# Patient Record
Sex: Female | Born: 1940 | Race: Black or African American | Hispanic: No | Smoking: Never smoker
Health system: Southern US, Community
[De-identification: ages and names within clinical notes are randomized; demographics above are authoritative.]

## PROBLEM LIST (undated history)

## (undated) DIAGNOSIS — K219 Gastro-esophageal reflux disease without esophagitis: Secondary | ICD-10-CM

## (undated) DIAGNOSIS — E785 Hyperlipidemia, unspecified: Secondary | ICD-10-CM

## (undated) DIAGNOSIS — I480 Paroxysmal atrial fibrillation: Secondary | ICD-10-CM

## (undated) DIAGNOSIS — M1990 Unspecified osteoarthritis, unspecified site: Secondary | ICD-10-CM

## (undated) DIAGNOSIS — G479 Sleep disorder, unspecified: Secondary | ICD-10-CM

## (undated) DIAGNOSIS — R569 Unspecified convulsions: Secondary | ICD-10-CM

## (undated) DIAGNOSIS — I10 Essential (primary) hypertension: Secondary | ICD-10-CM

## (undated) DIAGNOSIS — G709 Myoneural disorder, unspecified: Secondary | ICD-10-CM

## (undated) DIAGNOSIS — I639 Cerebral infarction, unspecified: Secondary | ICD-10-CM

## (undated) HISTORY — PX: LYMPH NODE BIOPSY: SHX201

## (undated) HISTORY — DX: Unspecified convulsions: R56.9

---

## 2012-11-22 DIAGNOSIS — K6389 Other specified diseases of intestine: Secondary | ICD-10-CM | POA: Diagnosis not present

## 2013-07-20 DIAGNOSIS — E119 Type 2 diabetes mellitus without complications: Secondary | ICD-10-CM | POA: Diagnosis not present

## 2013-07-20 DIAGNOSIS — E785 Hyperlipidemia, unspecified: Secondary | ICD-10-CM | POA: Diagnosis not present

## 2013-07-20 DIAGNOSIS — R5381 Other malaise: Secondary | ICD-10-CM | POA: Diagnosis not present

## 2013-07-20 DIAGNOSIS — E559 Vitamin D deficiency, unspecified: Secondary | ICD-10-CM | POA: Diagnosis not present

## 2013-07-20 DIAGNOSIS — Z23 Encounter for immunization: Secondary | ICD-10-CM | POA: Diagnosis not present

## 2013-07-20 DIAGNOSIS — R7402 Elevation of levels of lactic acid dehydrogenase (LDH): Secondary | ICD-10-CM | POA: Diagnosis not present

## 2013-07-20 DIAGNOSIS — M1990 Unspecified osteoarthritis, unspecified site: Secondary | ICD-10-CM | POA: Diagnosis not present

## 2013-07-20 DIAGNOSIS — E7800 Pure hypercholesterolemia, unspecified: Secondary | ICD-10-CM | POA: Diagnosis not present

## 2013-07-20 DIAGNOSIS — N390 Urinary tract infection, site not specified: Secondary | ICD-10-CM | POA: Diagnosis not present

## 2013-07-20 DIAGNOSIS — I10 Essential (primary) hypertension: Secondary | ICD-10-CM | POA: Diagnosis not present

## 2013-07-20 DIAGNOSIS — D649 Anemia, unspecified: Secondary | ICD-10-CM | POA: Diagnosis not present

## 2013-07-20 DIAGNOSIS — M25559 Pain in unspecified hip: Secondary | ICD-10-CM | POA: Diagnosis not present

## 2013-08-02 DIAGNOSIS — H6690 Otitis media, unspecified, unspecified ear: Secondary | ICD-10-CM | POA: Diagnosis not present

## 2013-08-02 DIAGNOSIS — R5381 Other malaise: Secondary | ICD-10-CM | POA: Diagnosis not present

## 2013-08-02 DIAGNOSIS — I10 Essential (primary) hypertension: Secondary | ICD-10-CM | POA: Diagnosis not present

## 2013-08-02 DIAGNOSIS — E782 Mixed hyperlipidemia: Secondary | ICD-10-CM | POA: Diagnosis not present

## 2013-08-02 DIAGNOSIS — H6120 Impacted cerumen, unspecified ear: Secondary | ICD-10-CM | POA: Diagnosis not present

## 2013-08-31 DIAGNOSIS — Z1231 Encounter for screening mammogram for malignant neoplasm of breast: Secondary | ICD-10-CM | POA: Diagnosis not present

## 2013-09-04 DIAGNOSIS — N6009 Solitary cyst of unspecified breast: Secondary | ICD-10-CM | POA: Diagnosis not present

## 2013-09-04 DIAGNOSIS — N63 Unspecified lump in unspecified breast: Secondary | ICD-10-CM | POA: Diagnosis not present

## 2014-03-11 DIAGNOSIS — Z13220 Encounter for screening for lipoid disorders: Secondary | ICD-10-CM | POA: Diagnosis not present

## 2014-03-11 DIAGNOSIS — M1610 Unilateral primary osteoarthritis, unspecified hip: Secondary | ICD-10-CM | POA: Diagnosis not present

## 2014-03-11 DIAGNOSIS — Z131 Encounter for screening for diabetes mellitus: Secondary | ICD-10-CM | POA: Diagnosis not present

## 2014-03-11 DIAGNOSIS — I10 Essential (primary) hypertension: Secondary | ICD-10-CM | POA: Diagnosis not present

## 2014-03-11 DIAGNOSIS — E785 Hyperlipidemia, unspecified: Secondary | ICD-10-CM | POA: Diagnosis not present

## 2014-06-10 DIAGNOSIS — M1610 Unilateral primary osteoarthritis, unspecified hip: Secondary | ICD-10-CM | POA: Diagnosis not present

## 2014-06-10 DIAGNOSIS — J3089 Other allergic rhinitis: Secondary | ICD-10-CM | POA: Diagnosis not present

## 2014-06-10 DIAGNOSIS — Z1239 Encounter for other screening for malignant neoplasm of breast: Secondary | ICD-10-CM | POA: Diagnosis not present

## 2014-06-10 DIAGNOSIS — I10 Essential (primary) hypertension: Secondary | ICD-10-CM | POA: Diagnosis not present

## 2014-06-17 ENCOUNTER — Other Ambulatory Visit: Payer: Self-pay

## 2014-06-17 DIAGNOSIS — Z1231 Encounter for screening mammogram for malignant neoplasm of breast: Secondary | ICD-10-CM

## 2014-09-06 DIAGNOSIS — Z23 Encounter for immunization: Secondary | ICD-10-CM | POA: Diagnosis not present

## 2014-10-08 ENCOUNTER — Ambulatory Visit
Admission: RE | Admit: 2014-10-08 | Discharge: 2014-10-08 | Disposition: A | Discharge status: Home / Self Care | Payer: Medicare Other | Source: Ambulatory Visit

## 2014-10-08 DIAGNOSIS — Z1231 Encounter for screening mammogram for malignant neoplasm of breast: Secondary | ICD-10-CM

## 2014-11-13 DIAGNOSIS — I10 Essential (primary) hypertension: Secondary | ICD-10-CM | POA: Diagnosis not present

## 2014-11-13 DIAGNOSIS — M1611 Unilateral primary osteoarthritis, right hip: Secondary | ICD-10-CM | POA: Diagnosis not present

## 2014-12-18 DIAGNOSIS — H8113 Benign paroxysmal vertigo, bilateral: Secondary | ICD-10-CM | POA: Diagnosis not present

## 2014-12-18 DIAGNOSIS — I10 Essential (primary) hypertension: Secondary | ICD-10-CM | POA: Diagnosis not present

## 2014-12-18 DIAGNOSIS — M1611 Unilateral primary osteoarthritis, right hip: Secondary | ICD-10-CM | POA: Diagnosis not present

## 2015-03-19 DIAGNOSIS — I10 Essential (primary) hypertension: Secondary | ICD-10-CM | POA: Diagnosis not present

## 2015-03-19 DIAGNOSIS — Z131 Encounter for screening for diabetes mellitus: Secondary | ICD-10-CM | POA: Diagnosis not present

## 2015-03-19 DIAGNOSIS — E784 Other hyperlipidemia: Secondary | ICD-10-CM | POA: Diagnosis not present

## 2015-03-19 DIAGNOSIS — J019 Acute sinusitis, unspecified: Secondary | ICD-10-CM | POA: Diagnosis not present

## 2015-03-19 DIAGNOSIS — M1611 Unilateral primary osteoarthritis, right hip: Secondary | ICD-10-CM | POA: Diagnosis not present

## 2015-03-31 DIAGNOSIS — M25511 Pain in right shoulder: Secondary | ICD-10-CM | POA: Diagnosis not present

## 2015-03-31 DIAGNOSIS — M25551 Pain in right hip: Secondary | ICD-10-CM | POA: Diagnosis not present

## 2015-03-31 DIAGNOSIS — M25561 Pain in right knee: Secondary | ICD-10-CM | POA: Diagnosis not present

## 2015-04-09 DIAGNOSIS — M25551 Pain in right hip: Secondary | ICD-10-CM | POA: Diagnosis not present

## 2015-07-10 DIAGNOSIS — M25511 Pain in right shoulder: Secondary | ICD-10-CM | POA: Diagnosis not present

## 2015-07-10 DIAGNOSIS — M25561 Pain in right knee: Secondary | ICD-10-CM | POA: Diagnosis not present

## 2015-07-10 DIAGNOSIS — M25551 Pain in right hip: Secondary | ICD-10-CM | POA: Diagnosis not present

## 2015-07-18 DIAGNOSIS — E784 Other hyperlipidemia: Secondary | ICD-10-CM | POA: Diagnosis not present

## 2015-07-18 DIAGNOSIS — I10 Essential (primary) hypertension: Secondary | ICD-10-CM | POA: Diagnosis not present

## 2015-07-18 DIAGNOSIS — M1611 Unilateral primary osteoarthritis, right hip: Secondary | ICD-10-CM | POA: Diagnosis not present

## 2015-08-05 NOTE — Progress Notes (Signed)
Requested release of orders in Epic surgery 08-22-15 pre op 08-14-15 Thanks

## 2015-08-12 NOTE — Patient Instructions (Addendum)
YOUR PROCEDURE IS SCHEDULED ON : 08/22/15  REPORT TO [facility] MAIN ENTRANCE FOLLOW SIGNS TO EAST ELEVATOR - GO TO 3rd FLOOR CHECK IN AT 3 EAST NURSES STATION (SHORT STAY) AT:  10:45 AM  CALL THIS NUMBER IF YOU HAVE PROBLEMS THE MORNING OF SURGERY [phone]  REMEMBER:ONLY 1 PER PERSON MAY GO TO SHORT STAY WITH YOU TO GET READY THE MORNING OF YOUR SURGERY  DO NOT EAT FOOD OR DRINK LIQUIDS AFTER MIDNIGHT  TAKE THESE MEDICINES THE MORNING OF SURGERY:CETRIZINE  YOU MAY NOT HAVE ANY METAL ON YOUR BODY INCLUDING HAIR PINS AND PIERCING'S. DO NOT WEAR JEWELRY, MAKEUP, LOTIONS, POWDERS OR PERFUMES. DO NOT WEAR NAIL POLISH. DO NOT SHAVE 48 HRS PRIOR TO SURGERY. MEN MAY SHAVE FACE AND NECK.  DO NOT BRING VALUABLES TO HOSPITAL. [facility] IS NOT RESPONSIBLE FOR VALUABLES.  CONTACTS, DENTURES OR PARTIALS MAY NOT BE WORN TO SURGERY. LEAVE SUITCASE IN CAR. CAN BE BROUGHT TO ROOM AFTER SURGERY.  PATIENTS DISCHARGED THE DAY OF SURGERY WILL NOT BE ALLOWED TO DRIVE HOME.  PLEASE READ OVER THE FOLLOWING INSTRUCTION SHEETS _________________________________________________________________________________                                          [place] - PREPARING FOR SURGERY  Before surgery, you can play an important role.  Because skin is not sterile, your skin needs to be as free of germs as possible.  You can reduce the number of germs on your skin by washing with CHG (chlorahexidine gluconate) soap before surgery.  CHG is an antiseptic cleaner which kills germs and bonds with the skin to continue killing germs even after washing. Please DO NOT use if you have an allergy to CHG or antibacterial soaps.  If your skin becomes reddened/irritated stop using the CHG and inform your nurse when you arrive at Short Stay. Do not shave (including legs and underarms) for at least 48 hours prior to the first CHG shower.  You may shave your face. Please follow these instructions  carefully:   1.  Shower with CHG Soap the night before surgery and the  morning of Surgery.   2.  If you choose to wash your hair, wash your hair first as usual with your  normal  Shampoo.   3.  After you shampoo, rinse your hair and body thoroughly to remove the  shampoo.                                         4.  Use CHG as you would any other liquid soap.  You can apply chg directly  to the skin and wash . Gently wash with scrungie or clean wascloth    5.  Apply the CHG Soap to your body ONLY FROM THE NECK DOWN.   Do not use on open                           Wound or open sores. Avoid contact with eyes, ears mouth and genitals (private parts).                        Genitals (private parts) with your normal soap.  6.  Wash thoroughly, paying special attention to the area where your surgery  will be performed.   7.  Thoroughly rinse your body with warm water from the neck down.   8.  DO NOT shower/wash with your normal soap after using and rinsing off  the CHG Soap .                9.  Pat yourself dry with a clean towel.             10.  Wear clean night clothes to bed after shower             11.  Place clean sheets on your bed the night of your first shower and do not  sleep with pets.  Day of Surgery : Do not apply any lotions/deodorants the morning of surgery.  Please wear clean clothes to the hospital/surgery center.  FAILURE TO FOLLOW THESE INSTRUCTIONS MAY RESULT IN THE CANCELLATION OF YOUR SURGERY    PATIENT SIGNATURE_________________________________  ______________________________________________________________________

## 2015-08-14 ENCOUNTER — Encounter (HOSPITAL_COMMUNITY)
Admission: RE | Admit: 2015-08-14 | Discharge: 2015-08-14 | Disposition: A | Discharge status: Home / Self Care | Payer: Medicare Other | Source: Ambulatory Visit | Attending: Orthopaedic Surgery | Admitting: Orthopaedic Surgery

## 2015-08-14 ENCOUNTER — Encounter (HOSPITAL_COMMUNITY): Payer: Self-pay

## 2015-08-14 DIAGNOSIS — Z01818 Encounter for other preprocedural examination: Secondary | ICD-10-CM | POA: Insufficient documentation

## 2015-08-14 DIAGNOSIS — M1611 Unilateral primary osteoarthritis, right hip: Secondary | ICD-10-CM | POA: Diagnosis not present

## 2015-08-14 HISTORY — DX: Gastro-esophageal reflux disease without esophagitis: K21.9

## 2015-08-14 HISTORY — DX: Sleep disorder, unspecified: G47.9

## 2015-08-14 HISTORY — DX: Hyperlipidemia, unspecified: E78.5

## 2015-08-14 HISTORY — DX: Essential (primary) hypertension: I10

## 2015-08-14 HISTORY — DX: Myoneural disorder, unspecified: G70.9

## 2015-08-14 LAB — BASIC METABOLIC PANEL
Anion gap: 7 (ref 5–15)
BUN: 26 mg/dL — AB (ref 6–20)
CO2: 28 mmol/L (ref 22–32)
CREATININE: 0.79 mg/dL (ref 0.44–1.00)
Calcium: 9.6 mg/dL (ref 8.9–10.3)
Chloride: 103 mmol/L (ref 101–111)
GFR calc Af Amer: >60 mL/min (ref >60)
GLUCOSE: 89 mg/dL (ref 65–99)
POTASSIUM: 3.9 mmol/L (ref 3.5–5.1)
SODIUM: 138 mmol/L (ref 135–145)

## 2015-08-14 LAB — CBC
HEMATOCRIT: 38.8 % (ref 36.0–46.0)
Hemoglobin: 12.2 g/dL (ref 12.0–15.0)
MCH: 27.7 pg (ref 26.0–34.0)
MCHC: 31.4 g/dL (ref 30.0–36.0)
MCV: 88.2 fL (ref 78.0–100.0)
PLATELETS: 205 10*3/uL (ref 150–400)
RBC: 4.4 MIL/uL (ref 3.87–5.11)
RDW: 13.7 % (ref 11.5–15.5)
WBC: 4.2 10*3/uL (ref 4.0–10.5)

## 2015-08-14 LAB — APTT: aPTT: 28 seconds (ref 24–37)

## 2015-08-14 LAB — PROTIME-INR
INR: 1.04 (ref 0.00–1.49)
Prothrombin Time: 13.8 seconds (ref 11.6–15.2)

## 2015-08-14 LAB — SURGICAL PCR SCREEN
MRSA, PCR: NEGATIVE
STAPHYLOCOCCUS AUREUS: NEGATIVE

## 2015-08-14 LAB — ABO/RH: ABO/RH(D): O POS

## 2015-08-22 ENCOUNTER — Encounter (HOSPITAL_COMMUNITY)
Admission: RE | Disposition: A | Discharge status: Skilled Nursing Facility | Payer: Self-pay | Source: Ambulatory Visit | Attending: Orthopaedic Surgery

## 2015-08-22 ENCOUNTER — Inpatient Hospital Stay (HOSPITAL_COMMUNITY): Payer: Medicare Other

## 2015-08-22 ENCOUNTER — Inpatient Hospital Stay (HOSPITAL_COMMUNITY): Payer: Medicare Other | Admitting: Certified Registered Nurse Anesthetist

## 2015-08-22 ENCOUNTER — Encounter (HOSPITAL_COMMUNITY): Payer: Self-pay | Admitting: *Deleted

## 2015-08-22 ENCOUNTER — Inpatient Hospital Stay (HOSPITAL_COMMUNITY)
Admission: RE | Admit: 2015-08-22 | Discharge: 2015-08-25 | DRG: 470 | Disposition: A | Discharge status: Skilled Nursing Facility | Payer: Medicare Other | Source: Ambulatory Visit | Attending: Orthopaedic Surgery | Admitting: Orthopaedic Surgery

## 2015-08-22 DIAGNOSIS — D62 Acute posthemorrhagic anemia: Secondary | ICD-10-CM | POA: Diagnosis not present

## 2015-08-22 DIAGNOSIS — I10 Essential (primary) hypertension: Secondary | ICD-10-CM | POA: Diagnosis present

## 2015-08-22 DIAGNOSIS — M1611 Unilateral primary osteoarthritis, right hip: Principal | ICD-10-CM

## 2015-08-22 DIAGNOSIS — Z96641 Presence of right artificial hip joint: Secondary | ICD-10-CM | POA: Diagnosis not present

## 2015-08-22 DIAGNOSIS — K219 Gastro-esophageal reflux disease without esophagitis: Secondary | ICD-10-CM | POA: Diagnosis present

## 2015-08-22 DIAGNOSIS — Z4789 Encounter for other orthopedic aftercare: Secondary | ICD-10-CM | POA: Diagnosis not present

## 2015-08-22 DIAGNOSIS — G709 Myoneural disorder, unspecified: Secondary | ICD-10-CM | POA: Diagnosis not present

## 2015-08-22 DIAGNOSIS — Z9889 Other specified postprocedural states: Secondary | ICD-10-CM | POA: Diagnosis not present

## 2015-08-22 DIAGNOSIS — E785 Hyperlipidemia, unspecified: Secondary | ICD-10-CM | POA: Diagnosis present

## 2015-08-22 DIAGNOSIS — R2681 Unsteadiness on feet: Secondary | ICD-10-CM | POA: Diagnosis not present

## 2015-08-22 DIAGNOSIS — E784 Other hyperlipidemia: Secondary | ICD-10-CM | POA: Diagnosis not present

## 2015-08-22 DIAGNOSIS — M6281 Muscle weakness (generalized): Secondary | ICD-10-CM | POA: Diagnosis not present

## 2015-08-22 DIAGNOSIS — Z01812 Encounter for preprocedural laboratory examination: Secondary | ICD-10-CM

## 2015-08-22 DIAGNOSIS — Z419 Encounter for procedure for purposes other than remedying health state, unspecified: Secondary | ICD-10-CM

## 2015-08-22 DIAGNOSIS — M25551 Pain in right hip: Secondary | ICD-10-CM | POA: Diagnosis not present

## 2015-08-22 DIAGNOSIS — M169 Osteoarthritis of hip, unspecified: Secondary | ICD-10-CM | POA: Diagnosis not present

## 2015-08-22 DIAGNOSIS — G4700 Insomnia, unspecified: Secondary | ICD-10-CM | POA: Diagnosis not present

## 2015-08-22 HISTORY — PX: TOTAL HIP ARTHROPLASTY: SHX124

## 2015-08-22 LAB — TYPE AND SCREEN
ABO/RH(D): O POS
ANTIBODY SCREEN: NEGATIVE

## 2015-08-22 SURGERY — ARTHROPLASTY, HIP, TOTAL, ANTERIOR APPROACH
Anesthesia: General | Site: Hip | Laterality: Right

## 2015-08-22 MED ORDER — LACTATED RINGERS IV SOLN
INTRAVENOUS | Status: DC
  Administered 2015-08-22: 1000 mL via INTRAVENOUS
  Administered 2015-08-22: 14:00:00 via INTRAVENOUS

## 2015-08-22 MED ORDER — MECLIZINE HCL 25 MG PO TABS
25.0000 mg | ORAL_TABLET | Freq: Three times a day (TID) | ORAL | Status: DC | PRN
  Filled 2015-08-22: qty 1

## 2015-08-22 MED ORDER — GLYCOPYRROLATE 0.2 MG/ML IJ SOLN
INTRAMUSCULAR | Status: DC | PRN
  Administered 2015-08-22: 0.4 mg via INTRAVENOUS

## 2015-08-22 MED ORDER — NEOSTIGMINE METHYLSULFATE 10 MG/10ML IV SOLN
INTRAVENOUS | Status: DC | PRN
  Administered 2015-08-22: 3 mg via INTRAVENOUS

## 2015-08-22 MED ORDER — LIDOCAINE HCL (CARDIAC) 20 MG/ML IV SOLN
INTRAVENOUS | Status: AC
  Filled 2015-08-22: qty 5

## 2015-08-22 MED ORDER — ZOLPIDEM TARTRATE 5 MG PO TABS: 5.0000 mg | ORAL_TABLET | Freq: Every evening | ORAL | Status: DC | PRN

## 2015-08-22 MED ORDER — METHOCARBAMOL 1000 MG/10ML IJ SOLN
500.0000 mg | Freq: Four times a day (QID) | INTRAVENOUS | Status: DC | PRN
  Administered 2015-08-22: 500 mg via INTRAVENOUS
  Filled 2015-08-22 (×2): qty 5

## 2015-08-22 MED ORDER — HYDROMORPHONE HCL 1 MG/ML IJ SOLN
0.2500 mg | INTRAMUSCULAR | Status: DC | PRN
  Administered 2015-08-22 (×4): 0.5 mg via INTRAVENOUS

## 2015-08-22 MED ORDER — METHOCARBAMOL 500 MG PO TABS
500.0000 mg | ORAL_TABLET | Freq: Four times a day (QID) | ORAL | Status: DC | PRN
  Administered 2015-08-24 – 2015-08-25 (×4): 500 mg via ORAL
  Filled 2015-08-22 (×5): qty 1

## 2015-08-22 MED ORDER — OXYCODONE HCL 5 MG PO TABS
5.0000 mg | ORAL_TABLET | ORAL | Status: DC | PRN
  Administered 2015-08-23 – 2015-08-25 (×7): 10 mg via ORAL
  Filled 2015-08-22 (×9): qty 2

## 2015-08-22 MED ORDER — DOCUSATE SODIUM 100 MG PO CAPS
100.0000 mg | ORAL_CAPSULE | Freq: Two times a day (BID) | ORAL | Status: DC
  Administered 2015-08-22 – 2015-08-25 (×6): 100 mg via ORAL

## 2015-08-22 MED ORDER — 0.9 % SODIUM CHLORIDE (POUR BTL) OPTIME
TOPICAL | Status: DC | PRN
  Administered 2015-08-22: 1000 mL

## 2015-08-22 MED ORDER — ALUM & MAG HYDROXIDE-SIMETH 200-200-20 MG/5ML PO SUSP
30.0000 mL | ORAL | Status: DC | PRN
  Administered 2015-08-24: 30 mL via ORAL
  Filled 2015-08-22: qty 30

## 2015-08-22 MED ORDER — ONDANSETRON HCL 4 MG/2ML IJ SOLN
4.0000 mg | Freq: Four times a day (QID) | INTRAMUSCULAR | Status: DC | PRN
  Administered 2015-08-25: 4 mg via INTRAVENOUS
  Filled 2015-08-22 (×2): qty 2

## 2015-08-22 MED ORDER — OXYCODONE HCL 5 MG PO TABS: 5.0000 mg | ORAL_TABLET | Freq: Once | ORAL | Status: DC | PRN

## 2015-08-22 MED ORDER — EPHEDRINE SULFATE 50 MG/ML IJ SOLN
INTRAMUSCULAR | Status: AC
  Filled 2015-08-22: qty 1

## 2015-08-22 MED ORDER — ROCURONIUM BROMIDE 100 MG/10ML IV SOLN
INTRAVENOUS | Status: AC
  Filled 2015-08-22: qty 1

## 2015-08-22 MED ORDER — DEXAMETHASONE SODIUM PHOSPHATE 10 MG/ML IJ SOLN
INTRAMUSCULAR | Status: DC | PRN
  Administered 2015-08-22: 10 mg via INTRAVENOUS

## 2015-08-22 MED ORDER — ONDANSETRON HCL 4 MG/2ML IJ SOLN
INTRAMUSCULAR | Status: AC
  Filled 2015-08-22: qty 2

## 2015-08-22 MED ORDER — LOSARTAN POTASSIUM-HCTZ 100-25 MG PO TABS: 1.0000 | ORAL_TABLET | Freq: Every morning | ORAL | Status: DC

## 2015-08-22 MED ORDER — ACETAMINOPHEN 325 MG PO TABS
650.0000 mg | ORAL_TABLET | Freq: Four times a day (QID) | ORAL | Status: DC | PRN
  Administered 2015-08-25 (×2): 650 mg via ORAL
  Filled 2015-08-22 (×2): qty 2

## 2015-08-22 MED ORDER — KETOROLAC TROMETHAMINE 15 MG/ML IJ SOLN
7.5000 mg | Freq: Four times a day (QID) | INTRAMUSCULAR | Status: AC
  Administered 2015-08-22 – 2015-08-23 (×4): 7.5 mg via INTRAVENOUS
  Filled 2015-08-22 (×4): qty 1

## 2015-08-22 MED ORDER — METOCLOPRAMIDE HCL 10 MG PO TABS: 5.0000 mg | ORAL_TABLET | Freq: Three times a day (TID) | ORAL | Status: DC | PRN

## 2015-08-22 MED ORDER — EPHEDRINE SULFATE 50 MG/ML IJ SOLN
INTRAMUSCULAR | Status: DC | PRN
  Administered 2015-08-22 (×2): 10 mg via INTRAVENOUS

## 2015-08-22 MED ORDER — DEXAMETHASONE SODIUM PHOSPHATE 10 MG/ML IJ SOLN
INTRAMUSCULAR | Status: AC
  Filled 2015-08-22: qty 1

## 2015-08-22 MED ORDER — NEOSTIGMINE METHYLSULFATE 10 MG/10ML IV SOLN
INTRAVENOUS | Status: AC
  Filled 2015-08-22: qty 1

## 2015-08-22 MED ORDER — TRANEXAMIC ACID 1000 MG/10ML IV SOLN
1000.0000 mg | INTRAVENOUS | Status: AC
  Administered 2015-08-22: 1000 mg via INTRAVENOUS
  Filled 2015-08-22: qty 10

## 2015-08-22 MED ORDER — HYDROMORPHONE HCL 1 MG/ML IJ SOLN
1.0000 mg | INTRAMUSCULAR | Status: DC | PRN
Start: 2015-08-22 — End: 2015-08-25
  Administered 2015-08-22 – 2015-08-23 (×3): 1 mg via INTRAVENOUS
  Filled 2015-08-22 (×3): qty 1

## 2015-08-22 MED ORDER — SODIUM CHLORIDE 0.9 % IV SOLN
INTRAVENOUS | Status: DC
  Administered 2015-08-22 – 2015-08-23 (×2): via INTRAVENOUS

## 2015-08-22 MED ORDER — ONDANSETRON HCL 4 MG PO TABS: 4.0000 mg | ORAL_TABLET | Freq: Four times a day (QID) | ORAL | Status: DC | PRN

## 2015-08-22 MED ORDER — VITAMIN E 180 MG (400 UNIT) PO CAPS
400.0000 [IU] | ORAL_CAPSULE | Freq: Every day | ORAL | Status: DC
  Administered 2015-08-22 – 2015-08-25 (×4): 400 [IU] via ORAL
  Filled 2015-08-22 (×4): qty 1

## 2015-08-22 MED ORDER — FENTANYL CITRATE (PF) 100 MCG/2ML IJ SOLN
INTRAMUSCULAR | Status: DC | PRN
Start: 2015-08-22 — End: 2015-08-22
  Administered 2015-08-22: 50 ug via INTRAVENOUS
  Administered 2015-08-22 (×2): 100 ug via INTRAVENOUS

## 2015-08-22 MED ORDER — SUCCINYLCHOLINE CHLORIDE 20 MG/ML IJ SOLN
INTRAMUSCULAR | Status: DC | PRN
  Administered 2015-08-22: 100 mg via INTRAVENOUS

## 2015-08-22 MED ORDER — CEFAZOLIN SODIUM-DEXTROSE 2-3 GM-% IV SOLR
INTRAVENOUS | Status: AC
  Filled 2015-08-22: qty 50

## 2015-08-22 MED ORDER — CEFAZOLIN SODIUM-DEXTROSE 2-3 GM-% IV SOLR
2.0000 g | INTRAVENOUS | Status: AC
  Administered 2015-08-22: 2 g via INTRAVENOUS

## 2015-08-22 MED ORDER — SODIUM CHLORIDE 0.9 % IR SOLN
Status: DC | PRN
  Administered 2015-08-22: 1000 mL

## 2015-08-22 MED ORDER — PROPOFOL 10 MG/ML IV BOLUS
INTRAVENOUS | Status: DC | PRN
  Administered 2015-08-22: 150 mg via INTRAVENOUS

## 2015-08-22 MED ORDER — ONDANSETRON HCL 4 MG/2ML IJ SOLN: 4.0000 mg | Freq: Once | INTRAMUSCULAR | Status: DC | PRN

## 2015-08-22 MED ORDER — DIPHENHYDRAMINE HCL 12.5 MG/5ML PO ELIX
12.5000 mg | ORAL_SOLUTION | ORAL | Status: DC | PRN
  Administered 2015-08-22 – 2015-08-25 (×2): 12.5 mg via ORAL
  Filled 2015-08-22 (×2): qty 5

## 2015-08-22 MED ORDER — FENTANYL CITRATE (PF) 250 MCG/5ML IJ SOLN
INTRAMUSCULAR | Status: AC
  Filled 2015-08-22: qty 25

## 2015-08-22 MED ORDER — HYDROMORPHONE HCL 1 MG/ML IJ SOLN
INTRAMUSCULAR | Status: DC | PRN
  Administered 2015-08-22 (×2): 1 mg via INTRAVENOUS

## 2015-08-22 MED ORDER — OXYCODONE HCL 5 MG/5ML PO SOLN
5.0000 mg | Freq: Once | ORAL | Status: DC | PRN
  Filled 2015-08-22: qty 5

## 2015-08-22 MED ORDER — SODIUM CHLORIDE 0.9 % IJ SOLN
INTRAMUSCULAR | Status: AC
  Filled 2015-08-22: qty 10

## 2015-08-22 MED ORDER — GLYCOPYRROLATE 0.2 MG/ML IJ SOLN
INTRAMUSCULAR | Status: AC
  Filled 2015-08-22: qty 2

## 2015-08-22 MED ORDER — CEFAZOLIN SODIUM 1-5 GM-% IV SOLN
1.0000 g | Freq: Four times a day (QID) | INTRAVENOUS | Status: AC
  Administered 2015-08-22 (×2): 1 g via INTRAVENOUS
  Filled 2015-08-22 (×2): qty 50

## 2015-08-22 MED ORDER — ACETAMINOPHEN 650 MG RE SUPP: 650.0000 mg | Freq: Four times a day (QID) | RECTAL | Status: DC | PRN

## 2015-08-22 MED ORDER — PROPOFOL 10 MG/ML IV BOLUS
INTRAVENOUS | Status: AC
  Filled 2015-08-22: qty 20

## 2015-08-22 MED ORDER — METOCLOPRAMIDE HCL 5 MG/ML IJ SOLN: 5.0000 mg | Freq: Three times a day (TID) | INTRAMUSCULAR | Status: DC | PRN

## 2015-08-22 MED ORDER — ONDANSETRON HCL 4 MG/2ML IJ SOLN
INTRAMUSCULAR | Status: DC | PRN
  Administered 2015-08-22: 4 mg via INTRAVENOUS

## 2015-08-22 MED ORDER — HYDROMORPHONE HCL 2 MG/ML IJ SOLN
INTRAMUSCULAR | Status: AC
  Filled 2015-08-22: qty 1

## 2015-08-22 MED ORDER — LOSARTAN POTASSIUM 50 MG PO TABS
100.0000 mg | ORAL_TABLET | Freq: Every day | ORAL | Status: DC
  Administered 2015-08-22 – 2015-08-25 (×4): 100 mg via ORAL
  Filled 2015-08-22 (×4): qty 2

## 2015-08-22 MED ORDER — MENTHOL 3 MG MT LOZG: 1.0000 | LOZENGE | OROMUCOSAL | Status: DC | PRN

## 2015-08-22 MED ORDER — LIDOCAINE HCL (CARDIAC) 20 MG/ML IV SOLN
INTRAVENOUS | Status: DC | PRN
  Administered 2015-08-22: 100 mg via INTRAVENOUS

## 2015-08-22 MED ORDER — HYDROMORPHONE HCL 1 MG/ML IJ SOLN
INTRAMUSCULAR | Status: AC
  Filled 2015-08-22: qty 1

## 2015-08-22 MED ORDER — POLYETHYLENE GLYCOL 3350 17 G PO PACK
17.0000 g | PACK | Freq: Every day | ORAL | Status: DC | PRN
  Administered 2015-08-24: 17 g via ORAL
  Filled 2015-08-22: qty 1

## 2015-08-22 MED ORDER — INFLUENZA VAC SPLIT QUAD 0.5 ML IM SUSY
0.5000 mL | PREFILLED_SYRINGE | INTRAMUSCULAR | Status: AC
  Administered 2015-08-23: 0.5 mL via INTRAMUSCULAR
  Filled 2015-08-22 (×2): qty 0.5

## 2015-08-22 MED ORDER — HYDROCHLOROTHIAZIDE 25 MG PO TABS
25.0000 mg | ORAL_TABLET | Freq: Every day | ORAL | Status: DC
  Administered 2015-08-22 – 2015-08-25 (×4): 25 mg via ORAL
  Filled 2015-08-22 (×4): qty 1

## 2015-08-22 MED ORDER — ROCURONIUM BROMIDE 100 MG/10ML IV SOLN
INTRAVENOUS | Status: DC | PRN
  Administered 2015-08-22: 30 mg via INTRAVENOUS

## 2015-08-22 MED ORDER — PHENOL 1.4 % MT LIQD
1.0000 | OROMUCOSAL | Status: DC | PRN
  Filled 2015-08-22: qty 177

## 2015-08-22 MED ORDER — VITAMIN D3 25 MCG (1000 UNIT) PO TABS
1000.0000 [IU] | ORAL_TABLET | Freq: Every day | ORAL | Status: DC
  Administered 2015-08-22 – 2015-08-25 (×4): 1000 [IU] via ORAL
  Filled 2015-08-22 (×4): qty 1

## 2015-08-22 MED ORDER — ASPIRIN EC 325 MG PO TBEC
325.0000 mg | DELAYED_RELEASE_TABLET | Freq: Two times a day (BID) | ORAL | Status: DC
  Administered 2015-08-22 – 2015-08-25 (×6): 325 mg via ORAL
  Filled 2015-08-22 (×9): qty 1

## 2015-08-22 MED ORDER — HYDROMORPHONE HCL 1 MG/ML IJ SOLN
INTRAMUSCULAR | Status: AC
  Administered 2015-08-23: 1 mg via INTRAVENOUS
  Filled 2015-08-22: qty 1

## 2015-08-22 SURGICAL SUPPLY — 41 items
BAG ZIPLOCK 12X15 (MISCELLANEOUS) IMPLANT
BENZOIN TINCTURE PRP APPL 2/3 (GAUZE/BANDAGES/DRESSINGS) IMPLANT
BLADE SAW SGTL 18X1.27X75 (BLADE) ×2 IMPLANT
BLADE SAW SGTL 18X1.27X75MM (BLADE) ×1
CAPT HIP TOTAL 2 ×3 IMPLANT
CELLS DAT CNTRL 66122 CELL SVR (MISCELLANEOUS) ×1 IMPLANT
CLOSURE WOUND 1/2 X4 (GAUZE/BANDAGES/DRESSINGS)
COVER PERINEAL POST (MISCELLANEOUS) ×3 IMPLANT
DRAPE C-ARM 42X120 X-RAY (DRAPES) ×3 IMPLANT
DRAPE STERI IOBAN 125X83 (DRAPES) ×3 IMPLANT
DRAPE U-SHAPE 47X51 STRL (DRAPES) ×9 IMPLANT
DRSG AQUACEL AG ADV 3.5X10 (GAUZE/BANDAGES/DRESSINGS) ×3 IMPLANT
DURAPREP 26ML APPLICATOR (WOUND CARE) ×3 IMPLANT
ELECT BLADE TIP CTD 4 INCH (ELECTRODE) ×3 IMPLANT
ELECT REM PT RETURN 9FT ADLT (ELECTROSURGICAL) ×3
ELECTRODE REM PT RTRN 9FT ADLT (ELECTROSURGICAL) ×1 IMPLANT
FACESHIELD WRAPAROUND (MASK) ×12 IMPLANT
GAUZE XEROFORM 1X8 LF (GAUZE/BANDAGES/DRESSINGS) ×3 IMPLANT
GLOVE BIO SURGEON STRL SZ7.5 (GLOVE) ×3 IMPLANT
GLOVE BIOGEL PI IND STRL 8 (GLOVE) ×2 IMPLANT
GLOVE BIOGEL PI INDICATOR 8 (GLOVE) ×4
GLOVE ECLIPSE 8.0 STRL XLNG CF (GLOVE) ×3 IMPLANT
GOWN STRL REUS W/TWL XL LVL3 (GOWN DISPOSABLE) ×6 IMPLANT
HANDPIECE INTERPULSE COAX TIP (DISPOSABLE) ×2
KIT BASIN OR (CUSTOM PROCEDURE TRAY) ×3 IMPLANT
PACK TOTAL JOINT (CUSTOM PROCEDURE TRAY) ×3 IMPLANT
PEN SKIN MARKING BROAD (MISCELLANEOUS) ×3 IMPLANT
RTRCTR WOUND ALEXIS 18CM MED (MISCELLANEOUS) ×3
SET HNDPC FAN SPRY TIP SCT (DISPOSABLE) ×1 IMPLANT
STAPLER VISISTAT 35W (STAPLE) ×3 IMPLANT
STRIP CLOSURE SKIN 1/2X4 (GAUZE/BANDAGES/DRESSINGS) IMPLANT
SUT ETHIBOND NAB CT1 #1 30IN (SUTURE) ×3 IMPLANT
SUT MNCRL AB 4-0 PS2 18 (SUTURE) IMPLANT
SUT VIC AB 0 CT1 36 (SUTURE) ×3 IMPLANT
SUT VIC AB 1 CT1 36 (SUTURE) ×3 IMPLANT
SUT VIC AB 2-0 CT1 27 (SUTURE) ×4
SUT VIC AB 2-0 CT1 TAPERPNT 27 (SUTURE) ×2 IMPLANT
TOWEL OR 17X26 10 PK STRL BLUE (TOWEL DISPOSABLE) ×3 IMPLANT
TOWEL OR NON WOVEN STRL DISP B (DISPOSABLE) ×3 IMPLANT
TRAY FOLEY W/METER SILVER 14FR (SET/KITS/TRAYS/PACK) ×3 IMPLANT
YANKAUER SUCT BULB TIP 10FT TU (MISCELLANEOUS) ×3 IMPLANT

## 2015-08-22 NOTE — Anesthesia Postprocedure Evaluation (Signed)
  Anesthesia Post-op Note  Patient: Diana Bush  Procedure(s) Performed: Procedure(s) (LRB): RIGHT TOTAL HIP ARTHROPLASTY ANTERIOR APPROACH (Right)  Patient Location: PACU  Anesthesia Type: General  Level of Consciousness: awake and alert   Airway and Oxygen Therapy: Patient Spontanous Breathing  Post-op Pain: mild  Post-op Assessment: Post-op Vital signs reviewed, Patient's Cardiovascular Status Stable, Respiratory Function Stable, Patent Airway and No signs of Nausea or vomiting  Last Vitals:  Filed Vitals:   08/22/15 1515  BP: 158/78  Pulse: 56  Temp: 36.4 C  Resp: 12    Post-op Vital Signs: stable   Complications: No apparent anesthesia complications

## 2015-08-22 NOTE — Transfer of Care (Signed)
Immediate Anesthesia Transfer of Care Note  Patient: Diana ColonelMary Dolbow  Procedure(s) Performed: Procedure(s): RIGHT TOTAL HIP ARTHROPLASTY ANTERIOR APPROACH (Right)  Patient Location: PACU  Anesthesia Type:General  Level of Consciousness:  sedated, patient cooperative and responds to stimulation  Airway & Oxygen Therapy:Patient Spontanous Breathing and Patient connected to face mask oxgen  Post-op Assessment:  Report given to PACU RN and Post -op Vital signs reviewed and stable  Post vital signs:  Reviewed and stable  Last Vitals:  Filed Vitals:   08/22/15 1416  BP:   Pulse:   Temp: 36.4 C  Resp:     Complications: No apparent anesthesia complications

## 2015-08-22 NOTE — Anesthesia Procedure Notes (Signed)
Procedure Name: Intubation Date/Time: 08/22/2015 12:45 PM Performed by: Orest DikesPETERS, Arin Vanosdol J Pre-anesthesia Checklist: Patient identified, Emergency Drugs available, Suction available and Patient being monitored Patient Re-evaluated:Patient Re-evaluated prior to inductionOxygen Delivery Method: Circle System Utilized Preoxygenation: Pre-oxygenation with 100% oxygen Intubation Type: IV induction Ventilation: Mask ventilation without difficulty Laryngoscope Size: Mac and 4 Grade View: Grade I Tube type: Oral Tube size: 7.5 mm Number of attempts: 1 Airway Equipment and Method: Stylet and Oral airway Placement Confirmation: ETT inserted through vocal cords under direct vision,  positive ETCO2 and breath sounds checked- equal and bilateral Secured at: 21 cm Tube secured with: Tape Dental Injury: Teeth and Oropharynx as per pre-operative assessment

## 2015-08-22 NOTE — H&P (Signed)
TOTAL HIP ADMISSION H&P  Patient is admitted for right total hip arthroplasty.  Subjective:  Chief Complaint: right hip pain  HPI: Diana Bush, 74 y.o. female, has a history of pain and functional disability in the right hip(s) due to arthritis and patient has failed non-surgical conservative treatments for greater than 12 weeks to include NSAID's and/or analgesics, corticosteriod injections, flexibility and strengthening excercises, use of assistive devices, weight reduction as appropriate and activity modification.  Onset of symptoms was gradual starting 3 years ago with gradually worsening course since that time.The patient noted no past surgery on the right hip(s).  Patient currently rates pain in the right hip at 10 out of 10 with activity. Patient has night pain, worsening of pain with activity and weight bearing, trendelenberg gait, pain that interfers with activities of daily living and pain with passive range of motion. Patient has evidence of subchondral cysts, subchondral sclerosis, periarticular osteophytes and joint space narrowing by imaging studies. This condition presents safety issues increasing the risk of falls.  There is no current active infection.  Patient Active Problem List   Diagnosis Date Noted  . Osteoarthritis of right hip 08/22/2015   Past Medical History  Diagnosis Date  . Hypertension   . Borderline hyperlipidemia   . Neuromuscular disorder (HCC)   . GERD (gastroesophageal reflux disease)   . Difficulty sleeping     DUE TO PAIN    Past Surgical History  Procedure Laterality Date  . Lymph node biopsy      BENIGN    No prescriptions prior to admission   No Known Allergies  Social History  Substance Use Topics  . Smoking status: Never Smoker   . Smokeless tobacco: Not on file  . Alcohol Use: No    No family history on file.   Review of Systems  Musculoskeletal: Positive for joint pain.  All other systems reviewed and are  negative.   Objective:  Physical Exam  Constitutional: She is oriented to person, place, and time. She appears well-developed and well-nourished.  HENT:  Head: Normocephalic and atraumatic.  Eyes: EOM are normal. Pupils are equal, round, and reactive to light.  Neck: Normal range of motion. Neck supple.  Cardiovascular: Normal rate and regular rhythm.   Respiratory: Effort normal and breath sounds normal.  GI: Soft. Bowel sounds are normal.  Musculoskeletal:       Right hip: She exhibits decreased range of motion, decreased strength, tenderness, bony tenderness and crepitus.  Neurological: She is alert and oriented to person, place, and time.  Skin: Skin is warm and dry.  Psychiatric: She has a normal mood and affect.    Vital signs in last 24 hours:    Labs:   There is no height or weight on file to calculate BMI.   Imaging Review Plain radiographs demonstrate severe degenerative joint disease of the right hip(s). The bone quality appears to be good for age and reported activity level.  Assessment/Plan:  End stage arthritis, right hip(s)  The patient history, physical examination, clinical judgement of the provider and imaging studies are consistent with end stage degenerative joint disease of the right hip(s) and total hip arthroplasty is deemed medically necessary. The treatment options including medical management, injection therapy, arthroscopy and arthroplasty were discussed at length. The risks and benefits of total hip arthroplasty were presented and reviewed. The risks due to aseptic loosening, infection, stiffness, dislocation/subluxation,  thromboembolic complications and other imponderables were discussed.  The patient acknowledged the explanation, agreed to proceed  with the plan and consent was signed. Patient is being admitted for inpatient treatment for surgery, pain control, PT, OT, prophylactic antibiotics, VTE prophylaxis, progressive ambulation and ADL's and  discharge planning.The patient is planning to be discharged home with home health services

## 2015-08-22 NOTE — Anesthesia Preprocedure Evaluation (Addendum)
Anesthesia Evaluation  Patient identified by MRN, date of birth, ID band Patient awake    Reviewed: Allergy & Precautions, H&P , NPO status , Patient's Chart, lab work & pertinent test results  History of Anesthesia Complications Negative for: history of anesthetic complications  Airway Mallampati: II  TM Distance: >3 FB Neck ROM: full    Dental no notable dental hx.    Pulmonary neg pulmonary ROS,    Pulmonary exam normal breath sounds clear to auscultation       Cardiovascular hypertension, Pt. on medications Normal cardiovascular exam Rhythm:regular Rate:Normal     Neuro/Psych  Neuromuscular disease    GI/Hepatic Neg liver ROS, GERD  ,  Endo/Other  negative endocrine ROS  Renal/GU negative Renal ROS     Musculoskeletal  (+) Arthritis ,   Abdominal (+) + obese,   Peds  Hematology negative hematology ROS (+)   Anesthesia Other Findings   Reproductive/Obstetrics negative OB ROS                            Anesthesia Physical Anesthesia Plan  ASA: II  Anesthesia Plan: General   Post-op Pain Management:    Induction: Intravenous  Airway Management Planned: LMA  Additional Equipment:   Intra-op Plan:   Post-operative Plan: Extubation in OR  Informed Consent: I have reviewed the patients History and Physical, chart, labs and discussed the procedure including the risks, benefits and alternatives for the proposed anesthesia with the patient or authorized representative who has indicated his/her understanding and acceptance.   Dental Advisory Given  Plan Discussed with: Anesthesiologist, CRNA and Surgeon  Anesthesia Plan Comments: (Patient declines spinal with no desire to hear risks or benefits of the various anesthesia techniques)       Anesthesia Quick Evaluation

## 2015-08-22 NOTE — Brief Op Note (Signed)
08/22/2015  1:53 PM  PATIENT:  Janann ColonelMary Haglund  74 y.o. female  PRE-OPERATIVE DIAGNOSIS:  Severe osteoarthritis right hip  POST-OPERATIVE DIAGNOSIS:  Severe osteoarthritis right hip  PROCEDURE:  Procedure(s): RIGHT TOTAL HIP ARTHROPLASTY ANTERIOR APPROACH (Right)  SURGEON:  Surgeon(s) and Role:    * Kathryne Hitchhristopher Y Maricela Schreur, MD - Primary  PHYSICIAN ASSISTANT: Rexene EdisonGil Clark, PA-C  ANESTHESIA:   general  EBL:  Total I/O In: 1000 [I.V.:1000] Out: 550 [Urine:250; Blood:300]  BLOOD ADMINISTERED:none  DRAINS: none   LOCAL MEDICATIONS USED:  NONE  SPECIMEN:  No Specimen  DISPOSITION OF SPECIMEN:  N/A  COUNTS:  YES  TOURNIQUET:  * No tourniquets in log *  DICTATION: .Other Dictation: Dictation Number [id number]  PLAN OF CARE: Admit to inpatient   PATIENT DISPOSITION:  PACU - hemodynamically stable.   Delay start of Pharmacological VTE agent (>24hrs) due to surgical blood loss or risk of bleeding: no

## 2015-08-22 NOTE — Progress Notes (Signed)
Utilization review completed.  

## 2015-08-23 DIAGNOSIS — M1611 Unilateral primary osteoarthritis, right hip: Secondary | ICD-10-CM | POA: Diagnosis not present

## 2015-08-23 LAB — CBC
HCT: 30.1 % — ABNORMAL LOW (ref 36.0–46.0)
HEMOGLOBIN: 9.5 g/dL — AB (ref 12.0–15.0)
MCH: 27.4 pg (ref 26.0–34.0)
MCHC: 31.6 g/dL (ref 30.0–36.0)
MCV: 86.7 fL (ref 78.0–100.0)
Platelets: 173 10*3/uL (ref 150–400)
RBC: 3.47 MIL/uL — AB (ref 3.87–5.11)
RDW: 13.7 % (ref 11.5–15.5)
WBC: 8.4 10*3/uL (ref 4.0–10.5)

## 2015-08-23 LAB — BASIC METABOLIC PANEL WITH GFR
Anion gap: 5 (ref 5–15)
BUN: 19 mg/dL (ref 6–20)
CO2: 28 mmol/L (ref 22–32)
Calcium: 8.8 mg/dL — ABNORMAL LOW (ref 8.9–10.3)
Chloride: 104 mmol/L (ref 101–111)
Creatinine, Ser: 0.79 mg/dL (ref 0.44–1.00)
GFR calc Af Amer: >60 mL/min
GFR calc non Af Amer: >60 mL/min
Glucose, Bld: 130 mg/dL — ABNORMAL HIGH (ref 65–99)
Potassium: 4.1 mmol/L (ref 3.5–5.1)
Sodium: 137 mmol/L (ref 135–145)

## 2015-08-23 NOTE — Progress Notes (Signed)
Occupational Therapy Evaluation Patient Details Name: Diana Bush MRN: [id number] DOB: 03-09-1941 Today's Date: 08/23/2015    History of Present Illness R THR    Clinical Impression   Patient presents to OT with decreased ADL independence and safety s/p R THR. 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 OT will follow.    Follow Up Recommendations  SNF;Supervision/Assistance - 24 hour ; Home Health OT   Equipment Recommendations  None recommended by OT    Recommendations for Other Services PT consult     Precautions / Restrictions Precautions Precautions: Fall Restrictions Weight Bearing Restrictions: No Other Position/Activity Restrictions: WBAT      Mobility Bed Mobility              Transfers                 Balance                                            ADL Overall ADL's : Needs assistance/impaired Eating/Feeding: Independent;Sitting                                     General ADL Comments: Patient up in recliner eating lunch. She vomited x 1 during evaluation. RN made aware. Patient asked OT to return in a few minutes to work on OOB activities.      Vision     Perception     Praxis      Pertinent Vitals/Pain Pain Assessment: 0-10 Pain Score: 5  Pain Location: R hip Pain Descriptors / Indicators: Aching;Sore Pain Intervention(s): Limited activity within patient's tolerance;Monitored during session     Hand Dominance Right   Extremity/Trunk Assessment Upper Extremity Assessment Upper Extremity Assessment: Overall WFL for tasks assessed         Communication Communication Communication: No difficulties   Cognition Arousal/Alertness: Awake/alert Behavior During Therapy: WFL for tasks assessed/performed Overall Cognitive Status: Within Functional Limits for tasks assessed                      General Comments       Exercises      Shoulder Instructions      Home Living Family/patient expects to be discharged to:: Private residence Living Arrangements: Spouse/significant other Available Help at Discharge: Family Type of Home: House Home Access: Stairs to enter Secretary/administrator of Steps: 5 Entrance Stairs-Rails: Right;Left Home Layout: One level     Bathroom Shower/Tub: Producer, television/film/video: Handicapped height Bathroom Accessibility: Yes How Accessible: Accessible via walker Home Equipment: Cane - single point;Toilet riser;Bedside commode;Shower seat - built in   Additional Comments: Pt is at home with spouse who is disabled and not able to offer physical assist.  Pt's dtr states, "mom actually is looking after him right now"      Prior Functioning/Environment Level of Independence: Independent             OT Diagnosis: Generalized weakness;Acute pain   OT Problem List: Decreased strength;Decreased range of motion;Decreased activity tolerance;Decreased knowledge of use of DME or AE;Pain   OT Treatment/Interventions: Self-care/ADL training;DME and/or AE instruction;Therapeutic activities;Patient/family education    OT Goals(Current goals can be found in the  care plan section) Acute Rehab OT Goals Patient Stated Goal: decrease pain OT Goal Formulation: With patient Time For Goal Achievement: 09/06/15 Potential to Achieve Goals: Good  OT Frequency: Min 2X/week   Barriers to D/C: Other (comment) (patient is primary caregiver for her husband who is disabled)          Co-evaluation              End of Session Nurse Communication: Other (comment) (pt vomited x 1)  Activity Tolerance: Patient tolerated treatment well Patient left: in chair;with call bell/phone within reach   Time: 1215-1230 OT Time Calculation (min): 15 min Charges:  OT General Charges $OT Visit: 1 Procedure OT Evaluation $Initial OT Evaluation Tier I: 1  Procedure G-Codes:    [name] A 08/23/2015, 1:55 PM

## 2015-08-23 NOTE — Evaluation (Signed)
Physical Therapy Evaluation Patient Details Name: Diana Bush MRN: [id number] DOB: 1941/02/21 Today's Date: 08/23/2015   History of Present Illness  R THR   Clinical Impression  Pt s/p R THR presents with decreased R LE strength/ROM and post op pain limiting functional mobility.  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   Follow Up Recommendations Home health PT;SNF    Equipment Recommendations  Rolling walker with 5" wheels    Recommendations for Other Services OT consult     Precautions / Restrictions Precautions Precautions: Fall Restrictions Weight Bearing Restrictions: No Other Position/Activity Restrictions: WBAT      Mobility  Bed Mobility Overal bed mobility: Needs Assistance Bed Mobility: Supine to Sit     Supine to sit: Min assist;Mod assist     General bed mobility comments: cues for sequence and use of L LE to self assist  Transfers Overall transfer level: Needs assistance Equipment used: Rolling walker (2 wheeled) Transfers: Sit to/from Stand Sit to Stand: Min assist         General transfer comment: cues for LE management and use of UEs to self assist  Ambulation/Gait Ambulation/Gait assistance: Min assist;Mod assist Ambulation Distance (Feet): 56 Feet Assistive device: Rolling walker (2 wheeled) Gait Pattern/deviations: Step-to pattern;Decreased step length - right;Decreased step length - left;Shuffle;Trunk flexed Gait velocity: decr Gait velocity interpretation: Below normal speed for age/gender General Gait Details: cues for posture, position from RW and initial sequence  Stairs            Wheelchair Mobility    Modified Rankin (Stroke Patients Only)       Balance                                             Pertinent Vitals/Pain Pain Assessment: 0-10 Pain Score: 4  Pain Location: R  hip Pain Descriptors / Indicators: Aching;Sore Pain Intervention(s): Limited activity within patient's tolerance;Monitored during session;Premedicated before session;Ice applied    Home Living Family/patient expects to be discharged to:: Private residence Living Arrangements: Spouse/significant other Available Help at Discharge: Family Type of Home: House Home Access: Stairs to enter Entrance Stairs-Rails: Doctor, general practiceight;Left Entrance Stairs-Number of Steps: 5 Home Layout: One level Home Equipment: Cane - single point Additional Comments: Pt is at home with spouse who is disabled and not able to offer physical assist.  Pt's dtr states, "mom actually is looking after him right now"    Prior Function Level of Independence: Independent               Hand Dominance        Extremity/Trunk Assessment   Upper Extremity Assessment: Overall WFL for tasks assessed           Lower Extremity Assessment: RLE deficits/detail RLE Deficits / Details: 2+/5 strength at hip with AAROM at hip to 80 flex and 15 abd    Cervical / Trunk Assessment: Normal  Communication   Communication: No difficulties  Cognition Arousal/Alertness: Awake/alert Behavior During Therapy: WFL for tasks assessed/performed Overall Cognitive Status: Within Functional Limits for tasks assessed                      General Comments      Exercises Total Joint Exercises Ankle Circles/Pumps:  AROM;Both;15 reps;Supine Quad Sets: AROM;Both;10 reps;Supine Heel Slides: AAROM;Right;15 reps;Supine Hip ABduction/ADduction: AAROM;Right;10 reps;Supine      Assessment/Plan    PT Assessment Patient needs continued PT services  PT Diagnosis Difficulty walking   PT Problem List Decreased strength;Decreased range of motion;Decreased activity tolerance;Decreased mobility;Decreased knowledge of use of DME;Pain;Obesity;Decreased knowledge of precautions  PT Treatment Interventions DME instruction;Gait training;Stair  training;Functional mobility training;Therapeutic activities;Therapeutic exercise;Patient/family education   PT Goals (Current goals can be found in the Care Plan section) Acute Rehab PT Goals Patient Stated Goal: Walk without pain PT Goal Formulation: With patient Time For Goal Achievement: 08/29/15 Potential to Achieve Goals: Good    Frequency 7X/week   Barriers to discharge Decreased caregiver support Pt is caregiver for disabled spouse - per dtr    Co-evaluation               End of Session Equipment Utilized During Treatment: Gait belt Activity Tolerance: Patient tolerated treatment well Patient left: in chair;with call bell/phone within reach Nurse Communication: Mobility status         Time: 1610-9604 PT Time Calculation (min) (ACUTE ONLY): 32 min   Charges:   PT Evaluation $Initial PT Evaluation Tier I: 1 Procedure PT Treatments $Therapeutic Exercise: 8-22 mins   PT G Codes:        Madhavi Hamblen 09/05/15, 12:39 PM

## 2015-08-23 NOTE — Care Management Note (Signed)
Case Management Note  Patient Details  Name: Diana Bush MRN: [id number] Date of Birth: August 09, 1941  Subjective/Objective:     Right total hip arthroplasty                Action/Plan: [facility] pt [facility] Pt states she has 3n1 at home. Requesting RW for home. Contacted [facility] for RW for home.   Expected Discharge Date:  08/23/2015               Expected Discharge Plan:  Home w Home Health Services  In-House Referral:     Discharge planning Services     Post Acute Care Choice:  Home Health Choice offered to:  Patient  DME Arranged:  Walker rolling DME Agency:  [facility]  [facility]  Status of Service:  Completed, signed off  Medicare Important Message Given:    Date Medicare IM Given:    Medicare IM give by:    Date Additional Medicare IM Given:    Additional Medicare Important Message give by:     If discussed at Long Length of Stay Meetings, dates discussed:    Additional Comments:  Elliot CousinShavis, Shaylen Nephew Ellen, RN 08/23/2015, 11:07 AM

## 2015-08-23 NOTE — Progress Notes (Signed)
Physical Therapy Treatment Patient Details Name: Diana Bush MRN: [id number] DOB: 1941-02-23 Today's Date: 08/23/2015    History of Present Illness R THR     PT Comments      Follow Up Recommendations  Home health PT;SNF     Equipment Recommendations  Rolling walker with 5" wheels    Recommendations for Other Services OT consult     Precautions / Restrictions Precautions Precautions: Fall Restrictions Weight Bearing Restrictions: No Other Position/Activity Restrictions: WBAT    Mobility  Bed Mobility Overal bed mobility: Needs Assistance Bed Mobility: Sit to Supine       Sit to supine: Min assist;Mod assist   General bed mobility comments: cues for sequence and use of L LE to self assist  Transfers Overall transfer level: Needs assistance Equipment used: Rolling walker (2 wheeled) Transfers: Sit to/from Stand Sit to Stand: Min guard         General transfer comment: cues for LE management and use of UEs to self assist  Ambulation/Gait Ambulation/Gait assistance: Min assist Ambulation Distance (Feet): 88 Feet Assistive device: Rolling walker (2 wheeled) Gait Pattern/deviations: Step-to pattern;Decreased step length - right;Decreased step length - left;Shuffle;Trunk flexed Gait velocity: decr   General Gait Details: cues for posture, position from RW and initial sequence   Stairs            Wheelchair Mobility    Modified Rankin (Stroke Patients Only)       Balance                                    Cognition Arousal/Alertness: Awake/alert Behavior During Therapy: WFL for tasks assessed/performed Overall Cognitive Status: Within Functional Limits for tasks assessed                      Exercises      General Comments        Pertinent Vitals/Pain Pain Assessment: 0-10 Pain Score: 6  Pain Location: R hip Pain Descriptors / Indicators: Aching;Sore Pain Intervention(s): Limited activity within patient's  tolerance;Monitored during session;Premedicated before session;Ice applied    Home Living Family/patient expects to be discharged to:: Private residence Living Arrangements: Spouse/significant other Available Help at Discharge: Family Type of Home: House Home Access: Stairs to enter Entrance Stairs-Rails: Right;Left Home Layout: One level Home Equipment: Cane - single point;Toilet riser;Bedside commode;Shower seat - built in Additional Comments: Pt is at home with spouse who is disabled and not able to offer physical assist.  Pt's dtr states, "mom actually is looking after him right now"    Prior Function Level of Independence: Independent          PT Goals (current goals can now be found in the care plan section) Acute Rehab PT Goals Patient Stated Goal: decrease pain PT Goal Formulation: With patient Time For Goal Achievement: 08/29/15 Potential to Achieve Goals: Good Progress towards PT goals: Progressing toward goals    Frequency  7X/week    PT Plan Current plan remains appropriate    Co-evaluation             End of Session Equipment Utilized During Treatment: Gait belt Activity Tolerance: Patient tolerated treatment well Patient left: in bed;with call bell/phone within reach     Time: 1340-1403 PT Time Calculation (min) (ACUTE ONLY): 23 min  Charges:  $Gait Training: 23-37 mins  G Codes:      Diana Bush 2015/09/12, 3:35 PM

## 2015-08-23 NOTE — Progress Notes (Signed)
Occupational Therapy Treatment Patient Details Name: Diana Bush MRN: [id number] DOB: Mar 22, 1941 Today's Date: 08/23/2015    History of present illness R THR    OT comments  Patient progressing towards OT goals; was able to work on performance of ADLs this session. Has increased pain and RN giving pain medications at end of session. OT will continue to follow.  Follow Up Recommendations  SNF;Supervision/Assistance - 24 hour ; Home Health OT   Equipment Recommendations  None recommended by OT    Recommendations for Other Services PT consult    Precautions / Restrictions Precautions Precautions: Fall Restrictions Weight Bearing Restrictions: No Other Position/Activity Restrictions: WBAT       Mobility Bed Mobility              Transfers Overall transfer level: Needs assistance Equipment used: Rolling walker (2 wheeled) Transfers: Sit to/from Stand Sit to Stand: Min guard             Balance                                   ADL Overall ADL's : Needs assistance/impaired Eating/Feeding: Independent;Sitting   Grooming: Wash/dry hands;Min guard;Standing   Upper Body Bathing: Minimal assitance;Sitting   Lower Body Bathing: Minimal assistance;Sit to/from stand   Upper Body Dressing : Minimal assistance;Sitting   Lower Body Dressing: Minimal assistance;Sit to/from stand   Toilet Transfer: Min guard;Ambulation;Regular Toilet;Grab bars;RW   Toileting- Architect and Hygiene: Min guard;Sit to/from stand       Functional mobility during ADLs: Min guard;Rolling walker General ADL Comments: Patient wanted to perform sponge bathing. Performed sponge bathing sit to stand from EOB with min A overall. Patient min A to don gown. Ambulated to and from bathroom with RW and min guard A. Min guard A with all aspects toileting as well as standing at sink to wash hands. Patient left up in recliner with nurse at bedside to give pain  medication.      Vision                     Perception     Praxis      Cognition   Behavior During Therapy: WFL for tasks assessed/performed Overall Cognitive Status: Within Functional Limits for tasks assessed                       Extremity/Trunk Assessment  Upper Extremity Assessment Upper Extremity Assessment: Overall WFL for tasks assessed   Lower Extremity Assessment Lower Extremity Assessment: Defer to PT evaluation RLE Deficits / Details: 2+/5 strength at hip with AAROM at hip to 80 flex and 15 abd   Cervical / Trunk Assessment Cervical / Trunk Assessment: Normal    Exercises    Shoulder Instructions       General Comments      Pertinent Vitals/ Pain       Pain Assessment: 0-10 Pain Score: 7  Pain Location: R hip Pain Descriptors / Indicators: Aching;Sore Pain Intervention(s): Limited activity within patient's tolerance;Monitored during session;Patient requesting pain meds-RN notified  Home Living Family/patient expects to be discharged to:: Private residence Living Arrangements: Spouse/significant other Available Help at Discharge: Family Type of Home: House Home Access: Stairs to enter Secretary/administrator of Steps: 5 Entrance Stairs-Rails: Right;Left Home Layout: One level     Bathroom Shower/Tub: Producer, television/film/video: Handicapped height Bathroom Accessibility: Yes How  Accessible: Accessible via walker Home Equipment: Cane - single point;Toilet riser;Bedside commode;Shower seat - built in   Additional Comments: Pt is at home with spouse who is disabled and not able to offer physical assist.  Pt's dtr states, "mom actually is looking after him right now"      Prior Functioning/Environment Level of Independence: Independent            Frequency Min 2X/week     Progress Toward Goals  OT Goals(current goals can now be found in the care plan section)  Progress towards OT goals: Progressing toward  goals  Acute Rehab OT Goals Patient Stated Goal: decrease pain OT Goal Formulation: With patient Time For Goal Achievement: 09/06/15 Potential to Achieve Goals: Good ADL Goals Pt Will Perform Lower Body Bathing: with modified independence;sit to/from stand Pt Will Perform Lower Body Dressing: with modified independence;sit to/from stand Pt Will Transfer to Toilet: with modified independence;ambulating;bedside commode Pt Will Perform Toileting - Clothing Manipulation and hygiene: with modified independence;sit to/from stand Pt Will Perform Tub/Shower Transfer: with modified independence;Shower transfer;rolling walker;shower seat  Plan Discharge plan remains appropriate    Co-evaluation                 End of Session Equipment Utilized During Treatment: Rolling walker   Activity Tolerance Patient tolerated treatment well   Patient Left in chair;with call bell/phone within reach;with nursing/sitter in room   Nurse Communication Patient requests pain meds        Time: 1248-1315 OT Time Calculation (min): 27 min  Charges: OT General Charges $OT Visit: 1 Procedure OT Treatments $Self Care/Home Management : 23-37 mins  Diana Bush A 08/23/2015, 2:01 PM

## 2015-08-23 NOTE — Progress Notes (Signed)
Subjective: 1 Day Post-Op Procedure(s) (LRB): RIGHT TOTAL HIP ARTHROPLASTY ANTERIOR APPROACH (Right) Patient reports pain as moderate.  Awake and alert.  Acute blood loss anemia from surgery, but asymptomatic thus far.  Objective: Vital signs in last 24 hours: Temp:  [97.5 F (36.4 C)-99.6 F (37.6 C)] 99.6 F (37.6 C) (10/15 0622) Pulse Rate:  [56-78] 69 (10/15 0622) Resp:  [8-18] 16 (10/15 0622) BP: (122-167)/(61-99) 122/61 mmHg (10/15 0622) SpO2:  [92 %-100 %] 99 % (10/15 0622) Weight:  [80.06 kg (176 lb 8 oz)] 80.06 kg (176 lb 8 oz) (10/14 1043)  Intake/Output from previous day: 10/14 0701 - 10/15 0700 In: 2580 [P.O.:420; I.V.:2000; IV Piggyback:160] Out: 1325 [Urine:1025; Blood:300] Intake/Output this shift: Total I/O In: 1213.8 [I.V.:1213.8] Out: 300 [Urine:300]   Recent Labs  08/23/15 0455  HGB 9.5*    Recent Labs  08/23/15 0455  WBC 8.4  RBC 3.47*  HCT 30.1*  PLT 173    Recent Labs  08/23/15 0455  NA 137  K 4.1  CL 104  CO2 28  BUN 19  CREATININE 0.79  GLUCOSE 130*  CALCIUM 8.8*   No results for input(s): LABPT, INR in the last 72 hours.  Sensation intact distally Intact pulses distally Dorsiflexion/Plantar flexion intact Incision: dressing C/D/I  Assessment/Plan: 1 Day Post-Op Procedure(s) (LRB): RIGHT TOTAL HIP ARTHROPLASTY ANTERIOR APPROACH (Right) Up with therapy  Diana Bush Y 08/23/2015, 7:34 AM

## 2015-08-23 NOTE — Op Note (Signed)
NAMEMarland Kitchen  Diana Bush, Diana Bush NO.:  [id number]  MEDICAL RECORD NO.:  [id number]  LOCATION:  1621                         FACILITY:  [facility]  PHYSICIAN:  Vanita Panda. 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  PROCEDURE:  Right total hip arthroplasty through direct anterior approach.  IMPLANTS:  DePuy Sector Gription acetabular component size 50, size 32+ 0 polyethylene liner, size 10 Corail femoral component with varus offset, size 36+ 1 ceramic hip ball.  SURGEON:  Vanita Panda. Magnus Ivan, M.D.  ASSISTANT:  Richardean Canal, PA-C.  ANESTHESIA:  General.  ANTIBIOTICS:  2 g of IV Ancef.  BLOOD LOSS:  300 mL.  COMPLICATIONS:  None.  INDICATIONS:  Diana Bush is a 74 year old female well known to me.  She has debilitating arthritis involving her right hip with x-ray showing complete loss of her right hip joint space, periarticular osteophytes, cystic changes and sclerotic changes.  Her pain is daily.  It is greatly and detrimentally effected her activities of daily living, mobility and her quality of life.  At this point, with failure of all forms of conservative treatment, she wished to proceed with a total hip arthroplasty through direct anterior approach.  She understands the risk of acute blood loss anemia, nerve and vessel injury, fracture, infection, dislocation, DVT.  She understands her goals are decreased pain, improved mobility, and overall improved quality of life.  PROCEDURE DESCRIPTION:  After informed consent was obtained, appropriate right hip was marked.  She was brought to the operating room and general anesthesia was obtained while she was on her stretcher.  A  Foley catheter was placed and then both feet had traction boots applied to all them.  Next, she was placed supine on the Hana fracture table with a perineal post in place and both legs in inline skeletal traction devices, but no traction applied.  Her right operative hip was then prepped and draped with DuraPrep and sterile drapes.  A time-out was called and she was identified as correct patient and correct right hip. We then made an incision inferior and posterior to the anterior superior iliac spine and carried this obliquely down the leg.  We dissected down the tensor fascia lata muscle and tensor fascia was then divided longitudinally so we could proceed with a direct anterior approach to the hip.  We identified and cauterized the lateral femoral circumflex vessels and then identified the hip capsule and placed Cobra retractors around the medial and lateral femoral neck and then opened up the hip capsule in L-type format finding a large joint effusion.  We placed our Cobra retractors within the hip capsule and then made our femoral neck cut with an oscillating saw proximal to the lesser trochanter and completed this with an osteotome.  We placed a corkscrew guide in the femoral head and removed the femoral head in it's entirety and found it to be devoid of cartilage.  We then cleaned  the acetabulum and remnants of the acetabular labrum and placed a bent Hohmann over the medial acetabular rim.  We then began reaming in 2-mm increments from size a 42 up to a size 50 with all reamers under direct visualization and last reamer under direct fluoroscopy, so I could obtain my depth of reaming, my inclination and anteversion.  Once we were done with that, we placed the real acetabular component from [employer]DePuy Sector Gription size 50.  We placed this under direct fluoroscopy again, so we could obtain our depth and inclination and anteversion.  We then placed the real 32+ 0 neutral polyethylene  liner.  Attention was then turned to the femur.  With the leg externally rotated to 100 degrees, extended and abducted, we were able to use a box cutting osteotome to enter the femoral canal and a rongeur to lateralize; however, we were able to place a Mueller retractor medially and a Hohmann retractor behind the greater trochanter.  We released the lateral joint capsule.  We then began broaching from a size 8 broach using the Corail broaching system up to a size 10.  With the size 10, we placed a varus offset femoral neck based on her anatomy and a 32+ 1 hip ball.  We brought the leg back over and up with traction and internal rotation, reducing the pelvis.  We were pleased with range of motion, leg length and offset.  We then dislocated the hip and removed the trial components.  We then placed the real DePuy Sector Gription acetabular component size 10 with varus offset and the real 32+ 1 ceramic hip ball.  We reduced this in the acetabulum.  We were pleased with stability.  We then copiously irrigated the soft tissues with normal saline solution using pulsatile lavage.  We then were able to close the joint capsule with interrupted #1 Ethibond suture followed by running #1 Vicryl in the tensor fascia, 0 Vicryl in the deep tissue, 2-0 Vicryl in the subcutaneous tissue, staples on the skin, Xeroform and well-padded sterile dressing.  She was then taken off the Hana table, awakened, extubated and taken to the recovery room in stable condition.  All final counts were correct.  There were no complications noted.  Of note, Richardean CanalGilbert Clark, PA-C assisted in entire case and his assistance was crucial for facilitating all aspects of this case and he was present the entire time.     Vanita Pandahristopher Y. Magnus IvanBlackman, M.D.     CYB/MEDQ  D:  08/22/2015  T:  08/23/2015  Job:  [id number]

## 2015-08-24 LAB — CBC
HCT: 29.7 % — ABNORMAL LOW (ref 36.0–46.0)
Hemoglobin: 9.5 g/dL — ABNORMAL LOW (ref 12.0–15.0)
MCH: 27.6 pg (ref 26.0–34.0)
MCHC: 32 g/dL (ref 30.0–36.0)
MCV: 86.3 fL (ref 78.0–100.0)
PLATELETS: 161 10*3/uL (ref 150–400)
RBC: 3.44 MIL/uL — ABNORMAL LOW (ref 3.87–5.11)
RDW: 13.9 % (ref 11.5–15.5)
WBC: 6.9 10*3/uL (ref 4.0–10.5)

## 2015-08-24 MED ORDER — LORATADINE 10 MG PO TABS
10.0000 mg | ORAL_TABLET | Freq: Every day | ORAL | Status: DC
  Administered 2015-08-24 – 2015-08-25 (×2): 10 mg via ORAL
  Filled 2015-08-24 (×2): qty 1

## 2015-08-24 MED ORDER — GUAIFENESIN 100 MG/5ML PO SOLN
30.0000 mL | ORAL | Status: DC | PRN
  Administered 2015-08-24: 600 mg via ORAL
  Filled 2015-08-24: qty 30

## 2015-08-24 NOTE — Progress Notes (Signed)
Physical Therapy Treatment Patient Details Name: Diana Bush MRN: [id number] DOB: Dec 24, 1940 Today's Date: 08/24/2015    History of Present Illness R THR     PT Comments    Pt progressing slowly with mobility and with questionable safety awareness.  Follow Up Recommendations        Equipment Recommendations       Recommendations for Other Services       Precautions / Restrictions Precautions Precautions: Fall Restrictions Weight Bearing Restrictions: No Other Position/Activity Restrictions: WBAT    Mobility  Bed Mobility Overal bed mobility: Needs Assistance Bed Mobility: Supine to Sit     Supine to sit: Min assist;Mod assist     General bed mobility comments: cues for sequence and use of L LE to self assist; physical assist to manage R LE and to bring trunk to upright  Transfers Overall transfer level: Needs assistance Equipment used: Rolling walker (2 wheeled) Transfers: Sit to/from Stand Sit to Stand: Min guard;From elevated surface         General transfer comment: cues for LE management and use of UEs to self assist  Ambulation/Gait Ambulation/Gait assistance: Min assist;Min guard Ambulation Distance (Feet): 88 Feet Assistive device: Rolling walker (2 wheeled) Gait Pattern/deviations: Step-to pattern;Step-through pattern;Decreased step length - right;Decreased step length - left;Shuffle;Trunk flexed Gait velocity: decr   General Gait Details: cues for posture, position from RW and initial sequence   Stairs            Wheelchair Mobility    Modified Rankin (Stroke Patients Only)       Balance                                    Cognition Arousal/Alertness: Awake/alert Behavior During Therapy: WFL for tasks assessed/performed Overall Cognitive Status: Within Functional Limits for tasks assessed                      Exercises Total Joint Exercises Ankle Circles/Pumps: AROM;Both;15 reps;Supine Quad Sets:  AROM;Both;10 reps;Supine Heel Slides: AAROM;Right;15 reps;Supine Hip ABduction/ADduction: AAROM;Right;10 reps;Supine    General Comments        Pertinent Vitals/Pain Pain Assessment: 0-10 Pain Score: 5  Pain Location: R hip Pain Descriptors / Indicators: Aching;Sore Pain Intervention(s): Limited activity within patient's tolerance;Monitored during session;Premedicated before session;Ice applied    Home Living                      Prior Function            PT Goals (current goals can now be found in the care plan section) Acute Rehab PT Goals Patient Stated Goal: decrease pain    Frequency       PT Plan      Co-evaluation             End of Session           Time: [id number] PT Time Calculation (min) (ACUTE ONLY): 31 min  Charges:                       G Codes:      Diana Bush 08/24/2015, 12:38 PM

## 2015-08-24 NOTE — Progress Notes (Signed)
Pt c/o chest congestion & cough, productive cough with moderate amt thick tenacious sputum, green-tinged yellow in color. Page placed to Dr Magnus IvanBlackman. Ysabelle Goodroe, Bed Bath [employer] Beyondaylor

## 2015-08-24 NOTE — Discharge Instructions (Signed)

## 2015-08-24 NOTE — Progress Notes (Signed)
Subjective: 2 Days Post-Op Procedure(s) (LRB): RIGHT TOTAL HIP ARTHROPLASTY ANTERIOR APPROACH (Right) Patient reports pain as moderate.  H/H stable.  Does not feel well and making slow progress with therapy.  Objective: Vital signs in last 24 hours: Temp:  [98.1 F (36.7 C)-100 F (37.8 C)] 99.1 F (37.3 C) (10/16 0626) Pulse Rate:  [68-95] 73 (10/16 0626) Resp:  [16-18] 16 (10/16 0626) BP: (138-179)/(62-99) 138/63 mmHg (10/16 0626) SpO2:  [98 %-99 %] 98 % (10/16 0626)  Intake/Output from previous day: 10/15 0701 - 10/16 0700 In: 1762.5 [P.O.:60; I.V.:1702.5] Out: 750 [Urine:750] Intake/Output this shift:     Recent Labs  08/23/15 0455 08/24/15 0612  HGB 9.5* 9.5*    Recent Labs  08/23/15 0455 08/24/15 0612  WBC 8.4 6.9  RBC 3.47* 3.44*  HCT 30.1* 29.7*  PLT 173 161    Recent Labs  08/23/15 0455  NA 137  K 4.1  CL 104  CO2 28  BUN 19  CREATININE 0.79  GLUCOSE 130*  CALCIUM 8.8*   No results for input(s): LABPT, INR in the last 72 hours.  Sensation intact distally Intact pulses distally Dorsiflexion/Plantar flexion intact Incision: scant drainage  Assessment/Plan: 2 Days Post-Op Procedure(s) (LRB): RIGHT TOTAL HIP ARTHROPLASTY ANTERIOR APPROACH (Right) Up with therapy Plan for discharge tomorrow - home vs SNF  Diana Bush Y 08/24/2015, 8:53 AM

## 2015-08-24 NOTE — Progress Notes (Signed)
Physical Therapy Treatment Patient Details Name: Diana Bush MRN: [id number] DOB: May 18, 1941 Today's Date: 08/24/2015    History of Present Illness R THR     PT Comments    Pt progressing steadily with mobility but continues to struggle with in/out bed and would benefit from follow up rehab at SNF level to maximize IND and safety prior to return home with disabled spouse and ltd assistance.  Follow Up Recommendations  SNF     Equipment Recommendations  Rolling walker with 5" wheels    Recommendations for Other Services OT consult     Precautions / Restrictions Precautions Precautions: Fall Restrictions Weight Bearing Restrictions: No Other Position/Activity Restrictions: WBAT    Mobility  Bed Mobility Overal bed mobility: Needs Assistance Bed Mobility: Supine to Sit;Sit to Supine     Supine to sit: Min assist;Mod assist Sit to supine: Min assist   General bed mobility comments: cues for sequence and use of L LE to self assist; physical assist to manage R LE and to bring trunk to upright  Transfers Overall transfer level: Needs assistance Equipment used: Rolling walker (2 wheeled) Transfers: Sit to/from Stand Sit to Stand: From elevated surface;Min assist         General transfer comment: cues for LE management and use of UEs to self assist; Physical assist to bring wt up and fwd and to balance on LEs initially  Ambulation/Gait Ambulation/Gait assistance: Min assist Ambulation Distance (Feet): 74 Feet (twice and 15' from bathroom) Assistive device: Rolling walker (2 wheeled) Gait Pattern/deviations: Step-to pattern;Step-through pattern;Decreased step length - right;Decreased step length - left;Shuffle;Trunk flexed Gait velocity: decr   General Gait Details: cues for posture, position from RW and initial sequence   Stairs            Wheelchair Mobility    Modified Rankin (Stroke Patients Only)       Balance                                     Cognition Arousal/Alertness: Awake/alert Behavior During Therapy: WFL for tasks assessed/performed Overall Cognitive Status: Within Functional Limits for tasks assessed                      Exercises      General Comments        Pertinent Vitals/Pain Pain Assessment: 0-10 Pain Score: 5  Pain Location: R hip Pain Descriptors / Indicators: Aching;Sore Pain Intervention(s): Limited activity within patient's tolerance;Monitored during session;Premedicated before session;Ice applied    Home Living                      Prior Function            PT Goals (current goals can now be found in the care plan section) Acute Rehab PT Goals Patient Stated Goal: decrease pain PT Goal Formulation: With patient Time For Goal Achievement: 08/29/15 Potential to Achieve Goals: Good Progress towards PT goals: Progressing toward goals    Frequency  7X/week    PT Plan Discharge plan needs to be updated    Co-evaluation             End of Session Equipment Utilized During Treatment: Gait belt Activity Tolerance: Patient tolerated treatment well Patient left: in bed;with call bell/phone within reach;with family/visitor present     Time: 1420-1450 PT Time Calculation (min) (ACUTE ONLY): 30 min  Charges:  $Gait Training: 23-37 mins                    G Codes:      Diana Bush 08/24/2015, 4:32 PM

## 2015-08-25 ENCOUNTER — Encounter (HOSPITAL_COMMUNITY): Payer: Self-pay | Admitting: Orthopaedic Surgery

## 2015-08-25 DIAGNOSIS — E785 Hyperlipidemia, unspecified: Secondary | ICD-10-CM | POA: Diagnosis not present

## 2015-08-25 DIAGNOSIS — M6281 Muscle weakness (generalized): Secondary | ICD-10-CM | POA: Diagnosis not present

## 2015-08-25 DIAGNOSIS — G709 Myoneural disorder, unspecified: Secondary | ICD-10-CM | POA: Diagnosis not present

## 2015-08-25 DIAGNOSIS — M25551 Pain in right hip: Secondary | ICD-10-CM | POA: Diagnosis not present

## 2015-08-25 DIAGNOSIS — Z471 Aftercare following joint replacement surgery: Secondary | ICD-10-CM | POA: Diagnosis not present

## 2015-08-25 DIAGNOSIS — G4700 Insomnia, unspecified: Secondary | ICD-10-CM | POA: Diagnosis not present

## 2015-08-25 DIAGNOSIS — R2681 Unsteadiness on feet: Secondary | ICD-10-CM | POA: Diagnosis not present

## 2015-08-25 DIAGNOSIS — M1611 Unilateral primary osteoarthritis, right hip: Secondary | ICD-10-CM | POA: Diagnosis not present

## 2015-08-25 DIAGNOSIS — K219 Gastro-esophageal reflux disease without esophagitis: Secondary | ICD-10-CM | POA: Diagnosis not present

## 2015-08-25 DIAGNOSIS — I10 Essential (primary) hypertension: Secondary | ICD-10-CM | POA: Diagnosis not present

## 2015-08-25 DIAGNOSIS — D649 Anemia, unspecified: Secondary | ICD-10-CM | POA: Diagnosis not present

## 2015-08-25 DIAGNOSIS — E784 Other hyperlipidemia: Secondary | ICD-10-CM | POA: Diagnosis not present

## 2015-08-25 DIAGNOSIS — Z4789 Encounter for other orthopedic aftercare: Secondary | ICD-10-CM | POA: Diagnosis not present

## 2015-08-25 DIAGNOSIS — M169 Osteoarthritis of hip, unspecified: Secondary | ICD-10-CM | POA: Diagnosis not present

## 2015-08-25 DIAGNOSIS — Z96641 Presence of right artificial hip joint: Secondary | ICD-10-CM | POA: Diagnosis not present

## 2015-08-25 MED ORDER — METHOCARBAMOL 500 MG PO TABS: 500.0000 mg | ORAL_TABLET | Freq: Four times a day (QID) | ORAL | Status: DC | PRN

## 2015-08-25 MED ORDER — OXYCODONE-ACETAMINOPHEN 5-325 MG PO TABS: 1.0000 | ORAL_TABLET | ORAL | Status: DC | PRN

## 2015-08-25 MED ORDER — ASPIRIN 325 MG PO TBEC: 325.0000 mg | DELAYED_RELEASE_TABLET | Freq: Two times a day (BID) | ORAL | Status: DC

## 2015-08-25 NOTE — Progress Notes (Signed)
[facility]  CSW facilitated pt discharge needs including contacting facility, faxing pt discharge information via [facility], discussing with pt at bedside, providing RN phone number to call report, and arranging ambulance transport for pt to [facility].   Pt coping appropriately with transition to SNF.   No further social work needs identified at this time.  CSW signing off.   Loletta SpecterSuzanna Erice Ahles, MSW, LCSW Clinical Social Work [phone]

## 2015-08-25 NOTE — Clinical Social Work Note (Signed)
Clinical Social Work Assessment  Patient Details  Name: Diana Bush MRN: [id number] Date of Birth: August 22, 1941  Date of referral:  08/25/15               Reason for consult:  Discharge Planning                Permission sought to share information with:  Family Supports Permission granted to share information::  Yes, Verbal Permission Granted  Name::     Radiah Lubinski  Agency::     Relationship::  daughter  Contact Information:  [phone]  Housing/Transportation Living arrangements for the past 2 months:  Single Family Home Source of Information:  Patient Patient Interpreter Needed:  None Criminal Activity/Legal Involvement Pertinent to Current Situation/Hospitalization:  No - Comment as needed Significant Relationships:  Adult Children, Spouse Lives with:  Spouse Do you feel safe going back to the place where you live?  No Need for family participation in patient care:  No (Coment)  Care giving concerns:  Pt admitted from home with spouse with hip fracture. PT recommending SNF.    Social Worker assessment / plan:  CSW received referral for new SNF.  CSW met with pt at bedside. CSW introduced self and explained role. Pt discussed that she lives at home with pt spouse. Pt aware of need for rehab at SNF. CSW discussed process of SNF search and pt agreeable to [facility] search.  CSW completed FL2 and initiated SNF search to [facility].   CSW followed up with pt at bedside to provide SNF bed offers. Pt chooses bed at [facility]. CSW contacted [facility] and confirmed bed availability today. [facility] request transport be arranged for 2:30 pm.   CSW to facilitate pt discharge needs this afternoon.   Employment status:  Retired Forensic scientist:  Medicare PT Recommendations:  [place] / Referral to community resources:  [place]  Patient/Family's Response to care:  Pt alert  and oriented x 4. Pt discussed that she is not very familiar with SNF facilities as pt just moved two years ago to [place] [facility] from [place]. Pt grateful to have something close to her home for her short term rehab needs.  Patient/Family's Understanding of and Emotional Response to Diagnosis, Current Treatment, and Prognosis:  Pt aware of discharge today and agreeable to [facility] for short term rehab.  Emotional Assessment Appearance:  Appears stated age Attitude/Demeanor/Rapport:  Other (pt appropriate) Affect (typically observed):  Accepting, Adaptable Orientation:  Oriented to Self, Oriented to Place, Oriented to  Time, Oriented to Situation Alcohol / Substance use:  Not Applicable Psych involvement (Current and /or in the community):  No (Comment)  Discharge Needs  Concerns to be addressed:  Discharge Planning Concerns Readmission within the last 30 days:  No Current discharge risk:  None Barriers to Discharge:  No Barriers Identified   [place], [place], LCSW 08/25/2015, 1:03 PM  [phone]

## 2015-08-25 NOTE — Care Management Important Message (Signed)
Important Message  Patient Details  Name: Diana Bush MRN: [id number] Date of Birth: 08-Sep-1941   Medicare Important Message Given:  Yes-second notification given    Haskell FlirtJamison, Debralee Braaksma 08/25/2015, 1:06 PMImportant Message  Patient Details  Name: Diana Bush MRN: [id number] Date of Birth: 08-Sep-1941   Medicare Important Message Given:  Yes-second notification given    Haskell FlirtJamison, Nikitha Mode 08/25/2015, 1:06 PM

## 2015-08-25 NOTE — Clinical Social Work Placement (Signed)
   CLINICAL SOCIAL WORK PLACEMENT  NOTE  Date:  08/25/2015  Patient Details  Name: Diana Bush MRN: [id number] Date of Birth: Mar 03, 1941  Clinical Social Work is seeking post-discharge placement for this patient at the Skilled  Nursing Facility level of care (*CSW will initial, date and re-position this form in  chart as items are completed):  Yes   Patient/family provided with [facility] Clinical Social Work Department's list of facilities offering this level of care within the geographic area requested by the patient (or if unable, by the patient's family).  Yes   Patient/family informed of their freedom to choose among providers that offer the needed level of care, that participate in Medicare, Medicaid or managed care program needed by the patient, have an available bed and are willing to accept the patient.  Yes   Patient/family informed of [name] ownership interest in [facility] and [facility], as well as of the fact that they are under no obligation to receive care at these facilities.  PASRR submitted to EDS on 08/25/15     PASRR number received on 08/25/15     Existing PASRR number confirmed on       FL2 transmitted to all facilities in geographic area requested by pt/family on 08/25/15     FL2 transmitted to all facilities within larger geographic area on       Patient informed that his/her managed care company has contracts with or will negotiate with certain facilities, including the following:        Yes   Patient/family informed of bed offers received.  [facility]     Physician recommends and patient chooses bed at      [facility]  Patient to be transferred to facility by ambulance Sharin Mons(PTAR)     Patient family notified on 08/25/15 of transfer.  Name of family member notified:  pt notified at bedside     PHYSICIAN Please sign FL2     Additional  Comment:    _______________________________________________ Orson EvaKIDD, Claris Pech A, LCSW 08/25/2015, 1:00 PM

## 2015-08-25 NOTE — Progress Notes (Signed)
No acute changes.  PT recommending SNF placement short-term.  Vitals stable.  Right hip dressing clean.  Can discharge to skilled nursing today.

## 2015-08-25 NOTE — Discharge Summary (Signed)
Patient ID: Diana Bush MRN: [id number] DOB/AGE: 1941-01-06 74 y.o.  Admit date: 08/22/2015 Discharge date: 08/25/2015  Admission Diagnoses:  Principal Problem:   Osteoarthritis of right hip Active Problems:   Status post total replacement of right hip   Discharge Diagnoses:  Same  Past Medical History  Diagnosis Date  . Hypertension   . Borderline hyperlipidemia   . Neuromuscular disorder (HCC)   . GERD (gastroesophageal reflux disease)   . Difficulty sleeping     DUE TO PAIN    Surgeries: Procedure(s): RIGHT TOTAL HIP ARTHROPLASTY ANTERIOR APPROACH on 08/22/2015   Consultants:    Discharged Condition: Improved  Hospital Course: Diana Bush is an 74 y.o. female who was admitted 08/22/2015 for operative treatment ofOsteoarthritis of right hip. Patient has severe unremitting pain that affects sleep, daily activities, and work/hobbies. After pre-op clearance the patient was taken to the operating room on 08/22/2015 and underwent  Procedure(s): RIGHT TOTAL HIP ARTHROPLASTY ANTERIOR APPROACH.    Patient was given perioperative antibiotics: Anti-infectives    Start     Dose/Rate Route Frequency Ordered Stop   08/22/15 1830  ceFAZolin (ANCEF) IVPB 1 g/50 mL premix     1 g 100 mL/hr over 30 Minutes Intravenous Every 6 hours 08/22/15 1522 08/23/15 0017   08/22/15 1012  ceFAZolin (ANCEF) IVPB 2 g/50 mL premix     2 g 100 mL/hr over 30 Minutes Intravenous On call to O.R. 08/22/15 1012 08/22/15 1250       Patient was given sequential compression devices, early ambulation, and chemoprophylaxis to prevent DVT.  Patient benefited maximally from hospital stay and there were no complications.    Recent vital signs: Patient Vitals for the past 24 hrs:  BP Temp Temp src Pulse Resp SpO2  08/25/15 0913 135/71 mmHg - - 62 16 98 %  08/25/15 0458 120/62 mmHg 99.6 F (37.6 C) Oral 73 16 96 %  08/24/15 2157 140/69 mmHg 99.7 F (37.6 C) Oral 74 15 96 %  08/24/15 1400 133/66 mmHg 98.9  F (37.2 C) Oral 69 16 98 %     Recent laboratory studies:  Recent Labs  08/23/15 0455 08/24/15 0612  WBC 8.4 6.9  HGB 9.5* 9.5*  HCT 30.1* 29.7*  PLT 173 161  NA 137  --   K 4.1  --   CL 104  --   CO2 28  --   BUN 19  --   CREATININE 0.79  --   GLUCOSE 130*  --   CALCIUM 8.8*  --      Discharge Medications:     Medication List    STOP taking these medications        meloxicam 15 MG tablet  Commonly known as:  MOBIC      TAKE these medications        aspirin 325 MG EC tablet  Take 1 tablet (325 mg total) by mouth 2 (two) times daily after a meal.     cetirizine 10 MG tablet  Commonly known as:  ZYRTEC  Take 10 mg by mouth daily.     cholecalciferol 1000 UNITS tablet  Commonly known as:  VITAMIN D  Take 1,000 Units by mouth daily.     losartan-hydrochlorothiazide 100-25 MG tablet  Commonly known as:  HYZAAR  Take 1 tablet by mouth every morning.     meclizine 25 MG tablet  Commonly known as:  ANTIVERT  Take 25 mg by mouth every 8 (eight) hours as needed for dizziness or  nausea.     methocarbamol 500 MG tablet  Commonly known as:  ROBAXIN  Take 1 tablet (500 mg total) by mouth every 6 (six) hours as needed for muscle spasms.     naproxen sodium 220 MG tablet  Commonly known as:  ANAPROX  Take 220 mg by mouth 2 (two) times daily as needed (for pain).     OMEGA 3 PO  Take 1 capsule by mouth daily.     oxyCODONE-acetaminophen 5-325 MG tablet  Commonly known as:  ROXICET  Take 1-2 tablets by mouth every 4 (four) hours as needed.     vitamin E 400 UNIT capsule  Take 400 Units by mouth daily.        Diagnostic Studies: Dg C-arm 1-60 Min-no Report  08/22/2015  CLINICAL DATA: surgery C-ARM 1-60 MINUTES Fluoroscopy was utilized by the requesting physician.  No radiographic interpretation.   Dg Hip Unilat With Pelvis 1v Right  08/22/2015  CLINICAL DATA:  Status post right total hip replacement. EXAM: DG HIP (WITH OR WITHOUT PELVIS) 1V RIGHT; DG  C-ARM 1-60 MIN-NO REPORT COMPARISON:  None. Fluoroscopy time:  24 seconds. FINDINGS: Two intraoperative fluoroscopic images of the right hip were obtained. The femoral and acetabular components appear to be well situated. No fracture or dislocation is noted. IMPRESSION: Status post right total hip replacement. Electronically Signed   By: Lupita RaiderJames  Green Jr, M.D.   On: 08/22/2015 14:13   Dg Hip Port Unilat With Pelvis 1v Right  08/22/2015  CLINICAL DATA:  Status post right total hip arthroplasty. EXAM: DG HIP (WITH OR WITHOUT PELVIS) 1V PORT RIGHT COMPARISON:  Same day. FINDINGS: The femoral and acetabular components appear to be well situated. Surgical staples and other expected postsurgical findings are noted around the right hip. Left hip appears normal. IMPRESSION: Status post right total hip arthroplasty. Electronically Signed   By: Lupita RaiderJames  Green Jr, M.D.   On: 08/22/2015 14:50    Disposition: to skilled nursing facility       Discharge Instructions    Discharge patient    Complete by:  As directed      Full weight bearing    Complete by:  As directed            Follow-up Information    Follow up with Kathryne HitchBLACKMAN,Lorelee Mclaurin Y, MD In 2 weeks.   Specialty:  Orthopedic Surgery   Contact information:   [address] [place] [address] [phone]        Signed: Kathryne HitchBLACKMAN,Erskine Steinfeldt Y 08/25/2015, 9:22 AM

## 2015-08-25 NOTE — Progress Notes (Signed)
Physical Therapy Treatment Patient Details Name: Janann ColonelMary Carchi MRN: [id number] DOB: April 26, 1941 Today's Date: 08/25/2015    History of Present Illness R THR     PT Comments    POD # 3 am session.  Assisted OOB to amb a greater distance in hallway then performed some THR TE's followed by ICE.  Pt will need to D/C to SNF for ST Rehab.   Follow Up Recommendations  SNF     Equipment Recommendations       Recommendations for Other Services       Precautions / Restrictions Precautions Precautions: Fall Restrictions Weight Bearing Restrictions: No Other Position/Activity Restrictions: WBAT    Mobility  Bed Mobility Overal bed mobility: Needs Assistance Bed Mobility: Supine to Sit     Supine to sit: Min guard;Min assist     General bed mobility comments: assist with R LE and increased time   Transfers Overall transfer level: Needs assistance Equipment used: Rolling walker (2 wheeled) Transfers: Sit to/from Stand Sit to Stand: From elevated surface;Min guard         General transfer comment: increased time and one VC on safety with turns  Ambulation/Gait Ambulation/Gait assistance: Min guard Ambulation Distance (Feet): 75 Feet Assistive device: Rolling walker (2 wheeled) Gait Pattern/deviations: Step-to pattern;Decreased stance time - right Gait velocity: decr   General Gait Details: one VC on safety with turns plus increased time   Stairs            Wheelchair Mobility    Modified Rankin (Stroke Patients Only)       Balance                                    Cognition Arousal/Alertness: Awake/alert Behavior During Therapy: WFL for tasks assessed/performed Overall Cognitive Status: Within Functional Limits for tasks assessed                      Exercises   Total Hip Replacement TE's 10 reps ankle pumps 10 reps knee presses 10 reps heel slides 10 reps SAQ's 10 reps ABD Followed by ICE     General Comments        Pertinent Vitals/Pain Pain Assessment: 0-10 Pain Score: 5  Pain Location: R hip Pain Descriptors / Indicators: Aching;Sore;Tightness Pain Intervention(s): Monitored during session;Premedicated before session;Repositioned;Ice applied    Home Living                      Prior Function            PT Goals (current goals can now be found in the care plan section) Progress towards PT goals: Progressing toward goals    Frequency  7X/week    PT Plan Discharge plan needs to be updated    Co-evaluation             End of Session Equipment Utilized During Treatment: Gait belt Activity Tolerance: Patient tolerated treatment well Patient left: in chair;with call bell/phone within reach;with family/visitor present     Time: 1200-1225 PT Time Calculation (min) (ACUTE ONLY): 25 min  Charges:  $Gait Training: 8-22 mins $Therapeutic Exercise: 8-22 mins                    G Codes:      Felecia ShellingLori Lanique Gonzalo  PTA [facility]  Acute  Rehab Pager      [phone]

## 2015-08-26 DIAGNOSIS — Z96641 Presence of right artificial hip joint: Secondary | ICD-10-CM | POA: Diagnosis not present

## 2015-08-26 DIAGNOSIS — I10 Essential (primary) hypertension: Secondary | ICD-10-CM | POA: Diagnosis not present

## 2015-08-26 DIAGNOSIS — K219 Gastro-esophageal reflux disease without esophagitis: Secondary | ICD-10-CM | POA: Diagnosis not present

## 2015-08-27 DIAGNOSIS — I10 Essential (primary) hypertension: Secondary | ICD-10-CM | POA: Diagnosis not present

## 2015-08-27 DIAGNOSIS — Z96641 Presence of right artificial hip joint: Secondary | ICD-10-CM | POA: Diagnosis not present

## 2015-08-27 DIAGNOSIS — E785 Hyperlipidemia, unspecified: Secondary | ICD-10-CM | POA: Diagnosis not present

## 2015-08-29 DIAGNOSIS — D649 Anemia, unspecified: Secondary | ICD-10-CM | POA: Diagnosis not present

## 2015-08-29 DIAGNOSIS — K219 Gastro-esophageal reflux disease without esophagitis: Secondary | ICD-10-CM | POA: Diagnosis not present

## 2015-08-29 DIAGNOSIS — M169 Osteoarthritis of hip, unspecified: Secondary | ICD-10-CM | POA: Diagnosis not present

## 2015-08-29 DIAGNOSIS — I10 Essential (primary) hypertension: Secondary | ICD-10-CM | POA: Diagnosis not present

## 2015-08-29 DIAGNOSIS — Z471 Aftercare following joint replacement surgery: Secondary | ICD-10-CM | POA: Diagnosis not present

## 2015-09-01 DIAGNOSIS — I10 Essential (primary) hypertension: Secondary | ICD-10-CM | POA: Diagnosis not present

## 2015-09-01 DIAGNOSIS — Z96641 Presence of right artificial hip joint: Secondary | ICD-10-CM | POA: Diagnosis not present

## 2015-09-01 DIAGNOSIS — K219 Gastro-esophageal reflux disease without esophagitis: Secondary | ICD-10-CM | POA: Diagnosis not present

## 2015-09-04 DIAGNOSIS — M1611 Unilateral primary osteoarthritis, right hip: Secondary | ICD-10-CM | POA: Diagnosis not present

## 2015-09-12 DIAGNOSIS — Z96641 Presence of right artificial hip joint: Secondary | ICD-10-CM | POA: Diagnosis not present

## 2015-09-12 DIAGNOSIS — K219 Gastro-esophageal reflux disease without esophagitis: Secondary | ICD-10-CM | POA: Diagnosis not present

## 2015-09-12 DIAGNOSIS — E785 Hyperlipidemia, unspecified: Secondary | ICD-10-CM | POA: Diagnosis not present

## 2015-09-12 DIAGNOSIS — I10 Essential (primary) hypertension: Secondary | ICD-10-CM | POA: Diagnosis not present

## 2015-09-22 DIAGNOSIS — Z96641 Presence of right artificial hip joint: Secondary | ICD-10-CM | POA: Diagnosis not present

## 2015-09-22 DIAGNOSIS — Z471 Aftercare following joint replacement surgery: Secondary | ICD-10-CM | POA: Diagnosis not present

## 2015-09-23 DIAGNOSIS — Z471 Aftercare following joint replacement surgery: Secondary | ICD-10-CM | POA: Diagnosis not present

## 2015-09-23 DIAGNOSIS — Z96641 Presence of right artificial hip joint: Secondary | ICD-10-CM | POA: Diagnosis not present

## 2015-09-25 DIAGNOSIS — Z96641 Presence of right artificial hip joint: Secondary | ICD-10-CM | POA: Diagnosis not present

## 2015-09-25 DIAGNOSIS — Z471 Aftercare following joint replacement surgery: Secondary | ICD-10-CM | POA: Diagnosis not present

## 2015-09-29 DIAGNOSIS — Z96641 Presence of right artificial hip joint: Secondary | ICD-10-CM | POA: Diagnosis not present

## 2015-09-29 DIAGNOSIS — Z471 Aftercare following joint replacement surgery: Secondary | ICD-10-CM | POA: Diagnosis not present

## 2015-10-01 DIAGNOSIS — Z471 Aftercare following joint replacement surgery: Secondary | ICD-10-CM | POA: Diagnosis not present

## 2015-10-01 DIAGNOSIS — Z96641 Presence of right artificial hip joint: Secondary | ICD-10-CM | POA: Diagnosis not present

## 2015-10-06 DIAGNOSIS — Z96641 Presence of right artificial hip joint: Secondary | ICD-10-CM | POA: Diagnosis not present

## 2015-10-06 DIAGNOSIS — Z471 Aftercare following joint replacement surgery: Secondary | ICD-10-CM | POA: Diagnosis not present

## 2015-10-09 DIAGNOSIS — Z96641 Presence of right artificial hip joint: Secondary | ICD-10-CM | POA: Diagnosis not present

## 2015-10-09 DIAGNOSIS — Z471 Aftercare following joint replacement surgery: Secondary | ICD-10-CM | POA: Diagnosis not present

## 2015-11-14 DIAGNOSIS — Z719 Counseling, unspecified: Secondary | ICD-10-CM | POA: Diagnosis not present

## 2015-11-14 DIAGNOSIS — I10 Essential (primary) hypertension: Secondary | ICD-10-CM | POA: Diagnosis not present

## 2015-11-14 DIAGNOSIS — E784 Other hyperlipidemia: Secondary | ICD-10-CM | POA: Diagnosis not present

## 2015-11-14 DIAGNOSIS — M1611 Unilateral primary osteoarthritis, right hip: Secondary | ICD-10-CM | POA: Diagnosis not present

## 2015-12-02 DIAGNOSIS — M1611 Unilateral primary osteoarthritis, right hip: Secondary | ICD-10-CM | POA: Diagnosis not present

## 2015-12-02 DIAGNOSIS — M25511 Pain in right shoulder: Secondary | ICD-10-CM | POA: Diagnosis not present

## 2016-01-22 ENCOUNTER — Other Ambulatory Visit: Payer: Self-pay

## 2016-01-22 DIAGNOSIS — Z1231 Encounter for screening mammogram for malignant neoplasm of breast: Secondary | ICD-10-CM

## 2016-01-28 ENCOUNTER — Ambulatory Visit
Admission: RE | Admit: 2016-01-28 | Discharge: 2016-01-28 | Disposition: A | Discharge status: Home / Self Care | Payer: Medicare Other | Source: Ambulatory Visit

## 2016-01-28 DIAGNOSIS — Z1231 Encounter for screening mammogram for malignant neoplasm of breast: Secondary | ICD-10-CM | POA: Diagnosis not present

## 2016-02-11 DIAGNOSIS — I10 Essential (primary) hypertension: Secondary | ICD-10-CM | POA: Diagnosis not present

## 2016-02-11 DIAGNOSIS — E784 Other hyperlipidemia: Secondary | ICD-10-CM | POA: Diagnosis not present

## 2016-02-11 DIAGNOSIS — M1611 Unilateral primary osteoarthritis, right hip: Secondary | ICD-10-CM | POA: Diagnosis not present

## 2016-02-11 DIAGNOSIS — Z131 Encounter for screening for diabetes mellitus: Secondary | ICD-10-CM | POA: Diagnosis not present

## 2016-06-26 ENCOUNTER — Encounter (INDEPENDENT_AMBULATORY_CARE_PROVIDER_SITE_OTHER): Payer: Self-pay

## 2016-07-09 DIAGNOSIS — I639 Cerebral infarction, unspecified: Secondary | ICD-10-CM

## 2016-07-09 HISTORY — DX: Cerebral infarction, unspecified: I63.9

## 2016-07-28 ENCOUNTER — Emergency Department (HOSPITAL_COMMUNITY): Payer: Medicare Other | Admitting: Anesthesiology

## 2016-07-28 ENCOUNTER — Encounter (HOSPITAL_COMMUNITY)
Admission: EM | Disposition: A | Discharge status: Skilled Nursing Facility | Payer: Self-pay | Source: Home / Self Care | Attending: Neurology

## 2016-07-28 ENCOUNTER — Inpatient Hospital Stay (HOSPITAL_COMMUNITY)
Admission: EM | Admit: 2016-07-28 | Discharge: 2016-08-04 | DRG: 023 | Disposition: A | Discharge status: Skilled Nursing Facility | Payer: Medicare Other | Attending: Neurology | Admitting: Neurology

## 2016-07-28 ENCOUNTER — Emergency Department (HOSPITAL_COMMUNITY): Payer: Medicare Other

## 2016-07-28 ENCOUNTER — Encounter (HOSPITAL_COMMUNITY): Payer: Self-pay | Admitting: Emergency Medicine

## 2016-07-28 DIAGNOSIS — R29721 NIHSS score 21: Secondary | ICD-10-CM | POA: Diagnosis present

## 2016-07-28 DIAGNOSIS — I69391 Dysphagia following cerebral infarction: Secondary | ICD-10-CM | POA: Diagnosis not present

## 2016-07-28 DIAGNOSIS — J9600 Acute respiratory failure, unspecified whether with hypoxia or hypercapnia: Secondary | ICD-10-CM | POA: Diagnosis not present

## 2016-07-28 DIAGNOSIS — R739 Hyperglycemia, unspecified: Secondary | ICD-10-CM | POA: Diagnosis present

## 2016-07-28 DIAGNOSIS — I160 Hypertensive urgency: Secondary | ICD-10-CM

## 2016-07-28 DIAGNOSIS — R471 Dysarthria and anarthria: Secondary | ICD-10-CM | POA: Diagnosis present

## 2016-07-28 DIAGNOSIS — F329 Major depressive disorder, single episode, unspecified: Secondary | ICD-10-CM | POA: Diagnosis not present

## 2016-07-28 DIAGNOSIS — R29810 Facial weakness: Secondary | ICD-10-CM | POA: Diagnosis present

## 2016-07-28 DIAGNOSIS — F4321 Adjustment disorder with depressed mood: Secondary | ICD-10-CM | POA: Diagnosis not present

## 2016-07-28 DIAGNOSIS — I69322 Dysarthria following cerebral infarction: Secondary | ICD-10-CM | POA: Diagnosis not present

## 2016-07-28 DIAGNOSIS — I161 Hypertensive emergency: Secondary | ICD-10-CM | POA: Diagnosis present

## 2016-07-28 DIAGNOSIS — I10 Essential (primary) hypertension: Secondary | ICD-10-CM | POA: Diagnosis not present

## 2016-07-28 DIAGNOSIS — R4182 Altered mental status, unspecified: Secondary | ICD-10-CM | POA: Diagnosis not present

## 2016-07-28 DIAGNOSIS — I480 Paroxysmal atrial fibrillation: Secondary | ICD-10-CM | POA: Diagnosis not present

## 2016-07-28 DIAGNOSIS — M1990 Unspecified osteoarthritis, unspecified site: Secondary | ICD-10-CM | POA: Diagnosis present

## 2016-07-28 DIAGNOSIS — R1310 Dysphagia, unspecified: Secondary | ICD-10-CM | POA: Diagnosis present

## 2016-07-28 DIAGNOSIS — I63413 Cerebral infarction due to embolism of bilateral middle cerebral arteries: Principal | ICD-10-CM | POA: Diagnosis present

## 2016-07-28 DIAGNOSIS — I663 Occlusion and stenosis of cerebellar arteries: Secondary | ICD-10-CM | POA: Diagnosis not present

## 2016-07-28 DIAGNOSIS — I63411 Cerebral infarction due to embolism of right middle cerebral artery: Secondary | ICD-10-CM | POA: Diagnosis not present

## 2016-07-28 DIAGNOSIS — I69320 Aphasia following cerebral infarction: Secondary | ICD-10-CM | POA: Diagnosis not present

## 2016-07-28 DIAGNOSIS — E876 Hypokalemia: Secondary | ICD-10-CM | POA: Diagnosis present

## 2016-07-28 DIAGNOSIS — E785 Hyperlipidemia, unspecified: Secondary | ICD-10-CM | POA: Diagnosis present

## 2016-07-28 DIAGNOSIS — I6789 Other cerebrovascular disease: Secondary | ICD-10-CM | POA: Diagnosis not present

## 2016-07-28 DIAGNOSIS — M6281 Muscle weakness (generalized): Secondary | ICD-10-CM | POA: Diagnosis not present

## 2016-07-28 DIAGNOSIS — R4701 Aphasia: Secondary | ICD-10-CM | POA: Diagnosis present

## 2016-07-28 DIAGNOSIS — I69354 Hemiplegia and hemiparesis following cerebral infarction affecting left non-dominant side: Secondary | ICD-10-CM | POA: Diagnosis not present

## 2016-07-28 DIAGNOSIS — I63312 Cerebral infarction due to thrombosis of left middle cerebral artery: Secondary | ICD-10-CM | POA: Diagnosis not present

## 2016-07-28 DIAGNOSIS — H53462 Homonymous bilateral field defects, left side: Secondary | ICD-10-CM | POA: Diagnosis present

## 2016-07-28 DIAGNOSIS — I4891 Unspecified atrial fibrillation: Secondary | ICD-10-CM | POA: Diagnosis present

## 2016-07-28 DIAGNOSIS — G8194 Hemiplegia, unspecified affecting left nondominant side: Secondary | ICD-10-CM | POA: Diagnosis not present

## 2016-07-28 DIAGNOSIS — D62 Acute posthemorrhagic anemia: Secondary | ICD-10-CM | POA: Diagnosis not present

## 2016-07-28 DIAGNOSIS — I6601 Occlusion and stenosis of right middle cerebral artery: Secondary | ICD-10-CM | POA: Diagnosis not present

## 2016-07-28 DIAGNOSIS — Q251 Coarctation of aorta: Secondary | ICD-10-CM | POA: Diagnosis not present

## 2016-07-28 DIAGNOSIS — I639 Cerebral infarction, unspecified: Secondary | ICD-10-CM

## 2016-07-28 DIAGNOSIS — R1312 Dysphagia, oropharyngeal phase: Secondary | ICD-10-CM | POA: Diagnosis not present

## 2016-07-28 DIAGNOSIS — I63311 Cerebral infarction due to thrombosis of right middle cerebral artery: Secondary | ICD-10-CM | POA: Diagnosis present

## 2016-07-28 DIAGNOSIS — I4892 Unspecified atrial flutter: Secondary | ICD-10-CM | POA: Diagnosis not present

## 2016-07-28 DIAGNOSIS — F32A Depression, unspecified: Secondary | ICD-10-CM | POA: Diagnosis present

## 2016-07-28 DIAGNOSIS — R262 Difficulty in walking, not elsewhere classified: Secondary | ICD-10-CM

## 2016-07-28 HISTORY — DX: Paroxysmal atrial fibrillation: I48.0

## 2016-07-28 HISTORY — DX: Unspecified osteoarthritis, unspecified site: M19.90

## 2016-07-28 HISTORY — PX: IR GENERIC HISTORICAL: IMG1180011

## 2016-07-28 HISTORY — PX: RADIOLOGY WITH ANESTHESIA: SHX6223

## 2016-07-28 LAB — DIFFERENTIAL
BASOS ABS: 0 10*3/uL (ref 0.0–0.1)
BASOS PCT: 0 %
EOS ABS: 0.2 10*3/uL (ref 0.0–0.7)
Eosinophils Relative: 4 %
Lymphocytes Relative: 42 %
Lymphs Abs: 2.5 10*3/uL (ref 0.7–4.0)
Monocytes Absolute: 0.5 10*3/uL (ref 0.1–1.0)
Monocytes Relative: 8 %
NEUTROS PCT: 46 %
Neutro Abs: 2.7 10*3/uL (ref 1.7–7.7)

## 2016-07-28 LAB — COMPREHENSIVE METABOLIC PANEL
ALBUMIN: 3.8 g/dL (ref 3.5–5.0)
ALT: 42 U/L (ref 14–54)
ANION GAP: 6 (ref 5–15)
AST: 25 U/L (ref 15–41)
Alkaline Phosphatase: 68 U/L (ref 38–126)
BUN: 21 mg/dL — AB (ref 6–20)
CHLORIDE: 106 mmol/L (ref 101–111)
CO2: 26 mmol/L (ref 22–32)
Calcium: 9.2 mg/dL (ref 8.9–10.3)
Creatinine, Ser: 0.94 mg/dL (ref 0.44–1.00)
GFR calc Af Amer: >60 mL/min (ref >60)
GFR calc non Af Amer: 58 mL/min — ABNORMAL LOW (ref >60)
GLUCOSE: 103 mg/dL — AB (ref 65–99)
POTASSIUM: 3.7 mmol/L (ref 3.5–5.1)
SODIUM: 138 mmol/L (ref 135–145)
Total Bilirubin: 0.7 mg/dL (ref 0.3–1.2)
Total Protein: 7 g/dL (ref 6.5–8.1)

## 2016-07-28 LAB — RAPID URINE DRUG SCREEN, HOSP PERFORMED
Amphetamines: NOT DETECTED
BARBITURATES: NOT DETECTED
BENZODIAZEPINES: NOT DETECTED
COCAINE: NOT DETECTED
OPIATES: NOT DETECTED
Tetrahydrocannabinol: NOT DETECTED

## 2016-07-28 LAB — ETHANOL

## 2016-07-28 LAB — I-STAT CHEM 8, ED
BUN: 26 mg/dL — ABNORMAL HIGH (ref 6–20)
CREATININE: 1 mg/dL (ref 0.44–1.00)
Calcium, Ion: 0.97 mmol/L — ABNORMAL LOW (ref 1.15–1.40)
Chloride: 106 mmol/L (ref 101–111)
Glucose, Bld: 103 mg/dL — ABNORMAL HIGH (ref 65–99)
HEMATOCRIT: 35 % — AB (ref 36.0–46.0)
HEMOGLOBIN: 11.9 g/dL — AB (ref 12.0–15.0)
POTASSIUM: 3.7 mmol/L (ref 3.5–5.1)
Sodium: 139 mmol/L (ref 135–145)
TCO2: 24 mmol/L (ref 0–100)

## 2016-07-28 LAB — CBC
HCT: 36.3 % (ref 36.0–46.0)
HEMOGLOBIN: 11.1 g/dL — AB (ref 12.0–15.0)
MCH: 26.7 pg (ref 26.0–34.0)
MCHC: 30.6 g/dL (ref 30.0–36.0)
MCV: 87.3 fL (ref 78.0–100.0)
PLATELETS: 190 10*3/uL (ref 150–400)
RBC: 4.16 MIL/uL (ref 3.87–5.11)
RDW: 14.8 % (ref 11.5–15.5)
WBC: 5.9 10*3/uL (ref 4.0–10.5)

## 2016-07-28 LAB — APTT: APTT: 29 s (ref 24–36)

## 2016-07-28 LAB — I-STAT TROPONIN, ED: Troponin i, poc: 0 ng/mL (ref 0.00–0.08)

## 2016-07-28 LAB — URINALYSIS, ROUTINE W REFLEX MICROSCOPIC
Bilirubin Urine: NEGATIVE
Glucose, UA: NEGATIVE mg/dL
Hgb urine dipstick: NEGATIVE
Ketones, ur: NEGATIVE mg/dL
LEUKOCYTES UA: NEGATIVE
Nitrite: NEGATIVE
PROTEIN: NEGATIVE mg/dL
SPECIFIC GRAVITY, URINE: 1.02 (ref 1.005–1.030)
pH: 6 (ref 5.0–8.0)

## 2016-07-28 LAB — CBG MONITORING, ED: GLUCOSE-CAPILLARY: 90 mg/dL (ref 65–99)

## 2016-07-28 LAB — PROTIME-INR
INR: 1.15
PROTHROMBIN TIME: 14.7 s (ref 11.4–15.2)

## 2016-07-28 SURGERY — RADIOLOGY WITH ANESTHESIA
Anesthesia: General

## 2016-07-28 MED ORDER — ALTEPLASE (STROKE) FULL DOSE INFUSION
0.9000 mg/kg | Freq: Once | INTRAVENOUS | Status: AC
  Administered 2016-07-28: 76 mg via INTRAVENOUS
  Filled 2016-07-28: qty 100

## 2016-07-28 MED ORDER — EPTIFIBATIDE 20 MG/10ML IV SOLN
INTRAVENOUS | Status: AC
  Administered 2016-07-28: 2 mg
  Filled 2016-07-28: qty 10

## 2016-07-28 MED ORDER — SODIUM CHLORIDE 0.9 % IV SOLN
INTRAVENOUS | Status: DC | PRN
  Administered 2016-07-28: 22:00:00 via INTRAVENOUS

## 2016-07-28 MED ORDER — SODIUM CHLORIDE 0.9 % IV SOLN: 50.0000 mL | Freq: Once | INTRAVENOUS | Status: DC

## 2016-07-28 MED ORDER — ALTEPLASE 30 MG/30 ML FOR INTERV. RAD
1.0000 mg | INTRA_ARTERIAL | Status: DC | PRN
  Filled 2016-07-28: qty 30

## 2016-07-28 MED ORDER — NICARDIPINE HCL IN NACL 20-0.86 MG/200ML-% IV SOLN
3.0000 mg/h | INTRAVENOUS | Status: DC
  Filled 2016-07-28: qty 200

## 2016-07-28 MED ORDER — IOPAMIDOL (ISOVUE-300) INJECTION 61%
INTRAVENOUS | Status: AC
  Administered 2016-07-28: 75 mL
  Filled 2016-07-28: qty 300

## 2016-07-28 MED ORDER — PROPOFOL 10 MG/ML IV BOLUS
INTRAVENOUS | Status: DC | PRN
  Administered 2016-07-28: 120 mg via INTRAVENOUS

## 2016-07-28 MED ORDER — PHENYLEPHRINE HCL 10 MG/ML IJ SOLN
INTRAMUSCULAR | Status: DC | PRN
  Administered 2016-07-28: 40 ug via INTRAVENOUS

## 2016-07-28 MED ORDER — HYDRALAZINE HCL 20 MG/ML IJ SOLN
10.0000 mg | Freq: Once | INTRAMUSCULAR | Status: AC
  Administered 2016-07-28: 10 mg via INTRAVENOUS

## 2016-07-28 MED ORDER — PHENYLEPHRINE HCL 10 MG/ML IJ SOLN
INTRAVENOUS | Status: DC | PRN
  Administered 2016-07-28: 25 ug/min via INTRAVENOUS

## 2016-07-28 MED ORDER — FENTANYL CITRATE (PF) 100 MCG/2ML IJ SOLN
INTRAMUSCULAR | Status: AC
  Filled 2016-07-28: qty 6

## 2016-07-28 MED ORDER — SUCCINYLCHOLINE CHLORIDE 20 MG/ML IJ SOLN
INTRAMUSCULAR | Status: DC | PRN
  Administered 2016-07-28: 60 mg via INTRAVENOUS

## 2016-07-28 MED ORDER — ROCURONIUM BROMIDE 100 MG/10ML IV SOLN
INTRAVENOUS | Status: DC | PRN
  Administered 2016-07-28 (×3): 20 mg via INTRAVENOUS
  Administered 2016-07-28: 30 mg via INTRAVENOUS

## 2016-07-28 MED ORDER — FENTANYL CITRATE (PF) 100 MCG/2ML IJ SOLN
INTRAMUSCULAR | Status: DC | PRN
  Administered 2016-07-28 (×2): 50 ug via INTRAVENOUS
  Administered 2016-07-28: 100 ug via INTRAVENOUS

## 2016-07-28 MED ORDER — PROPOFOL 10 MG/ML IV BOLUS
INTRAVENOUS | Status: AC
  Filled 2016-07-28: qty 20

## 2016-07-28 MED ORDER — HYDRALAZINE HCL 20 MG/ML IJ SOLN
10.0000 mg | Freq: Once | INTRAMUSCULAR | Status: AC
  Administered 2016-07-28: 10 mg via INTRAVENOUS
  Filled 2016-07-28: qty 1

## 2016-07-28 MED ORDER — HYDRALAZINE HCL 20 MG/ML IJ SOLN
INTRAMUSCULAR | Status: AC
  Filled 2016-07-28: qty 1

## 2016-07-28 MED ORDER — NITROGLYCERIN 1 MG/10 ML FOR IR/CATH LAB
INTRA_ARTERIAL | Status: DC
Start: 2016-07-28 — End: 2016-07-28
  Filled 2016-07-28: qty 10

## 2016-07-28 MED ORDER — EPTIFIBATIDE 20 MG/10ML IV SOLN
INTRAVENOUS | Status: AC
  Filled 2016-07-28: qty 10

## 2016-07-28 MED ORDER — LIDOCAINE HCL (CARDIAC) 20 MG/ML IV SOLN
INTRAVENOUS | Status: DC | PRN
  Administered 2016-07-28: 60 mg via INTRATRACHEAL

## 2016-07-28 NOTE — ED Triage Notes (Signed)
[place] [facility], per daughter pt slumped over @1930 , upon arrival pt nonverbal, L sided paralysis, R sided gaze. +gag on arrival. 2 IV's est by EMS.

## 2016-07-28 NOTE — ED Notes (Signed)
Ok per Dr. Roseanne RenoStewart to start Donnamarie RossettiPA, Brooke, RN stroke team at bedside.

## 2016-07-28 NOTE — Procedures (Addendum)
S/P RT common carotid arteriogram,followed by near complete revascularization of occlded superior division of RT MCA with x 1pass with solitaire 4 mm x 40 mm FR retrieval device and 1.5 mg of superselective  Intraarterial intracranial INTEGRELIN achieving a TICI 2b reperfusion.

## 2016-07-28 NOTE — Anesthesia Preprocedure Evaluation (Signed)
Anesthesia Evaluation  Patient identified by MRN, date of birth, ID band Patient confused    Reviewed: Patient's Chart, lab work & pertinent test resultsPreop documentation limited or incomplete due to emergent nature of procedure.  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, Residual Symptoms    GI/Hepatic   Endo/Other    Renal/GU      Musculoskeletal   Abdominal   Peds  Hematology   Anesthesia Other Findings   Reproductive/Obstetrics                             Anesthesia Physical Anesthesia Plan  ASA: III  Anesthesia Plan: General   Post-op Pain Management:    Induction: Intravenous, Rapid sequence and Cricoid pressure planned  Airway Management Planned: Oral ETT  Additional Equipment: Arterial line  Intra-op Plan:   Post-operative Plan: Post-operative intubation/ventilation  Informed Consent:   History available from chart only and Only emergency history available  Plan Discussed with: Anesthesiologist and Surgeon  Anesthesia Plan Comments:         Anesthesia Quick Evaluation

## 2016-07-28 NOTE — ED Notes (Signed)
[facility]  Spoke with Dr. Roseanne RenoStewart concerning pt.  Pt still in A1. ED staff notified by Charge RN to transport pt to IR, shave bilateral groins, and mark pedal pulses.  IR team enroute.  Family taken to IR waiting area by NS, Chaplain paged and to IR waiting with family.

## 2016-07-28 NOTE — ED Notes (Signed)
Pt rambling incoherently at this time.

## 2016-07-28 NOTE — ED Notes (Signed)
Pt transported to IR with this RN, Nehemiah SettleBrooke (RR), Roseanne RenoStewart MD

## 2016-07-28 NOTE — ED Provider Notes (Signed)
[facility]-EMERGENCY DEPT Provider Note   CSN: [id number] Arrival date & time: 07/28/16  2028  History   Chief Complaint Chief Complaint  Patient presents with  . Code Stroke    HPI Diana Bush is a 75 y.o. female.  HPI 75 y.o. female PMH HTN presents to the Emergency Department today as Code Stroke. Last seen normal around 2000 hours. Noted acute onset of garbled speech, left sided paralysis with right sided gaze while attending church with her family. EMS was notified with code stroke called en route. Per family, pt without known hx of CVA or TIA.     Level V: Mental Status Change  Past Medical History:  Diagnosis Date  . Hypertension     Patient Active Problem List   Diagnosis Date Noted  . CVA (cerebral infarction) 07/28/2016    History reviewed. No pertinent surgical history.  OB History    No data available       Home Medications    Prior to Admission medications   Not on File    Family History No family history on file.  Social History Social History  Substance Use Topics  . Smoking status: Not on file  . Smokeless tobacco: Not on file  . Alcohol use Not on file     Allergies   Review of patient's allergies indicates no known allergies.   Review of Systems Review of Systems  Unable to perform ROS: Mental status change   Physical Exam Updated Vital Signs BP 173/98 (BP Location: Left Arm)   Pulse 82   Resp 20   Wt 84 kg   SpO2 98%   Physical Exam  Constitutional: Vital signs are normal. She appears well-developed and well-nourished.  HENT:  Head: Normocephalic and atraumatic.  Right Ear: Hearing normal.  Left Ear: Hearing normal.  Eyes: Conjunctivae are normal. Pupils are equal, round, and reactive to light.  Neck: Normal range of motion. Neck supple.  Cardiovascular: Normal rate and regular rhythm.   Pulmonary/Chest: Effort normal.  Abdominal: Soft.  Neurological: She is alert. She is disoriented. A cranial nerve deficit and sensory  deficit is present.  Pt with left homonymous hemianopsia  Pupils equal and reactive Left sided hemiplegia noted Pt follows verbal commands with RUE with motor/strenght intact. None on left.  Moderate facial weakness of left side  Skin: Skin is warm and dry.  Psychiatric: She has a normal mood and affect. Her speech is normal and behavior is normal. Thought content normal.  Nursing note and vitals reviewed.  ED Treatments / Results  Labs (all labs ordered are listed, but only abnormal results are displayed) Labs Reviewed  CBC - Abnormal; Notable for the following:       Result Value   Hemoglobin 11.1 (*)    All other components within normal limits  I-STAT CHEM 8, ED - Abnormal; Notable for the following:    BUN 26 (*)    Glucose, Bld 103 (*)    Calcium, Ion 0.97 (*)    Hemoglobin 11.9 (*)    HCT 35.0 (*)    All other components within normal limits  DIFFERENTIAL  ETHANOL  PROTIME-INR  APTT  COMPREHENSIVE METABOLIC PANEL  URINE RAPID DRUG SCREEN, HOSP PERFORMED  URINALYSIS, ROUTINE W REFLEX MICROSCOPIC (NOT AT [facility])  I-STAT TROPOININ, ED  CBG MONITORING, ED   EKG  EKG Interpretation None       Radiology Ct Head Code Stroke W/o Cm  Result Date: 07/28/2016 CLINICAL DATA:  Code stroke.  LEFT-sided weakness and facial droop. EXAM: CT HEAD WITHOUT CONTRAST TECHNIQUE: Contiguous axial images were obtained from the base of the skull through the vertex without intravenous contrast. COMPARISON:  None. FINDINGS: BRAIN: Blurring of the RIGHT insula and RIGHT frontal lobe cortex with RIGHT dense MCA/insular dot sign. No intraparenchymal hemorrhage, mass effect, midline shift. Ventricles and sulci are normal for the patient's age. Minimal white matter changes exclusive of the aforementioned abnormality compatible with chronic small vessel ischemic disease, less than expected for age. No abnormal extra-axial fluid collections. Basal cisterns are patent. VASCULAR: Mild calcific  atherosclerosis of the carotid siphons. SKULL: No skull fracture. Moderate bilateral temporomandibular osteoarthrosis. No significant scalp soft tissue swelling. SINUSES/ORBITS: The mastoid air-cells and included paranasal sinuses are well-aerated.The included ocular globes and orbital contents are non-suspicious. OTHER: None. ASPECTS [facility]([place] Stroke Program Early CT Score) - Ganglionic level infarction (caudate, lentiform nuclei, internal capsule, insula, M1-M3 cortex): 5 - Supraganglionic infarction (M4-M6 cortex): 3 Total score (0-10 with 10 being normal): 8 IMPRESSION: 1. Acute RIGHT MCA territory nonhemorrhagic infarct with dense RIGHT MCA. Otherwise negative CT HEAD for age. 2. ASPECTS is 8. Critical Value/emergent results were called by telephone at the time of interpretation on 07/28/2016 at 8:48 pm to Dr. Roseanne RenoStewart, Neurology, who verbally acknowledged these results. Electronically Signed   By: Awilda Metroourtnay  Bloomer M.D.   On: 07/28/2016 20:47    Procedures Procedures (including critical care time) CRITICAL CARE Performed by: Eston Estersyler M Jeremy Mclamb  Total critical care time: 35 minutes  Critical care time was exclusive of separately billable procedures and treating other patients.  Critical care was necessary to treat or prevent imminent or life-threatening deterioration.  Critical care was time spent personally by me on the following activities: development of treatment plan with patient and/or surrogate as well as nursing, discussions with consultants, evaluation of patient's response to treatment, examination of patient, obtaining history from patient or surrogate, ordering and performing treatments and interventions, ordering and review of laboratory studies, ordering and review of radiographic studies, pulse oximetry and re-evaluation of patient's condition.   Medications Ordered in ED Medications  alteplase (ACTIVASE) 1 mg/mL infusion 76 mg (76 mg Intravenous New Bag/Given 07/28/16 2106)    Followed  by  0.9 %  sodium chloride infusion (not administered)  nicardipine (CARDENE) 20mg  in 0.86% saline 200ml IV infusion (0.1 mg/ml) (not administered)  hydrALAZINE (APRESOLINE) 20 MG/ML injection (not administered)  hydrALAZINE (APRESOLINE) injection 10 mg (10 mg Intravenous Given 07/28/16 2057)  hydrALAZINE (APRESOLINE) injection 10 mg (10 mg Intravenous Given 07/28/16 2104)     Initial Impression / Assessment and Plan / ED Course  I have reviewed the triage vital signs and the nursing notes.  Pertinent labs & imaging results that were available during my care of the patient were reviewed by me and considered in my medical decision making (see chart for details).  Clinical Course    Final Clinical Impressions(s) / ED Diagnoses  I have reviewed and evaluated the relevant laboratory valuesI have reviewed and evaluated the relevant imaging studies. I have interpreted the relevant EKG.I have reviewed the relevant previous healthcare records.I have reviewed EMS Documentation.I obtained HPI from historian. Patient discussed with supervising physician  ED Course:  Assessment: Pt is a 75yF who presents due to Code Stroke. LSN 2000 hours. No hx CVA or TIA. On exam, VS showed hypertension 193/110. Afebrile. Lungs CTA. Heart RRR. Abdomen nontender soft. Noted left sided hemiplegia with left homonymous hemianopsia. Moderate facial weakness with garbled speech. Pt follows commands  with right hand. CT Head showed Right MCA ischemic cerebral infarction. BP controlled with hydralazine and cardizem to 167 systolic. Per Neurology (Dr. Roseanne Reno), tPA initiated as pt within window. Interventional radiology was notified. Stroke labs pending. Plan is to Admit to Neurology.    Disposition/Plan:  Admit to Neuro. TPA, IR Notified.   Supervising Physician Derwood Kaplan, MD   Final diagnoses:  Stroke (cerebrum) [facility])    New Prescriptions New Prescriptions   No medications on file     Audry Pili,  PA-C 07/29/16 0038    Derwood Kaplan, MD 07/29/16 1610

## 2016-07-28 NOTE — H&P (Signed)
Admission H&P    Chief Complaint: New-onset difficulty, right gaze and left hemiplegia.  HPI: Diana Bush is an 75 y.o. female with a history of hypertension brought to the emergency room and code stroke status following acute onset of speech, right gaze and left hemiplegia. Symptoms occurred at 8:00 PM tonight. She has no previous history of stroke nor TIA. CT scan of her head showed a small area of early infarction involving left MCA territory, as well as acute left MCA M1 thrombus. Patient was deemed a candidate for TPA which was administered IV. No significant change and clinical deficits was seen. Interventional radiology was consulted for further management. Blood pressure was elevated on initial presentation which required IV hydralazine for immediate management. NIH stroke score was 21.  LSN: 8:00 PM on 06/27/2016 tPA Given: Yes mRankin:  Past medical history: Hypertension  No past surgical history on file.  Family history: Reviewed and was noncontributory  Social History:  has no tobacco, alcohol, and drug history on file.  Allergies: Allergies not on file  Medications: Preadmission medications were unavailable.  ROS: Unobtainable due to patient's speech deficits.  Physical Examination: Blood pressure (!) 167/115, pulse 98, resp. rate 21, weight 84 kg (185 lb 3 oz), SpO2 97 %.  HEENT-  Normocephalic, no lesions, without obvious abnormality.  Normal external eye and conjunctiva.  Normal TM's bilaterally.  Normal auditory canals and external ears. Normal external nose, mucus membranes and septum.  Normal pharynx. Neck supple with no masses, nodes, nodules or enlargement. Cardiovascular - regular rate and rhythm, S1, S2 normal, no murmur, click, rub or gallop Lungs - chest clear, no wheezing, rales, normal symmetric air entry Abdomen - soft, non-tender; bowel sounds normal; no masses,  no organomegaly Extremities - no joint deformities, effusion, or  inflammation  Neurologic Examination: Mental Status: Alert, minimal speech output which was garbled. Able to follow commands with right hand. Cranial Nerves: II-dense left homonymous hemianopsia. III/IV/VI-Pupils were equal and reacted. Consistent conjugate gaze to the right side.    VII- moderate left lower facial weakness. VIII-normal. X-marked dysarthria. XI: trapezius strength/neck flexion strength normal bilaterally XII-midline tongue extension with normal strength. Motor: Left hemiplegia with slight increase in muscle tone; normal strength and tone of right extremities. Sensory: No response to noxious stimuli of left extremities. Deep Tendon Reflexes: 2+ and symmetric. Plantars: Mute on the right and extensor on the left. Carotid auscultation: Normal  Results for orders placed or performed during the hospital encounter of 07/28/16 (from the past 48 hour(s))  Protime-INR     Status: None   Collection Time: 07/28/16  8:34 PM  Result Value Ref Range   Prothrombin Time 14.7 11.4 - 15.2 seconds   INR 1.15   APTT     Status: None   Collection Time: 07/28/16  8:34 PM  Result Value Ref Range   aPTT 29 24 - 36 seconds  CBC     Status: Abnormal   Collection Time: 07/28/16  8:34 PM  Result Value Ref Range   WBC 5.9 4.0 - 10.5 K/uL   RBC 4.16 3.87 - 5.11 MIL/uL   Hemoglobin 11.1 (L) 12.0 - 15.0 g/dL   HCT 36.3 36.0 - 46.0 %   MCV 87.3 78.0 - 100.0 fL   MCH 26.7 26.0 - 34.0 pg   MCHC 30.6 30.0 - 36.0 g/dL   RDW 14.8 11.5 - 15.5 %   Platelets 190 150 - 400 K/uL  Differential     Status: None   Collection  Time: 07/28/16  8:34 PM  Result Value Ref Range   Neutrophils Relative % 46 %   Neutro Abs 2.7 1.7 - 7.7 K/uL   Lymphocytes Relative 42 %   Lymphs Abs 2.5 0.7 - 4.0 K/uL   Monocytes Relative 8 %   Monocytes Absolute 0.5 0.1 - 1.0 K/uL   Eosinophils Relative 4 %   Eosinophils Absolute 0.2 0.0 - 0.7 K/uL   Basophils Relative 0 %   Basophils Absolute 0.0 0.0 - 0.1 K/uL   Comprehensive metabolic panel     Status: Abnormal   Collection Time: 07/28/16  8:34 PM  Result Value Ref Range   Sodium 138 135 - 145 mmol/L   Potassium 3.7 3.5 - 5.1 mmol/L   Chloride 106 101 - 111 mmol/L   CO2 26 22 - 32 mmol/L   Glucose, Bld 103 (H) 65 - 99 mg/dL   BUN 21 (H) 6 - 20 mg/dL   Creatinine, Ser 0.94 0.44 - 1.00 mg/dL   Calcium 9.2 8.9 - 10.3 mg/dL   Total Protein 7.0 6.5 - 8.1 g/dL   Albumin 3.8 3.5 - 5.0 g/dL   AST 25 15 - 41 U/L   ALT 42 14 - 54 U/L   Alkaline Phosphatase 68 38 - 126 U/L   Total Bilirubin 0.7 0.3 - 1.2 mg/dL   GFR calc non Af Amer 58 (L) >60 mL/min   GFR calc Af Amer >60 >60 mL/min    Comment: (NOTE) The eGFR has been calculated using the CKD EPI equation. This calculation has not been validated in all clinical situations. eGFR's persistently <60 mL/min signify possible Chronic Kidney Disease.    Anion gap 6 5 - 15  I-stat troponin, ED (not at [facility])     Status: None   Collection Time: 07/28/16  8:37 PM  Result Value Ref Range   Troponin i, poc 0.00 0.00 - 0.08 ng/mL   Comment 3            Comment: Due to the release kinetics of cTnI, a negative result within the first hours of the onset of symptoms does not rule out myocardial infarction with certainty. If myocardial infarction is still suspected, repeat the test at appropriate intervals.   I-Stat Chem 8, ED  (not at [facility], [facility])     Status: Abnormal   Collection Time: 07/28/16  8:39 PM  Result Value Ref Range   Sodium 139 135 - 145 mmol/L   Potassium 3.7 3.5 - 5.1 mmol/L   Chloride 106 101 - 111 mmol/L   BUN 26 (H) 6 - 20 mg/dL   Creatinine, Ser 1.00 0.44 - 1.00 mg/dL   Glucose, Bld 103 (H) 65 - 99 mg/dL   Calcium, Ion 0.97 (L) 1.15 - 1.40 mmol/L   TCO2 24 0 - 100 mmol/L   Hemoglobin 11.9 (L) 12.0 - 15.0 g/dL   HCT 35.0 (L) 36.0 - 46.0 %  CBG monitoring, ED     Status: None   Collection Time: 07/28/16  8:44 PM  Result Value Ref Range   Glucose-Capillary 90 65 - 99 mg/dL    Ct Head Code Stroke W/o Cm  Result Date: 07/28/2016 CLINICAL DATA:  Code stroke.  LEFT-sided weakness and facial droop. EXAM: CT HEAD WITHOUT CONTRAST TECHNIQUE: Contiguous axial images were obtained from the base of the skull through the vertex without intravenous contrast. COMPARISON:  None. FINDINGS: BRAIN: Blurring of the RIGHT insula and RIGHT frontal lobe cortex with RIGHT dense MCA/insular dot  sign. No intraparenchymal hemorrhage, mass effect, midline shift. Ventricles and sulci are normal for the patient's age. Minimal white matter changes exclusive of the aforementioned abnormality compatible with chronic small vessel ischemic disease, less than expected for age. No abnormal extra-axial fluid collections. Basal cisterns are patent. VASCULAR: Mild calcific atherosclerosis of the carotid siphons. SKULL: No skull fracture. Moderate bilateral temporomandibular osteoarthrosis. No significant scalp soft tissue swelling. SINUSES/ORBITS: The mastoid air-cells and included paranasal sinuses are well-aerated.The included ocular globes and orbital contents are non-suspicious. OTHER: None. ASPECTS [facility] Stroke Program Early CT Score) - Ganglionic level infarction (caudate, lentiform nuclei, internal capsule, insula, M1-M3 cortex): 5 - Supraganglionic infarction (M4-M6 cortex): 3 Total score (0-10 with 10 being normal): 8 IMPRESSION: 1. Acute RIGHT MCA territory nonhemorrhagic infarct with dense RIGHT MCA. Otherwise negative CT HEAD for age. 2. ASPECTS is 8. Critical Value/emergent results were called by telephone at the time of interpretation on 07/28/2016 at 8:48 pm to Dr. Nicole Kindred, Neurology, who verbally acknowledged these results. Electronically Signed   By: Elon Alas M.D.   On: 07/28/2016 20:47    Assessment: 75 y.o. 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  Stroke Risk Factors - hypertension  Plan: 1. Admission to neuro intensive care unit for post TPA administration and IR procedures 2. MRI of the brain without contrast 3. PT consult, OT consult, Speech consult 4. Echocardiogram 5. Prophylactic therapy-Antiplatelet med: Aspirin if CT scan of the head 24 hours post TPA administration shows no significant intracranial hemorrhage 6. Risk factor modification 7. Telemetry monitoring 8. HgbA1c, fasting lipid panel  This patient is critically ill and at significant risk of neurological worsening or death, and care requires constant monitoring of vital signs, hemodynamics,respiratory and cardiac monitoring, neurological assessment, discussion with family, other specialists and medical decision making of high complexity. Total critical care time was 90 minutes.  C.R. Nicole Kindred, MD [facility] [phone]  07/28/2016, 9:42 PM

## 2016-07-28 NOTE — ED Notes (Signed)
No improvement in BP after initial dose

## 2016-07-28 NOTE — Anesthesia Procedure Notes (Signed)
Procedure Name: Intubation Date/Time: 07/28/2016 9:52 PM Performed by: Pricilla HolmBILOTTA, Denai Z Pre-anesthesia Checklist: Patient identified, Emergency Drugs available, Suction available and Patient being monitored Patient Re-evaluated:Patient Re-evaluated prior to inductionOxygen Delivery Method: Circle System Utilized Preoxygenation: Pre-oxygenation with 100% oxygen Intubation Type: IV induction Ventilation: Mask ventilation without difficulty Laryngoscope Size: Mac and 3 Grade View: Grade I Tube type: Subglottic suction tube Tube size: 7.5 mm Number of attempts: 1 Airway Equipment and Method: Stylet and Oral airway Placement Confirmation: ETT inserted through vocal cords under direct vision,  positive ETCO2 and breath sounds checked- equal and bilateral Secured at: 22 cm Tube secured with: Tape Dental Injury: Teeth and Oropharynx as per pre-operative assessment

## 2016-07-28 NOTE — ED Notes (Signed)
Dr. Roseanne RenoStewart at bedside, order for Cardene obtained. Pt's family at bedside

## 2016-07-29 ENCOUNTER — Inpatient Hospital Stay (HOSPITAL_COMMUNITY): Payer: Medicare Other

## 2016-07-29 ENCOUNTER — Encounter (HOSPITAL_COMMUNITY): Payer: Self-pay | Admitting: Adult Health

## 2016-07-29 ENCOUNTER — Other Ambulatory Visit (HOSPITAL_COMMUNITY): Payer: Self-pay

## 2016-07-29 DIAGNOSIS — I63312 Cerebral infarction due to thrombosis of left middle cerebral artery: Secondary | ICD-10-CM

## 2016-07-29 DIAGNOSIS — I480 Paroxysmal atrial fibrillation: Secondary | ICD-10-CM

## 2016-07-29 DIAGNOSIS — I4891 Unspecified atrial fibrillation: Secondary | ICD-10-CM | POA: Diagnosis present

## 2016-07-29 DIAGNOSIS — I6309 Cerebral infarction due to thrombosis of other precerebral artery: Secondary | ICD-10-CM

## 2016-07-29 LAB — MRSA PCR SCREENING: MRSA by PCR: NEGATIVE

## 2016-07-29 LAB — BLOOD GAS, ARTERIAL
ACID-BASE EXCESS: 0 mmol/L (ref 0.0–2.0)
Bicarbonate: 24.6 mmol/L (ref 20.0–28.0)
DRAWN BY: 252031
FIO2: 100
MECHVT: 450 mL
O2 Saturation: 99.6 %
PEEP: 5 cmH2O
Patient temperature: 98.6
RATE: 15 resp/min
pCO2 arterial: 43.3 mmHg (ref 32.0–48.0)
pH, Arterial: 7.374 (ref 7.350–7.450)
pO2, Arterial: 300 mmHg — ABNORMAL HIGH (ref 83.0–108.0)

## 2016-07-29 LAB — MAGNESIUM
MAGNESIUM: 1.9 mg/dL (ref 1.7–2.4)
Magnesium: 2 mg/dL (ref 1.7–2.4)

## 2016-07-29 LAB — PHOSPHORUS
PHOSPHORUS: 3.3 mg/dL (ref 2.5–4.6)
Phosphorus: 3.4 mg/dL (ref 2.5–4.6)

## 2016-07-29 LAB — CBC WITH DIFFERENTIAL/PLATELET
BASOS PCT: 0 %
Basophils Absolute: 0 10*3/uL (ref 0.0–0.1)
EOS ABS: 0 10*3/uL (ref 0.0–0.7)
EOS PCT: 0 %
HEMATOCRIT: 30.9 % — AB (ref 36.0–46.0)
Hemoglobin: 9.3 g/dL — ABNORMAL LOW (ref 12.0–15.0)
LYMPHS ABS: 0.9 10*3/uL (ref 0.7–4.0)
LYMPHS PCT: 11 %
MCH: 26.1 pg (ref 26.0–34.0)
MCHC: 30.1 g/dL (ref 30.0–36.0)
MCV: 86.8 fL (ref 78.0–100.0)
MONO ABS: 0.4 10*3/uL (ref 0.1–1.0)
MONOS PCT: 5 %
NEUTROS ABS: 7.1 10*3/uL (ref 1.7–7.7)
Neutrophils Relative %: 85 %
Platelets: 167 10*3/uL (ref 150–400)
RBC: 3.56 MIL/uL — ABNORMAL LOW (ref 3.87–5.11)
RDW: 15 % (ref 11.5–15.5)
WBC: 8.4 10*3/uL (ref 4.0–10.5)

## 2016-07-29 LAB — BASIC METABOLIC PANEL
Anion gap: 8 (ref 5–15)
BUN: 16 mg/dL (ref 6–20)
CALCIUM: 8.4 mg/dL — AB (ref 8.9–10.3)
CO2: 24 mmol/L (ref 22–32)
CREATININE: 0.75 mg/dL (ref 0.44–1.00)
Chloride: 107 mmol/L (ref 101–111)
GFR calc non Af Amer: >60 mL/min (ref >60)
Glucose, Bld: 115 mg/dL — ABNORMAL HIGH (ref 65–99)
Potassium: 3.2 mmol/L — ABNORMAL LOW (ref 3.5–5.1)
SODIUM: 139 mmol/L (ref 135–145)

## 2016-07-29 LAB — GLUCOSE, CAPILLARY: GLUCOSE-CAPILLARY: 139 mg/dL — AB (ref 65–99)

## 2016-07-29 LAB — LIPID PANEL
CHOLESTEROL: 143 mg/dL (ref 0–200)
HDL: 43 mg/dL (ref >40)
LDL Cholesterol: 90 mg/dL (ref 0–99)
TRIGLYCERIDES: 50 mg/dL (ref <150)
Total CHOL/HDL Ratio: 3.3 RATIO
VLDL: 10 mg/dL (ref 0–40)

## 2016-07-29 MED ORDER — VITAL HIGH PROTEIN PO LIQD
1000.0000 mL | ORAL | Status: DC
  Administered 2016-07-29: 1000 mL

## 2016-07-29 MED ORDER — METOPROLOL TARTRATE 5 MG/5ML IV SOLN
2.5000 mg | Freq: Once | INTRAVENOUS | Status: AC
  Administered 2016-07-29: 2.5 mg via INTRAVENOUS

## 2016-07-29 MED ORDER — SODIUM CHLORIDE 0.9 % IV SOLN
INTRAVENOUS | Status: DC
  Administered 2016-07-29 – 2016-08-02 (×6): via INTRAVENOUS

## 2016-07-29 MED ORDER — PROPOFOL 500 MG/50ML IV EMUL
INTRAVENOUS | Status: DC | PRN
  Administered 2016-07-28: 50 ug/kg/min via INTRAVENOUS

## 2016-07-29 MED ORDER — ACETAMINOPHEN 500 MG PO TABS: 1000.0000 mg | ORAL_TABLET | Freq: Four times a day (QID) | ORAL | Status: DC | PRN

## 2016-07-29 MED ORDER — FAMOTIDINE IN NACL 20-0.9 MG/50ML-% IV SOLN
20.0000 mg | Freq: Two times a day (BID) | INTRAVENOUS | Status: DC
Start: 2016-07-29 — End: 2016-08-02
  Administered 2016-07-29 – 2016-08-02 (×9): 20 mg via INTRAVENOUS
  Filled 2016-07-29 (×9): qty 50

## 2016-07-29 MED ORDER — STROKE: EARLY STAGES OF RECOVERY BOOK
Freq: Once | Status: AC
  Administered 2016-07-29: 1
  Filled 2016-07-29: qty 1

## 2016-07-29 MED ORDER — VITAL HIGH PROTEIN PO LIQD
1000.0000 mL | ORAL | Status: DC
  Administered 2016-07-29: 1000 mL
  Administered 2016-07-30: 10:00:00

## 2016-07-29 MED ORDER — FENTANYL CITRATE (PF) 100 MCG/2ML IJ SOLN
25.0000 ug | INTRAMUSCULAR | Status: DC | PRN
  Administered 2016-07-30 (×2): 25 ug via INTRAVENOUS
  Administered 2016-07-31 – 2016-08-01 (×9): 50 ug via INTRAVENOUS
  Filled 2016-07-29 (×12): qty 2

## 2016-07-29 MED ORDER — DILTIAZEM HCL 100 MG IV SOLR
5.0000 mg/h | INTRAVENOUS | Status: DC
  Administered 2016-07-29: 5 mg/h via INTRAVENOUS
  Administered 2016-07-30: 20 mg/h via INTRAVENOUS
  Administered 2016-07-30: 5 mg/h via INTRAVENOUS
  Administered 2016-07-31: 10 mg/h via INTRAVENOUS
  Filled 2016-07-29 (×5): qty 100

## 2016-07-29 MED ORDER — ACETAMINOPHEN 650 MG RE SUPP
650.0000 mg | RECTAL | Status: DC | PRN
  Administered 2016-07-31 (×2): 650 mg via RECTAL
  Filled 2016-07-29 (×2): qty 1

## 2016-07-29 MED ORDER — POTASSIUM CHLORIDE 20 MEQ/15ML (10%) PO SOLN
40.0000 meq | Freq: Once | ORAL | Status: AC
  Administered 2016-07-29: 40 meq
  Filled 2016-07-29: qty 30

## 2016-07-29 MED ORDER — CHLORHEXIDINE GLUCONATE 0.12% ORAL RINSE (MEDLINE KIT)
15.0000 mL | Freq: Two times a day (BID) | OROMUCOSAL | Status: DC
  Administered 2016-07-29 – 2016-07-30 (×3): 15 mL via OROMUCOSAL

## 2016-07-29 MED ORDER — PRO-STAT SUGAR FREE PO LIQD
30.0000 mL | Freq: Two times a day (BID) | ORAL | Status: DC
  Administered 2016-07-29: 30 mL
  Filled 2016-07-29: qty 30

## 2016-07-29 MED ORDER — PROPOFOL 1000 MG/100ML IV EMUL
5.0000 ug/kg/min | INTRAVENOUS | Status: DC
  Administered 2016-07-29: 50 ug/kg/min via INTRAVENOUS
  Administered 2016-07-29: 45 ug/kg/min via INTRAVENOUS
  Administered 2016-07-29 (×2): 50 ug/kg/min via INTRAVENOUS
  Administered 2016-07-29: 40 ug/kg/min via INTRAVENOUS
  Administered 2016-07-29 (×2): 50 ug/kg/min via INTRAVENOUS
  Administered 2016-07-30: 35 ug/kg/min via INTRAVENOUS
  Administered 2016-07-30: 30 ug/kg/min via INTRAVENOUS
  Filled 2016-07-29 (×8): qty 100

## 2016-07-29 MED ORDER — SODIUM CHLORIDE 0.9 % IV SOLN: INTRAVENOUS | Status: DC

## 2016-07-29 MED ORDER — NICARDIPINE HCL IN NACL 20-0.86 MG/200ML-% IV SOLN
5.0000 mg/h | INTRAVENOUS | Status: DC
  Filled 2016-07-29: qty 200

## 2016-07-29 MED ORDER — ACETAMINOPHEN 650 MG RE SUPP: 650.0000 mg | Freq: Four times a day (QID) | RECTAL | Status: DC | PRN

## 2016-07-29 MED ORDER — ORAL CARE MOUTH RINSE
15.0000 mL | OROMUCOSAL | Status: DC
  Administered 2016-07-29 – 2016-07-30 (×11): 15 mL via OROMUCOSAL

## 2016-07-29 MED ORDER — DIGOXIN 250 MCG PO TABS
0.2500 mg | ORAL_TABLET | Freq: Every day | ORAL | Status: DC
  Administered 2016-07-30 – 2016-08-04 (×5): 0.25 mg via ORAL
  Filled 2016-07-29 (×6): qty 1

## 2016-07-29 MED ORDER — METOPROLOL TARTRATE 5 MG/5ML IV SOLN
2.5000 mg | Freq: Once | INTRAVENOUS | Status: AC
  Administered 2016-07-29: 2.5 mg via INTRAVENOUS
  Filled 2016-07-29: qty 5

## 2016-07-29 MED ORDER — ONDANSETRON HCL 4 MG/2ML IJ SOLN: 4.0000 mg | Freq: Four times a day (QID) | INTRAMUSCULAR | Status: DC | PRN

## 2016-07-29 MED ORDER — ACETAMINOPHEN 325 MG PO TABS
650.0000 mg | ORAL_TABLET | ORAL | Status: DC | PRN
  Administered 2016-08-01 – 2016-08-03 (×3): 650 mg via ORAL
  Filled 2016-07-29 (×4): qty 2

## 2016-07-29 MED ORDER — DIGOXIN 0.25 MG/ML IJ SOLN
0.2500 mg | INTRAMUSCULAR | Status: AC
  Administered 2016-07-29 (×2): 0.25 mg via INTRAVENOUS
  Filled 2016-07-29 (×2): qty 2

## 2016-07-29 MED ORDER — PRO-STAT SUGAR FREE PO LIQD
60.0000 mL | Freq: Four times a day (QID) | ORAL | Status: DC
  Administered 2016-07-29 – 2016-07-30 (×4): 60 mL
  Filled 2016-07-29 (×3): qty 60

## 2016-07-29 NOTE — Progress Notes (Signed)
Patient transported to MRI and back to room 3M02 without complications.  

## 2016-07-29 NOTE — Care Management Note (Signed)
Case Management Note  Patient Details  Name: Diana Bush MRN: [id number] Date of Birth: 11/23/1940  Subjective/Objective:  Pt admitted on 07/28/16 with acute Rt MCA infarct s/p TPA and IR procedure.  PTA, pt resided at home with spouse; has supportive daughter.                    Action/Plan: Pt remains on ventilator; will follow for discharge planning as pt progresses.    Expected Discharge Date:                  Expected Discharge Plan:  IP Rehab Facility  In-House Referral:     Discharge planning Services  CM Consult  Post Acute Care Choice:    Choice offered to:     DME Arranged:    DME Agency:     HH Arranged:    HH Agency:     Status of Service:  In process, will continue to follow  If discussed at Long Length of Stay Meetings, dates discussed:    Additional Comments:  Quintella BatonJulie W. Luismario Coston, RN, BSN  Trauma/Neuro ICU Case Manager [phone]

## 2016-07-29 NOTE — Transfer of Care (Signed)
Immediate Anesthesia Transfer of Care Note  Patient: Diana Bush  Procedure(s) Performed: Procedure(s): RADIOLOGY WITH ANESTHESIA (N/A)  Patient Location: PACU  Anesthesia Type:General  Level of Consciousness: sedated and Patient remains intubated per anesthesia plan  Airway & Oxygen Therapy: Patient remains intubated per anesthesia plan and Patient placed on Ventilator (see vital sign flow sheet for setting)  Post-op Assessment: Report given to RN and Post -op Vital signs reviewed and stable  Post vital signs: Reviewed and stable  Last Vitals:  Vitals:   07/28/16 2115 07/28/16 2130  BP: (!) 167/115 148/56  Pulse: 98 110  Resp: 21 18    Last Pain: There were no vitals filed for this visit.       Complications: No apparent anesthesia complications

## 2016-07-29 NOTE — Progress Notes (Signed)
Referring Physician(s): Drr Lina Sayre  Supervising Physician: Julieanne Cotton  Patient Status:  Inpatient  Chief Complaint:  CVA 9/20: CEREBRAL ANGIOGRAM [[id number] (Custom)]       S/P RT common carotid arteriogram,followed by near complete revascularization of occlded superior division of RT MCA with x 1pass with solitaire 4 mm x 40 mm FR retrieval device and 1.5 mg of superselective  Intraarterial intracranial INTEGRELIN achieving a TICI 2b reperfusion.       Subjective:  On vent No response Sedated Some movement  Allergies: Review of patient's allergies indicates no known allergies.  Medications: Prior to Admission medications   Not on File     Vital Signs: BP 112/65   Pulse (!) 125   Temp 98.4 F (36.9 C) (Axillary)   Resp 17   Ht 5\' 5"  (1.651 m)   Wt 185 lb 3 oz (84 kg)   SpO2 93%   BMI 30.82 kg/m   Physical Exam  Pulmonary/Chest:  vent  Neurological:  No response No movement  Skin: Skin is warm and dry.  Rt groin with small amt oozing noted No hematoma Rt foot 2+ pulses  Nursing note and vitals reviewed.   Imaging: Ct Head Code Stroke Wo Contrast`  Result Date: 07/29/2016 CLINICAL DATA:  Code stroke. Follow-up exam status post catheter directed right MCA thrombectomy. EXAM: CT HEAD WITHOUT CONTRAST TECHNIQUE: Contiguous axial images were obtained from the base of the skull through the vertex without intravenous contrast. COMPARISON:  Prior CT an arteriogram from earlier same day. FINDINGS: Brain: Cerebral volume stable. Mild chronic microvascular ischemic disease again noted. Scattered retained contrast material present within the vasculature. No acute intracranial hemorrhage status post recent catheter directed therapy. Vague hypodensity involving the right insula suspicious for evolving ischemia. No other definite evolving infarct identified at this time. No mass lesion, midline shift or mass effect. No hydrocephalus. No extra-axial fluid  collection. Vascular: Contrast material throughout the intracranial vasculature and dural sinuses. Skull: Scalp soft tissues within normal limits.  Calvarium stable. Sinuses/Orbits: Globes and orbits within normal limits. Scattered mucosal thickening throughout the paranasal sinuses. No air-fluid level to suggest active sinus infection. No mastoid effusion. Other: No other significant finding. IMPRESSION: 1. No complication identified status post recent catheter directed therapy for right MCA embolism. 2. Evolving hypodensity within the right insular cortex, compatible with evolving ischemia. Electronically Signed   By: Rise Mu M.D.   On: 07/29/2016 00:39   Ct Head Code Stroke W/o Cm  Result Date: 07/28/2016 CLINICAL DATA:  Code stroke.  LEFT-sided weakness and facial droop. EXAM: CT HEAD WITHOUT CONTRAST TECHNIQUE: Contiguous axial images were obtained from the base of the skull through the vertex without intravenous contrast. COMPARISON:  None. FINDINGS: BRAIN: Blurring of the RIGHT insula and RIGHT frontal lobe cortex with RIGHT dense MCA/insular dot sign. No intraparenchymal hemorrhage, mass effect, midline shift. Ventricles and sulci are normal for the patient's age. Minimal white matter changes exclusive of the aforementioned abnormality compatible with chronic small vessel ischemic disease, less than expected for age. No abnormal extra-axial fluid collections. Basal cisterns are patent. VASCULAR: Mild calcific atherosclerosis of the carotid siphons. SKULL: No skull fracture. Moderate bilateral temporomandibular osteoarthrosis. No significant scalp soft tissue swelling. SINUSES/ORBITS: The mastoid air-cells and included paranasal sinuses are well-aerated.The included ocular globes and orbital contents are non-suspicious. OTHER: None. ASPECTS [employer] [facility] [employer] Stroke Program Early CT Score) - Ganglionic level infarction (caudate, lentiform nuclei, internal capsule, insula, M1-M3 cortex): 5 -  Supraganglionic infarction (M4-M6 cortex):  3 Total score (0-10 with 10 being normal): 8 IMPRESSION: 1. Acute RIGHT MCA territory nonhemorrhagic infarct with dense RIGHT MCA. Otherwise negative CT HEAD for age. 2. ASPECTS is 8. Critical Value/emergent results were called by telephone at the time of interpretation on 07/28/2016 at 8:48 pm to Dr. Roseanne RenoStewart, Neurology, who verbally acknowledged these results. Electronically Signed   By: Awilda Metroourtnay  Bloomer M.D.   On: 07/28/2016 20:47    Labs:  CBC:  Recent Labs  07/28/16 2034 07/28/16 2039 07/29/16 0500  WBC 5.9  --  8.4  HGB 11.1* 11.9* 9.3*  HCT 36.3 35.0* 30.9*  PLT 190  --  167    COAGS:  Recent Labs  07/28/16 2034  INR 1.15  APTT 29    BMP:  Recent Labs  07/28/16 2034 07/28/16 2039 07/29/16 0500  NA 138 139 139  K 3.7 3.7 3.2*  CL 106 106 107  CO2 26  --  24  GLUCOSE 103* 103* 115*  BUN 21* 26* 16  CALCIUM 9.2  --  8.4*  CREATININE 0.94 1.00 0.75  GFRNONAA 58*  --  >60  GFRAA >60  --  >60    LIVER FUNCTION TESTS:  Recent Labs  07/28/16 2034  BILITOT 0.7  AST 25  ALT 42  ALKPHOS 68  PROT 7.0  ALBUMIN 3.8    Assessment and Plan:  CVA R MCA revascularization in IR 9/20 Will follow  Electronically Signed: Alesandra Smart A 07/29/2016, 9:35 AM   I spent a total of 15 Minutes at the the patient's bedside AND on the patient's hospital floor or unit, greater than 50% of which was counseling/coordinating care for R MCA revasc

## 2016-07-29 NOTE — Progress Notes (Signed)
Neuro IR  888fr sheath pulled at 11:02 using exoseal and manual pressure.  Hemostasis obtained at 11:25.  RN Junious DresserConnie looked at site.     [place] RT-R [facility] Hines RT-R [facility] Appollonia Klee RT-R

## 2016-07-29 NOTE — Progress Notes (Signed)
Initial Nutrition Assessment  DOCUMENTATION CODES:   Obesity unspecified  INTERVENTION:    Initiate TF via OGT with Vital High Protein at goal rate of 5 ml/h (120 ml per day) and Prostat 60 ml QID to provide 920 kcals, 131 gm protein, 100 ml free water daily.  Total intake with TF + Propofol will be 1585 kcal, slightly exceeds estimated kcal needs to ensure adequate protein provision.   NUTRITION DIAGNOSIS:   Inadequate oral intake related to inability to eat as evidenced by NPO status.  GOAL:   Provide needs based on ASPEN/SCCM guidelines  MONITOR:   Vent status, Labs, Weight trends, TF tolerance, I & O's  REASON FOR ASSESSMENT:   Consult Enteral/tube feeding initiation and management  ASSESSMENT:   75yo female with acute stroke s/p systemic TPA and IR revascularization with post op vent needs.   Received MD Consult for TF initiation and management. Patient is currently intubated on ventilator support Temp (24hrs), Avg:98.1 F (36.7 C), Min:97.9 F (36.6 C), Max:98.4 F (36.9 C)  Propofol: 25.2 ml/hr providing 665 kcal per day. Nutrition focused physical exam completed.  No muscle or subcutaneous fat depletion noticed. Labs reviewed: potassium low at 3.2 Medications reviewed and include propofol.  Diet Order:  Diet NPO time specified Diet NPO time specified  Skin:  Reviewed, no issues  Last BM:  unknown  Height:   Ht Readings from Last 1 Encounters:  07/29/16 5\' 5"  (1.651 m)    Weight:   Wt Readings from Last 1 Encounters:  07/28/16 185 lb 3 oz (84 kg)    Ideal Body Weight:  56.8 kg  BMI:  Body mass index is 30.82 kg/m.  Estimated Nutritional Needs:   Kcal:  [phone]  Protein:  >/= 114 gm  Fluid:  >/= 1.5 L  EDUCATION NEEDS:   No education needs identified at this time  Joaquin CourtsKimberly Harris, RD, LDN, CNSC Pager [phone] After Hours Pager [phone]

## 2016-07-29 NOTE — Progress Notes (Signed)
STROKE TEAM PROGRESS NOTE   HISTORY OF PRESENT ILLNESS (per record) Diana ColonelMary Bush is an 75 y.o. female with a history of hypertension brought to the emergency room and code stroke status following acute onset of speech, right gaze and left hemiplegia. Symptoms occurred at 8:00 PM tonight 07/28/2016 (LKW). She has no previous history of stroke nor TIA. CT scan of her head showed a small area of early infarction involving left MCA territory, as well as acute left MCA M1 thrombus. Patient was deemed a candidate for TPA which was administered IV. No significant change and clinical deficits was seen. Interventional radiology was consulted for further management. Blood pressure was elevated on initial presentation which required IV hydralazine for immediate management. NIH stroke score was 21. Patient was taken to IR where she received received near complete revascularization of occluded superior right MCA with Solitaire and intra-arterial Integrilin with TICI 2B reperfusion. She was admitted to the neuro ICU for further evaluation and treatment.   SUBJECTIVE (INTERVAL HISTORY) Patient is sedated and intubated.daughter is at bedside.Blood pressure has been adequately controlled. No neurological changes.   OBJECTIVE Temp:  [97.4 F (36.3 C)-98.4 F (36.9 C)] 97.4 F (36.3 C) (09/21 1200) Pulse Rate:  [60-133] 119 (09/21 1500) Cardiac Rhythm: Normal sinus rhythm (09/21 0831) Resp:  [13-21] 15 (09/21 1500) BP: (84-193)/(53-135) 120/88 (09/21 1500) SpO2:  [93 %-100 %] 100 % (09/21 1500) Arterial Line BP: (108-169)/(52-99) 145/86 (09/21 1030) FiO2 (%):  [40 %-100 %] 40 % (09/21 1154) Weight:  [84 kg (185 lb 3 oz)] 84 kg (185 lb 3 oz) (09/20 2000)  CBC:  Recent Labs Lab 07/28/16 2034 07/28/16 2039 07/29/16 0500  WBC 5.9  --  8.4  NEUTROABS 2.7  --  7.1  HGB 11.1* 11.9* 9.3*  HCT 36.3 35.0* 30.9*  MCV 87.3  --  86.8  PLT 190  --  167    Basic Metabolic Panel:  Recent Labs Lab 07/28/16 2034  07/28/16 2039 07/29/16 0500 07/29/16 1013  NA 138 139 139  --   K 3.7 3.7 3.2*  --   CL 106 106 107  --   CO2 26  --  24  --   GLUCOSE 103* 103* 115*  --   BUN 21* 26* 16  --   CREATININE 0.94 1.00 0.75  --   CALCIUM 9.2  --  8.4*  --   MG  --   --   --  1.9  PHOS  --   --   --  3.4    Lipid Panel:    Component Value Date/Time   CHOL 143 07/29/2016 0500   TRIG 50 07/29/2016 0500   HDL 43 07/29/2016 0500   CHOLHDL 3.3 07/29/2016 0500   VLDL 10 07/29/2016 0500   LDLCALC 90 07/29/2016 0500   HgbA1c: No results found for: HGBA1C Urine Drug Screen:    Component Value Date/Time   LABOPIA NONE DETECTED 07/28/2016 2156   COCAINSCRNUR NONE DETECTED 07/28/2016 2156   LABBENZ NONE DETECTED 07/28/2016 2156   AMPHETMU NONE DETECTED 07/28/2016 2156   THCU NONE DETECTED 07/28/2016 2156   LABBARB NONE DETECTED 07/28/2016 2156      IMAGING  Ct Head Code Stroke W/o Cm 07/28/2016 1. Acute RIGHT MCA territory nonhemorrhagic infarct with dense RIGHT MCA. Otherwise negative CT HEAD for age. 2. ASPECTS is 8.   Cerebral angiogram 07/28/2016  S/P RT common carotid arteriogram,followed by near complete revascularization of occlded superior division of RT MCA with x 1pass  with solitaire 4 mm x 40 mm FR retrieval device and 1.5 mg of superselective  Intraarterial intracranial INTEGRELIN achieving a TICI 2b reperfusion.  Ct Head Code Stroke Wo Contrast post IR 07/29/2016 1. No complication identified status post recent catheter directed therapy for right MCA embolism. 2. Evolving hypodensity within the right insular cortex, compatible with evolving ischemia.    PHYSICAL EXAM Elderly lady who is sedated and intubated. . Afebrile. Head is nontraumatic. Neck is supple without bruit.    Cardiac exam no murmur or gallop. Lungs are clear to auscultation. Distal pulses are well felt. Neurological Exam :  Patient is on propofol drip. She is intubated. She opens eyes partially to sternal rub. . She  does not follow gaze. She did not follow commands for me but did so earlier for the nurse. She has purposeful antigravity movements in the left side. She withdraws right lower extremity to pain. Minimum movement in the right upper extremity to pain. Right plantar equivocal left downgoing. ASSESSMENT/PLAN [name] is a 75 y.o. female with history of HTN presenting with right gaze and left hemiplegia. She received IV t-PA 07/28/2016 at 2106. Taken to intervention, where she received near complete revascularization of occluded superior right MCA with Solitaire and intra-arterial Integrilin with TICI 2B revascularization.  Stroke:  Non-dominant right MCA infarct s/p IV tPA and TICI2b revascularization of R MCA thrombus with Solitaire and IA Integridin, infarct embolic secondary to new onset atrial fibrillation   Resultant  left hemiplegia, left field cut  MRI  pending   2D Echo  pending   LDL 90  HgbA1c pending  SCDs for VTE prophylaxis  Diet NPO time specified  Diet NPO time specified  No antithrombotic prior to admission, now on No antithrombotic as within 24 hours of IV TPA. Plan aspirin at 24 hours if imaging negative for hemorrhage  Ongoing aggressive stroke risk factor management  Therapy recommendations:  pending   Disposition:  small bowel obstruction   Respiratory failure  Secondary to infarct  Intubated for neuro intervention  Hold extubation at this time  CCM following  Atrial Fibrillation/Flutter, new onset  New onset with heart rates elevated, confirmed with EKG   Home anticoagulation:  None  CHA2DS2-VASc Score = 6, ?2 oral anticoagulation recommended  Age in Years:  ?28   +2    Sex:  Female   Female   +1    Hypertension History:  yes   +1     Diabetes Mellitus:  0   Congestive Heart Failure History:  0  Vascular Disease History:  0   Stroke/TIA/Thromboembolism History:  yes   +2  CCM treated heart rate 130s to 140s with metoprolol with good  results   Cardiology consult requested   Hypertensive Emergency  Blood pressure 193/110, 167/115 in setting of acute neurologic symptoms   Treated with IV hydralazine and Cardene  Stable on drip, upper 90s to 130s  Aggressive control post IR by neurointerventionalist  Hyperlipidemia  Home meds:  No statin  LDL 90, goal < 70  Add statin once able to swallow  Other Stroke Risk Factors  Obesity, Body mass index is 30.82 kg/m., recommend weight loss, diet and exercise as appropriate   Other Active Problems  Hyperkalemia, 3.2 repleted. Follow up in am.  Acute blood loss anemia status post neuro-intervention. Hemoglobin 11.1->9.3  Hypocalcemia 8.4  Hyperglycemia 103-115. HgbA1c pending   Hospital day # 1  I have personally examined this patient, reviewed notes, independently viewed imaging  studies, participated in medical decision making and plan of care.ROS completed by me personally and pertinent positives fully documented  I have made any additions or clarifications directly to the above note. Agree with note above. Patient presented with the right middle cerebral artery occlusion and was treated with IV TPA followed by mechanical embolectomy with complete recanalization. She remains at risk for neurological worsening, development of hemorrhage, cerebral edema and brain herniation. Plan to remove groin sheaths and then check MRI scan later today. Strict blood pressure control and close neurological monitoring as per post TPA protocol. I had a long discussion with the patient and daughter at the bedside and answered questions about her care. Discussed with Dr. Jamison Neighbor pulmonary critical care This patient is critically ill and at significant risk of neurological worsening, death and care requires constant monitoring of vital signs, hemodynamics,respiratory and cardiac monitoring, extensive review of multiple databases, frequent neurological assessment, discussion with family, other  specialists and medical decision making of high complexity.I have made any additions or clarifications directly to the above note.This critical care time does not reflect procedure time, or teaching time or supervisory time of PA/NP/Med Resident etc but could involve care discussion time.  I spent 40 minutes of neurocritical care time  in the care of  this patient.      Delia Heady, MD Medical Director [facility] Pager: [phone] 07/29/2016 4:51 PM     To contact Stroke Continuity provider, please refer to [email or web address]. After hours, contact General Neurology

## 2016-07-29 NOTE — Consult Note (Signed)
PULMONARY / CRITICAL CARE MEDICINE   Name: Diana Bush MRN: [id number] DOB: 1941/01/14    ADMISSION DATE:  07/28/2016 CONSULTATION DATE:  9/21  REFERRING MD:  Pearlean Brownie (stroke)   CHIEF COMPLAINT:  Vent management   HISTORY OF PRESENT ILLNESS:   75yo female with hx HTN and arthritis presented 845pm 9/20 with acute onset slurred speech, L hemiplegia.  Last seen normal at 8pm.  CT head revealed small area of early R MCA infarct and acute R MCA thrombus.  She received systemic TPA in ER.  She was then taken for IR revascularization which was successful and she remained intubated post procedure.  PCCM consulted 9/21 for vent management.   PAST MEDICAL HISTORY :  Past Medical History:  Diagnosis Date  . Hypertension   . Osteoarthritis     PAST SURGICAL HISTORY: No prior surgeries.  No Known Allergies  No current facility-administered medications on file prior to encounter.    No current outpatient prescriptions on file prior to encounter.    FAMILY HISTORY:  Family history reviewed with sisters at bedside & noncontributory.  SOCIAL HISTORY: Social History  Substance Use Topics  . Smoking status: Never Smoker  . Smokeless tobacco: Not on file  . Alcohol use Not on file    REVIEW OF SYSTEMS:   Unable to obtain.  Pt sedated on vent.  As per HPI obtained from records and RN.   SUBJECTIVE:  Awaiting sheath removal and f/u MRI today.   VITAL SIGNS: BP 112/65   Pulse (!) 125   Temp 98.4 F (36.9 C) (Axillary)   Resp 17   Ht 5\' 5"  (1.651 m)   Wt 84 kg (185 lb 3 oz)   SpO2 93%   BMI 30.82 kg/m   HEMODYNAMICS:    VENTILATOR SETTINGS: Vent Mode: PSV;CPAP FiO2 (%):  [40 %-100 %] 40 % Set Rate:  [15 bmp] 15 bmp Vt Set:  [450 mL] 450 mL PEEP:  [5 cmH20] 5 cmH20 Pressure Support:  [5 cmH20] 5 cmH20 Plateau Pressure:  [15 cmH20] 15 cmH20  INTAKE / OUTPUT: I/O last 3 completed shifts: In: 1392.5 [I.V.:1392.5] Out: 1725 [Urine:1625; Blood:100]  PHYSICAL  EXAMINATION: General:  Chronically ill appearing female, NAD on vent  Neuro:  Sedated, RASS -2 HEENT:  Mm moist, no JVD, ETT  Cardiovascular:  s1s2 rrr Lungs:  resps even non labored on PS 5/5, few scattered rhonchi  Abdomen:  Round, soft, +bs  Musculoskeletal:  Warm and dry, no edema  LABS:  BMET  Recent Labs Lab 07/28/16 2034 07/28/16 2039 07/29/16 0500  NA 138 139 139  K 3.7 3.7 3.2*  CL 106 106 107  CO2 26  --  24  BUN 21* 26* 16  CREATININE 0.94 1.00 0.75  GLUCOSE 103* 103* 115*    Electrolytes  Recent Labs Lab 07/28/16 2034 07/29/16 0500  CALCIUM 9.2 8.4*    CBC  Recent Labs Lab 07/28/16 2034 07/28/16 2039 07/29/16 0500  WBC 5.9  --  8.4  HGB 11.1* 11.9* 9.3*  HCT 36.3 35.0* 30.9*  PLT 190  --  167    Coag's  Recent Labs Lab 07/28/16 2034  APTT 29  INR 1.15    Sepsis Markers No results for input(s): LATICACIDVEN, PROCALCITON, O2SATVEN in the last 168 hours.  ABG  Recent Labs Lab 07/29/16 0112  PHART 7.374  PCO2ART 43.3  PO2ART 300*    Liver Enzymes  Recent Labs Lab 07/28/16 2034  AST 25  ALT 42  ALKPHOS  68  BILITOT 0.7  ALBUMIN 3.8    Cardiac Enzymes No results for input(s): TROPONINI, PROBNP in the last 168 hours.  Glucose  Recent Labs Lab 07/28/16 2044  GLUCAP 90    Imaging Ct Head Code Stroke Wo Contrast`  Result Date: 07/29/2016 CLINICAL DATA:  Code stroke. Follow-up exam status post catheter directed right MCA thrombectomy. EXAM: CT HEAD WITHOUT CONTRAST TECHNIQUE: Contiguous axial images were obtained from the base of the skull through the vertex without intravenous contrast. COMPARISON:  Prior CT an arteriogram from earlier same day. FINDINGS: Brain: Cerebral volume stable. Mild chronic microvascular ischemic disease again noted. Scattered retained contrast material present within the vasculature. No acute intracranial hemorrhage status post recent catheter directed therapy. Vague hypodensity involving the  right insula suspicious for evolving ischemia. No other definite evolving infarct identified at this time. No mass lesion, midline shift or mass effect. No hydrocephalus. No extra-axial fluid collection. Vascular: Contrast material throughout the intracranial vasculature and dural sinuses. Skull: Scalp soft tissues within normal limits.  Calvarium stable. Sinuses/Orbits: Globes and orbits within normal limits. Scattered mucosal thickening throughout the paranasal sinuses. No air-fluid level to suggest active sinus infection. No mastoid effusion. Other: No other significant finding. IMPRESSION: 1. No complication identified status post recent catheter directed therapy for right MCA embolism. 2. Evolving hypodensity within the right insular cortex, compatible with evolving ischemia. Electronically Signed   By: Rise Mu M.D.   On: 07/29/2016 00:39   Ct Head Code Stroke W/o Cm  Result Date: 07/28/2016 CLINICAL DATA:  Code stroke.  LEFT-sided weakness and facial droop. EXAM: CT HEAD WITHOUT CONTRAST TECHNIQUE: Contiguous axial images were obtained from the base of the skull through the vertex without intravenous contrast. COMPARISON:  None. FINDINGS: BRAIN: Blurring of the RIGHT insula and RIGHT frontal lobe cortex with RIGHT dense MCA/insular dot sign. No intraparenchymal hemorrhage, mass effect, midline shift. Ventricles and sulci are normal for the patient's age. Minimal white matter changes exclusive of the aforementioned abnormality compatible with chronic small vessel ischemic disease, less than expected for age. No abnormal extra-axial fluid collections. Basal cisterns are patent. VASCULAR: Mild calcific atherosclerosis of the carotid siphons. SKULL: No skull fracture. Moderate bilateral temporomandibular osteoarthrosis. No significant scalp soft tissue swelling. SINUSES/ORBITS: The mastoid air-cells and included paranasal sinuses are well-aerated.The included ocular globes and orbital contents are  non-suspicious. OTHER: None. ASPECTS [facility] Stroke Program Early CT Score) - Ganglionic level infarction (caudate, lentiform nuclei, internal capsule, insula, M1-M3 cortex): 5 - Supraganglionic infarction (M4-M6 cortex): 3 Total score (0-10 with 10 being normal): 8 IMPRESSION: 1. Acute RIGHT MCA territory nonhemorrhagic infarct with dense RIGHT MCA. Otherwise negative CT HEAD for age. 2. ASPECTS is 8. Critical Value/emergent results were called by telephone at the time of interpretation on 07/28/2016 at 8:48 pm to Dr. Roseanne Reno, Neurology, who verbally acknowledged these results. Electronically Signed   By: Awilda Metro M.D.   On: 07/28/2016 20:47     STUDIES:  CT Head 9/20: 1. Acute RIGHT MCA territory nonhemorrhagic infarct with dense RIGHT MCA. Otherwise negative CT HEAD for age. CT Head 9/21:  1. No complication identified status post recent catheter directed therapy for right MCA embolism. 2. Evolving hypodensity within the right insular cortex, compatible with evolving ischemia.  MICROBIOLOGY: MRSA PCR 9/21:  Negative   ANTIBIOTICS: None.  SIGNIFICANT EVENTS: 9/20 - IR revascularization   LINES/TUBES: OETT 7.5 9/20 >> L Rad Art Line 9/20 >> Foley 9/20 >> PIV x2  DISCUSSION: 75yo female with acute  stroke s/p systemic TPA and IR revascularization with post op vent needs.   ASSESSMENT / PLAN:  NEUROLOGIC Acute R MCA ischemic stroke  P:   RASS goal: -1 Propofol gtt  PRN fentanyl  Per stroke team  Pending f/u MRI 9/21   PULMONARY Respiratory insufficiency - post Neuro IR procedure in setting acute stroke  P:   Vent support - 8cc/kg  F/u CXR  F/u ABG Continue PS wean as tol  No extubation today  SBT again in am and consider extubation if mental status allows   CARDIOVASCULAR A: Hypertensive Urgency New A fib/flutter  P:  Continuous Telemetry Monitoring Vitals per unit protocol cardene gtt  SBP & MAP goals per Neuro/Neuro IR Lopressor IV x1 May need  Cardene gtt for rate control TTE pending but may need TEE if normal  RENAL Hypokalemia  P:   Replete K  F/u chem   GASTROINTESTINAL No active issue  P:   PPI  Add TF 9/21   HEMATOLOGIC No active issue s/p TPA P:  F/u CBC  SCD's   INFECTIOUS No active issue  P:   monitor wbc, fever curve off abx   ENDOCRINE No active issue  P:   Monitor glucose on chem    FAMILY  - Updates: no family available 9/21   Dirk DressKaty Whiteheart, NP 07/29/2016  10:12 AM Pager: [phone] or [phone]  PCCM Attending Note: Patient seen and examined with nurse practitioner. Please refer to her consultation note which I have reviewed in detail. Repeat brain imaging pending post revascularization. Patient with intermittent atrial fibrillation/flutter. Attempting rate control with intermittent IV Lopressor first. Deferring to neurology on systemic anticoagulation at this time. Continuing telemetry monitoring. Plan for spontaneous breathing trial in the morning and possible extubation depending upon need for transesophageal echocardiogram.  I have spent a total of 34 minutes of critical care time today caring for the patient and reviewing the patient's electronic medical record.  Donna ChristenJennings E. Jamison NeighborNestor, M.D. [facility] Pager:  [phone] After 3pm or if no response, call [phone] 12:16 PM 07/29/16

## 2016-07-29 NOTE — Progress Notes (Signed)
SLP Cancellation Note  Patient Details Name: Diana Bush MRN: [id number] DOB: 02/04/1941   Cancelled treatment:        Intubated. Will follow.   Royce MacadamiaLitaker, Jawaun Celmer Willis 07/29/2016, 7:55 AM

## 2016-07-29 NOTE — Consult Note (Signed)
CARDIOLOGY CONSULT NOTE   Patient ID: Diana Bush MRN: [id number] DOB/AGE: 03/06/1941 75 y.o.  Admit date: 07/28/2016  Requesting Physician: Dr. Leonie Man Primary Physician:   No primary care provider on file. Primary Cardiologist:  New Reason for Consultation:  Newly diagnosed afib   HPI: Diana Bush is a 75 y.o. female with a history of HTN and OA who was brought to the [facility] ED on 07/28/16 as a code stroke following acute onset of speech, right gaze and left hemiplegia. She was found to have an acute CVA s/p failed TPA and had IR guided thrombectomy. Noted to go into afib with RVR and cardiology consulted.   Symptoms occurred at 8:00 PM last night. She has no previous history of stroke nor TIA. CT scan of her head showed a small area of early infarction involving left MCA territory, as well as acute left MCA M1 thrombus. Patient was deemed a candidate for TPA which was administered IV. No significant change and clinical deficits was seen. Interventional radiology was consulted and she underwent RT common carotid arteriogram, followed by near complete revascularization of occlded superior division of RT MCA with intraarterial intracranial INTEGRELIN achieving reperfusion. Blood pressure was elevated on initial presentation which required IV hydralazine for immediate management. She remained intubated post procedure.  PCCM consulted 9/21 for vent management. Patient noted to go into atrial fib/flutter with RVR. She was given IV metoprolol which only transiently helped and then placed on dilt gtt.   Patient is currently intubated and sedated so no hx obtained.   Past Medical History:  Diagnosis Date  . Hypertension   . Osteoarthritis   . PAF (paroxysmal atrial fibrillation) ([place])      Past Surgical History:  Procedure Laterality Date  . RADIOLOGY WITH ANESTHESIA N/A 07/28/2016   Procedure: RADIOLOGY WITH ANESTHESIA;  Surgeon: Luanne Bras, MD;  Location: [place];  Service: Radiology;   Laterality: N/A;    No Known Allergies  I have reviewed the patient's current medications . chlorhexidine gluconate (MEDLINE KIT)  15 mL Mouth Rinse BID  . famotidine (PEPCID) IV  20 mg Intravenous Q12H  . feeding supplement (PRO-STAT SUGAR FREE 64)  60 mL Per Tube QID  . feeding supplement (VITAL HIGH PROTEIN)  1,000 mL Per Tube Q24H  . mouth rinse  15 mL Mouth Rinse 10 times per day   . sodium chloride 75 mL/hr at 07/29/16 1500  . diltiazem (CARDIZEM) infusion 5 mg/hr (07/29/16 1500)  . niCARDipine 4 mg/hr (07/28/16 0130)  . propofol 50 mcg/kg/min (07/29/16 1500)   acetaminophen **OR** acetaminophen, fentaNYL (SUBLIMAZE) injection  Prior to Admission medications   Not on File     Social History   Social History  . Marital status: N/A    Spouse name: N/A  . Number of children: N/A  . Years of education: N/A   Occupational History  . Not on file.   Social History Main Topics  . Smoking status: Never Smoker  . Smokeless tobacco: Not on file  . Alcohol use Not on file  . Drug use: Unknown  . Sexual activity: Not on file   Other Topics Concern  . Not on file   Social History Narrative  . No narrative on file     Family history unable to be obtained since patient is sedated   ROS:  Full 14 point review of systems complete and found to be negative unless listed above.  Physical Exam: Blood pressure 120/88, pulse (!) 119,  temperature 97.4 F (36.3 C), temperature source Axillary, resp. rate 15, height 5\' 5"  (1.651 m), weight 185 lb 3 oz (84 kg), SpO2 100 %.  General: Well developed, well nourished, female in no acute distress intubated and sedated Head: Eyes PERRLA, No xanthomas.   Normocephalic and atraumatic, oropharynx without edema or exudate.   Lungs: intubated Heart: HRRR S1 S2, no rub/gallop, Heart irregular rate and rhythm with S1, S2  murmur. pulses are 2+ extrem.   Neck: No carotid bruits. No lymphadenopathy. no JVD. Abdomen: Bowel sounds present,  abdomen soft and non-tender without masses or hernias noted. Msk:  No spine or cva tenderness. No weakness, no joint deformities or effusions. Extremities: No clubbing or cyanosis.  No le edema.  Neuro: Alert and oriented X 3. No focal deficits noted. Psych:  sedated Skin: No rashes or lesions noted.  Labs:   Lab Results  Component Value Date   WBC 8.4 07/29/2016   HGB 9.3 (L) 07/29/2016   HCT 30.9 (L) 07/29/2016   MCV 86.8 07/29/2016   PLT 167 07/29/2016    Recent Labs  07/28/16 2034  INR 1.15    Recent Labs Lab 07/28/16 2034  07/29/16 0500  NA 138  < > 139  K 3.7  < > 3.2*  CL 106  < > 107  CO2 26  --  24  BUN 21*  < > 16  CREATININE 0.94  < > 0.75  CALCIUM 9.2  --  8.4*  PROT 7.0  --   --   BILITOT 0.7  --   --   ALKPHOS 68  --   --   ALT 42  --   --   AST 25  --   --   GLUCOSE 103*  < > 115*  ALBUMIN 3.8  --   --   < > = values in this interval not displayed. Magnesium  Date Value Ref Range Status  07/29/2016 1.9 1.7 - 2.4 mg/dL Final   No results for input(s): CKTOTAL, CKMB, TROPONINI in the last 72 hours.  Recent Labs  07/28/16 2037  TROPIPOC 0.00   No results found for: PROBNP Lab Results  Component Value Date   CHOL 143 07/29/2016   HDL 43 07/29/2016   LDLCALC 90 07/29/2016   TRIG 50 07/29/2016   Echo: pending  ECG:  afib vs course flutter with RVR  Radiology:  Ct Head Code Stroke Wo Contrast`  Result Date: 07/29/2016 CLINICAL DATA:  Code stroke. Follow-up exam status post catheter directed right MCA thrombectomy. EXAM: CT HEAD WITHOUT CONTRAST TECHNIQUE: Contiguous axial images were obtained from the base of the skull through the vertex without intravenous contrast. COMPARISON:  Prior CT an arteriogram from earlier same day. FINDINGS: Brain: Cerebral volume stable. Mild chronic microvascular ischemic disease again noted. Scattered retained contrast material present within the vasculature. No acute intracranial hemorrhage status post recent  catheter directed therapy. Vague hypodensity involving the right insula suspicious for evolving ischemia. No other definite evolving infarct identified at this time. No mass lesion, midline shift or mass effect. No hydrocephalus. No extra-axial fluid collection. Vascular: Contrast material throughout the intracranial vasculature and dural sinuses. Skull: Scalp soft tissues within normal limits.  Calvarium stable. Sinuses/Orbits: Globes and orbits within normal limits. Scattered mucosal thickening throughout the paranasal sinuses. No air-fluid level to suggest active sinus infection. No mastoid effusion. Other: No other significant finding. IMPRESSION: 1. No complication identified status post recent catheter directed therapy for right MCA embolism. 2. Evolving  hypodensity within the right insular cortex, compatible with evolving ischemia. Electronically Signed   By: Rise Mu M.D.   On: 07/29/2016 00:39   Ct Head Code Stroke W/o Cm  Result Date: 07/28/2016 CLINICAL DATA:  Code stroke.  LEFT-sided weakness and facial droop. EXAM: CT HEAD WITHOUT CONTRAST TECHNIQUE: Contiguous axial images were obtained from the base of the skull through the vertex without intravenous contrast. COMPARISON:  None. FINDINGS: BRAIN: Blurring of the RIGHT insula and RIGHT frontal lobe cortex with RIGHT dense MCA/insular dot sign. No intraparenchymal hemorrhage, mass effect, midline shift. Ventricles and sulci are normal for the patient's age. Minimal white matter changes exclusive of the aforementioned abnormality compatible with chronic small vessel ischemic disease, less than expected for age. No abnormal extra-axial fluid collections. Basal cisterns are patent. VASCULAR: Mild calcific atherosclerosis of the carotid siphons. SKULL: No skull fracture. Moderate bilateral temporomandibular osteoarthrosis. No significant scalp soft tissue swelling. SINUSES/ORBITS: The mastoid air-cells and included paranasal sinuses are  well-aerated.The included ocular globes and orbital contents are non-suspicious. OTHER: None. ASPECTS [facility] Stroke Program Early CT Score) - Ganglionic level infarction (caudate, lentiform nuclei, internal capsule, insula, M1-M3 cortex): 5 - Supraganglionic infarction (M4-M6 cortex): 3 Total score (0-10 with 10 being normal): 8 IMPRESSION: 1. Acute RIGHT MCA territory nonhemorrhagic infarct with dense RIGHT MCA. Otherwise negative CT HEAD for age. 2. ASPECTS is 8. Critical Value/emergent results were called by telephone at the time of interpretation on 07/28/2016 at 8:48 pm to Dr. Roseanne Reno, Neurology, who verbally acknowledged these results. Electronically Signed   By: Awilda Metro M.D.   On: 07/28/2016 20:47    ASSESSMENT AND PLAN:    Active Problems:   CVA (cerebral infarction)   PAF (paroxysmal atrial fibrillation) (HCC)   Atrial fibrillation with RVR (HCC)  Diana Bush is a 75 y.o. female with a history of HTN and OA who was brought to the [facility] ED on 07/28/16 as a code stroke following acute onset of speech, right gaze and left hemiplegia. She was found to have an acute CVA s/p failed TPA and had IR guided thrombectomy. Noted to go into afib with RVR and cardiology consulted.   Atrial fibrillation with RVR: this is new onset. Noted to go into afib with RVR this AM. Continue IV dilt. HR with moderate control and BP soft. 2D ECHO pending. CHADSVASC score is 6 (HTN, age, F sex, CVA). She will require long term anticoagulation when cleared from a neuro standpoint.   Acute CVA: non-dominant right MCA infarct s/p IV tPA and TICI2b revascularization of R MCA thrombus with Solitaire and IA Integridin, infarct embolic secondary to new onset atrial fibrillation. Per neuro  Respiratory failure: felt to be 2/2 to infarct. Intubated for neuro intervention   Hypertensive emergency: Blood pressure 193/110, 167/115 in setting of acute neurologic symptoms. Treated with IV hydralazine and Cardene. Now  resolved.  Signed: Cline Crock, PA-C 07/29/2016 4:00 PM  Pager [phone]  Co-Sign MD  I have seen and examined the patient along with Cline Crock, PA-C.  I have reviewed the chart, notes and new data.  I agree with PA's note.  Key new complaints: unable - intubated and sedated Key examination changes: in atrial fibrillation with RVR, no overt hypervolemia/heart failure Key new findings / data: mild hypokalemia; ECG essentially normal (except arrhythmia, borderline QTc)  PLAN:  BP limits use of conventional AV node blocking agents. Will try to avoid amiodarone for now. Add digoxin.  Echo pending. Will await neuro recommendations for initiation  of anticoagulation (would go straight to a direct oral anticoagulant).  Sanda Klein, MD, [place] [phone] 07/29/2016, 4:56 PM

## 2016-07-29 NOTE — Progress Notes (Signed)
PT Cancellation Note  Patient Details Name: Diana Bush MRN: [id number] DOB: 11/23/1940   Cancelled Treatment:    Reason Eval/Treat Not Completed: Other (comment).  Pt had groin sheath removed earlier today and is on bedrest.  She also converted into a-fib with rates into the 130s and remains ventilated.  PT will check back tomorrow to see if pt is more stable to be seen.  Thanks,    Rollene Rotundaebecca B. Arleigh Dicola, PT, DPT [phone]   07/29/2016, 2:23 PM

## 2016-07-29 NOTE — Anesthesia Postprocedure Evaluation (Signed)
Anesthesia Post Note  Patient: Diana Bush  Procedure(s) Performed: Procedure(s) (LRB): RADIOLOGY WITH ANESTHESIA (N/A)  Patient location during evaluation: ICU Anesthesia Type: General Level of consciousness: sedated Pain management: pain level controlled Vital Signs Assessment: post-procedure vital signs reviewed and stable Respiratory status: patient remains intubated per anesthesia plan Cardiovascular status: stable Postop Assessment: no signs of nausea or vomiting Anesthetic complications: no    Last Vitals:  Vitals:   07/29/16 0815 07/29/16 0830  BP: 106/67 112/65  Pulse: 70 (!) 125  Resp: 18 17  Temp:      Last Pain:  Vitals:   07/29/16 0800  TempSrc: Axillary                 Diana Bush

## 2016-07-29 NOTE — Progress Notes (Signed)
Was in pt room and noted that patient converted into irregular rhythm of Atrial Fibrillation.  Told Dr. Jamison NeighborNestor and Randon GoldsmithKatie Whiteheart, NP with CCM who ordered stat EKG and 2.5 mg metoprolol. Dr Pearlean BrownieSethi was paged x2 to notify of rhythm change.  Annie MainSharon Biby returned call and was updated.  She mentioned putting in a Cardiology consult.   Will continue to monitor patient and notify physician if rate is not controlled by metoprolol.Kajsa Butrum C 07/29/16 12:50 PM

## 2016-07-29 NOTE — Progress Notes (Signed)
Chaplain was paged by nurse to support family of Pt. Chaplain was taken to IR room . Chaplain prayed with family who is very spiritual. Renato Gailsastor nad visited earlier. Chaplain provided refreshments and blankets for family. Chaplain was paged for another case. Chaplain checked back to make sure family was comfortable and reported to charge nurse.    07/29/16 0600  Clinical Encounter Type  Visited With Family  Visit Type Initial;Spiritual support;Critical Care  Referral From Nurse  Spiritual Encounters  Spiritual Needs Prayer;Emotional  Stress Factors  Family Stress Factors Health changes

## 2016-07-29 NOTE — Progress Notes (Signed)
Code stroke called on 75 y.o female with history of HTN, [facility] aphasia, dysarthria, right gaze, left vision deficit, and left side neglect. Symptoms began at 1930 tonight while at church with family. CT completed STAT showing early infarction involving left MCA territory, as well as acute left MCA M1 thrombus per Neurologist Dr. Roseanne RenoStewart. NIHSS completed yielding score of 23. TPA ordered and administered  after stabilizing Patients BP. No neuro changes after TPA started. IR team called in and Pt taken to IR suite. Pt for admit tonight.

## 2016-07-30 ENCOUNTER — Inpatient Hospital Stay (HOSPITAL_COMMUNITY): Payer: Medicare Other

## 2016-07-30 DIAGNOSIS — I639 Cerebral infarction, unspecified: Secondary | ICD-10-CM

## 2016-07-30 DIAGNOSIS — I6789 Other cerebrovascular disease: Secondary | ICD-10-CM

## 2016-07-30 DIAGNOSIS — I4891 Unspecified atrial fibrillation: Secondary | ICD-10-CM

## 2016-07-30 DIAGNOSIS — I63411 Cerebral infarction due to embolism of right middle cerebral artery: Secondary | ICD-10-CM

## 2016-07-30 LAB — PHOSPHORUS
PHOSPHORUS: 3 mg/dL (ref 2.5–4.6)
Phosphorus: 2.4 mg/dL — ABNORMAL LOW (ref 2.5–4.6)

## 2016-07-30 LAB — CBC WITH DIFFERENTIAL/PLATELET
BASOS ABS: 0 10*3/uL (ref 0.0–0.1)
Basophils Relative: 0 %
Eosinophils Absolute: 0.1 10*3/uL (ref 0.0–0.7)
Eosinophils Relative: 2 %
HEMATOCRIT: 34.4 % — AB (ref 36.0–46.0)
Hemoglobin: 10.2 g/dL — ABNORMAL LOW (ref 12.0–15.0)
LYMPHS ABS: 1.3 10*3/uL (ref 0.7–4.0)
LYMPHS PCT: 20 %
MCH: 26.4 pg (ref 26.0–34.0)
MCHC: 29.7 g/dL — ABNORMAL LOW (ref 30.0–36.0)
MCV: 89.1 fL (ref 78.0–100.0)
MONO ABS: 0.7 10*3/uL (ref 0.1–1.0)
Monocytes Relative: 11 %
NEUTROS ABS: 4.4 10*3/uL (ref 1.7–7.7)
Neutrophils Relative %: 67 %
Platelets: 151 10*3/uL (ref 150–400)
RBC: 3.86 MIL/uL — AB (ref 3.87–5.11)
RDW: 15.1 % (ref 11.5–15.5)
WBC: 6.5 10*3/uL (ref 4.0–10.5)

## 2016-07-30 LAB — HEMOGLOBIN A1C
HEMOGLOBIN A1C: 5.6 % (ref 4.8–5.6)
Mean Plasma Glucose: 114 mg/dL

## 2016-07-30 LAB — BASIC METABOLIC PANEL
ANION GAP: 7 (ref 5–15)
BUN: 19 mg/dL (ref 6–20)
CHLORIDE: 109 mmol/L (ref 101–111)
CO2: 25 mmol/L (ref 22–32)
Calcium: 8.4 mg/dL — ABNORMAL LOW (ref 8.9–10.3)
Creatinine, Ser: 0.75 mg/dL (ref 0.44–1.00)
GFR calc Af Amer: >60 mL/min (ref >60)
GLUCOSE: 92 mg/dL (ref 65–99)
POTASSIUM: 3.6 mmol/L (ref 3.5–5.1)
Sodium: 141 mmol/L (ref 135–145)

## 2016-07-30 LAB — MAGNESIUM
Magnesium: 2 mg/dL (ref 1.7–2.4)
Magnesium: 1.9 mg/dL (ref 1.7–2.4)

## 2016-07-30 LAB — GLUCOSE, CAPILLARY
GLUCOSE-CAPILLARY: 103 mg/dL — AB (ref 65–99)
GLUCOSE-CAPILLARY: 116 mg/dL — AB (ref 65–99)
GLUCOSE-CAPILLARY: 98 mg/dL (ref 65–99)
Glucose-Capillary: 86 mg/dL (ref 65–99)
Glucose-Capillary: 101 mg/dL — ABNORMAL HIGH (ref 65–99)
Glucose-Capillary: 97 mg/dL (ref 65–99)
Glucose-Capillary: 125 mg/dL — ABNORMAL HIGH (ref 65–99)

## 2016-07-30 LAB — ECHOCARDIOGRAM COMPLETE
HEIGHTINCHES: 65 in
WEIGHTICAEL: 2977.09 [oz_av]

## 2016-07-30 MED ORDER — CHLORHEXIDINE GLUCONATE 0.12 % MT SOLN
15.0000 mL | Freq: Two times a day (BID) | OROMUCOSAL | Status: DC
  Administered 2016-07-30 – 2016-08-04 (×10): 15 mL via OROMUCOSAL
  Filled 2016-07-30 (×6): qty 15

## 2016-07-30 MED ORDER — ASPIRIN 300 MG RE SUPP
300.0000 mg | Freq: Every day | RECTAL | Status: DC
  Administered 2016-07-30 – 2016-08-01 (×3): 300 mg via RECTAL
  Filled 2016-07-30 (×4): qty 1

## 2016-07-30 MED ORDER — METOPROLOL TARTRATE 5 MG/5ML IV SOLN
5.0000 mg | INTRAVENOUS | Status: DC | PRN
  Administered 2016-08-01: 5 mg via INTRAVENOUS
  Filled 2016-07-30: qty 5

## 2016-07-30 NOTE — Progress Notes (Signed)
PT Cancellation Note  Patient Details Name: Janann ColonelMary Esguerra MRN: [id number] DOB: 11/23/1940   Cancelled Treatment:    Reason Eval/Treat Not Completed: Other (comment).  Pt just extubated.  Will give her a chance to breathe.  She is also still on bedrest.  PT to check back later today or tomorrow as time allows.    Thanks,    Rollene Rotundaebecca B. Gisella Alwine, PT, DPT [phone]   07/30/2016, 11:42 AM

## 2016-07-30 NOTE — Progress Notes (Signed)
Cristopher EstimableS. Biby NP text paged for asprin order.

## 2016-07-30 NOTE — Progress Notes (Signed)
Rehab Admissions Coordinator Note:  Patient was screened by Trish MageLogue, Delbra Zellars M for appropriateness for an Inpatient Acute Rehab Consult.  At this time, we are recommending Inpatient Rehab consult.  Trish MageLogue, Compton Brigance M 07/30/2016, 5:49 PM  I can be reached at [phone].

## 2016-07-30 NOTE — Progress Notes (Signed)
PT Cancellation Note  Patient Details Name: Janann ColonelMary Aguino MRN: [id number] DOB: 11/23/1940   Cancelled Treatment:    Reason Eval/Treat Not Completed: Patient not medically ready (strict bedrest) at this time   Fabio AsaWerner, Merissa Renwick J 07/30/2016, 7:23 AM Charlotte Crumbevon Kwame Ryland, PT DPT  [phone]

## 2016-07-30 NOTE — Progress Notes (Signed)
PULMONARY / CRITICAL CARE MEDICINE   Name: Diana Bush MRN: [id number] DOB: May 03, 1941    ADMISSION DATE:  07/28/2016 CONSULTATION DATE:  9/21  REFERRING MD:  Pearlean Brownie (stroke)   CHIEF COMPLAINT:  Vent management   HISTORY OF PRESENT ILLNESS:   75yo female with hx HTN and arthritis presented 845pm 9/20 with acute onset slurred speech, L hemiplegia.  Last seen normal at 8pm.  CT head revealed small area of early R MCA infarct and acute R MCA thrombus.  She received systemic TPA in ER.  She was then taken for IR revascularization which was successful and she remained intubated post procedure.  PCCM consulted 9/21 for vent management.   SUBJECTIVE: No acute events overnight. Patient on PS trial 5/5 for hours this morning. More awake & alert.  REVIEW OF SYSTEMS:  Unable to obtain with patient on ventilator.  VITAL SIGNS: BP (!) 139/93   Pulse 81   Temp 99.9 F (37.7 C) (Axillary)   Resp 18   Ht 5\' 5"  (1.651 m)   Wt 186 lb 1.1 oz (84.4 kg)   SpO2 100%   BMI 30.96 kg/m   HEMODYNAMICS:    VENTILATOR SETTINGS: Vent Mode: PSV;CPAP FiO2 (%):  [40 %] 40 % Set Rate:  [15 bmp] 15 bmp Vt Set:  [450 mL] 450 mL PEEP:  [5 cmH20] 5 cmH20 Pressure Support:  [5 cmH20-10 cmH20] 5 cmH20 Plateau Pressure:  [14 cmH20] 14 cmH20  INTAKE / OUTPUT: I/O last 3 completed shifts: In: 3981.7 [I.V.:3676.7; NG/GT:205; IV Piggyback:100] Out: 4625 [Urine:4525; Blood:100]  PHYSICAL EXAMINATION: General:  Chronically ill appearing female. Family at bedside. Neuro:  Awake. Alert. No movement left upper & lower extremities. Following commands in right hand & foot. Attends to voice. HEENT:  Moist membranes. ETT in place. No scleral icterus. Cardiovascular:  Irregular rhythm with regular rate. No JVD or edema. Lungs:  Clear bilaterally. Normal work of breathing on PS 5/5.  Abdomen:  Soft. Protuberant. Normal bowel sounds. Musculoskeletal:  No joint deformity or effusion. Integument:  Warm & dry. No rash on  exposed skin.  LABS:  BMET  Recent Labs Lab 07/28/16 2034 07/28/16 2039 07/29/16 0500 07/30/16 0736  NA 138 139 139 141  K 3.7 3.7 3.2* 3.6  CL 106 106 107 109  CO2 26  --  24 25  BUN 21* 26* 16 19  CREATININE 0.94 1.00 0.75 0.75  GLUCOSE 103* 103* 115* 92    Electrolytes  Recent Labs Lab 07/28/16 2034 07/29/16 0500 07/29/16 1013 07/29/16 1912 07/30/16 0736  CALCIUM 9.2 8.4*  --   --  8.4*  MG  --   --  1.9 2.0 2.0  PHOS  --   --  3.4 3.3 3.0    CBC  Recent Labs Lab 07/28/16 2034 07/28/16 2039 07/29/16 0500 07/30/16 0736  WBC 5.9  --  8.4 6.5  HGB 11.1* 11.9* 9.3* 10.2*  HCT 36.3 35.0* 30.9* 34.4*  PLT 190  --  167 151    Coag's  Recent Labs Lab 07/28/16 2034  APTT 29  INR 1.15    Sepsis Markers No results for input(s): LATICACIDVEN, PROCALCITON, O2SATVEN in the last 168 hours.  ABG  Recent Labs Lab 07/29/16 0112  PHART 7.374  PCO2ART 43.3  PO2ART 300*    Liver Enzymes  Recent Labs Lab 07/28/16 2034  AST 25  ALT 42  ALKPHOS 68  BILITOT 0.7  ALBUMIN 3.8    Cardiac Enzymes No results for input(s): TROPONINI, PROBNP  in the last 168 hours.  Glucose  Recent Labs Lab 07/28/16 2044 07/29/16 2015 07/30/16 0005 07/30/16 0334 07/30/16 0834  GLUCAP 90 139* 98 103* 86    Imaging Mr Brain Wo Contrast  Result Date: 07/29/2016 CLINICAL DATA:  Continued surveillance RIGHT MCA infarction. EXAM: MRI HEAD WITHOUT CONTRAST TECHNIQUE: Multiplanar, multiecho pulse sequences of the brain and surrounding structures were obtained without intravenous contrast. COMPARISON:  Multiple priors. FINDINGS: Significant motion degradation. Brain: Large area of acute infarction within the distribution of the RIGHT MCA territory involves the insula, posterior frontal lobe, and parietal lobe, largely sparing the temporal lobe and anterior frontal lobe. Within limits for visualization on motion degraded GRE sequence, no RIGHT hemisphere parenchymal  hemorrhage. Tiny focus of chronic hemorrhage medial anterior thalamus, periventricular location, uncertain significance. Vascular: Flow voids are maintained in the internal carotid arteries, basilar artery, and LEFT greater than RIGHT vertebral arteries. Flow void has been re-established in the M1 and M2 segments of the RIGHT middle cerebral artery. Skull and upper cervical spine: Normal marrow signal. Sinuses/Orbits: Negative. Other: None. IMPRESSION: Significant progression of acute infarction compared with the preoperative code stroke CT, now with involvement of the insula, posterior frontal and anterior parietal cortex and regional white matter. No visible hemorrhagic transformation. Electronically Signed   By: Elsie StainJohn T Curnes M.D.   On: 07/29/2016 20:08     STUDIES:  CT Head 9/20: 1. Acute RIGHT MCA territory nonhemorrhagic infarct with dense RIGHT MCA. Otherwise negative CT HEAD for age. CT Head 9/21:  1. No complication identified status post recent catheter directed therapy for right MCA embolism. 2. Evolving hypodensity within the right insular cortex, compatible with evolving ischemia. MRI Brain 9/21: Progression of acute infarction compared with preoperative stroke CT scan now involving insula, posterior frontal, & anterior parietal cortex. Regional white matter changes. No visible hemorrhagic transformation.  MICROBIOLOGY: MRSA PCR 9/21:  Negative   ANTIBIOTICS: None.  SIGNIFICANT EVENTS: 9/20 - IR revascularization   LINES/TUBES: OETT 7.5 9/20 >> L Rad Art Line 9/20 >> Foley 9/20 >> PIV x2  ASSESSMENT / PLAN:  NEUROLOGIC A: Acute R MCA ischemic stroke - S/P revascularization. Sedation on Ventilator  P:   RASS goal: -1 Propofol gtt  PRN fentanyl  Management per Neurology  PULMONARY A: Respiratory insufficiency - post Neuro IR procedure in setting acute stroke   P:   Extubation this AM Weaning FiO2 for Sat >92% Incentive Spirometry q1hr while  awake  CARDIOVASCULAR A: Hypertensive Urgency New A fib/flutter  P:  Continuous Telemetry Monitoring Vitals per unit protocol Cardiology Following for arrhythmia Cardene gtt  SBP & MAP goals per Neuro/Neuro IR Digoxin Diltiazem gtt TTE Pending Systemic Anticoagulation when ok with Neurology  RENAL A: Hypokalemia - Resolved.  P:   Trending UOP Monitoring renal function & electrolytes daily Replacing electrolytes as indicated  GASTROINTESTINAL A: No active issue.  P:   Pepcid IV q12hr NPO Speech eval once extubated  HEMATOLOGIC A: Anemia - Mild. No signs of active bleeding.  P:  Trending cell counts daily w/ CBC SCD's   INFECTIOUS A: No acute infection  P:   Monitor for signs/symptoms of infection.  ENDOCRINE A: No active issue   P:   Monitor glucose on daily chem    FAMILY  - Updates: Daughter & other family updated 9/22 by Dr. Jamison NeighborNestor.   TODAY'S SUMMARY:  75 y.o. female with acute stroke s/p systemic TPA and IR revascularization with post op vent needs. Patient tolerating spontaneous breathing trial this  morning. More awake and cooperative. Dense hemiplegia on the left. Extubating this morning to nasal cannula and plan for speech evaluation of swallowing postextubation. Family updated at bedside at length by me.  I have spent a total of 32 minutes of critical care time today caring for the patient and reviewing the patient's electronic medical record.  Donna Christen Jamison Neighbor, M.D. [facility] Pulmonary [facility] Critical [facility] Pager:  [phone] After 3pm or if no response, call [phone] 10:31 AM 07/30/16

## 2016-07-30 NOTE — Progress Notes (Signed)
Pt hr avg 120s now (was 100 during MD rounds despite increase in Dilt gtt from 5-15. Cardiology paged.

## 2016-07-30 NOTE — Progress Notes (Signed)
  Echocardiogram 2D Echocardiogram has been performed.  Janalyn HarderWest, Jashira Cotugno R 07/30/2016, 3:24 PM

## 2016-07-30 NOTE — Evaluation (Signed)
Occupational Therapy Evaluation Patient Details Name: Diana Bush MRN: [id number] DOB: [birth year] Today's Date: 07/30/2016    History of Present Illness 75 y.o. 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  Pt intubated for IR proceedure and extubated 07/30/16.  Pt with significant PMhx of PAF, OA, HTN.  Pt in PAF post procedure and cardiology consulted and following.  Pt currently (at time of eval) on IV HR control meds.     Clinical Impression   Per chart review; pt independent with ADL PTA. Currently pt total assist for bed mobility and max-total assist for ADL. Pt able to tolerate sitting EOB with max support for sitting balance and wipe mouth with washcloth using her R hand. Pt able to inconsistently respond to yes/no questions with thumbs up/down but does perseverate on previous answer at times. Pt presenting with LUE weakness/impaired sensation, impaired cognition, ?visual deficits impacting her independence and safety with ADL and functional mobility. Recommending CIR level therapies to maximize independence and safety with ADL and functional mobility prior to return home. Pt would benefit from continued skilled OT to address established goals.    Follow Up Recommendations  CIR;Supervision/Assistance - 24 hour    Equipment Recommendations  Other (comment) (TBD at next venue)    Recommendations for Other Services Rehab consult     Precautions / Restrictions Precautions Precautions: Fall Precaution Comments: left sided hemiplegia Restrictions Weight Bearing Restrictions: No      Mobility Bed Mobility Overal bed mobility: +2 for physical assistance;Needs Assistance Bed Mobility: Supine to Sit;Sit to Supine     Supine to sit: +2 for physical assistance;Total assist;HOB elevated Sit to supine: +2 for physical assistance;Total assist   General bed mobility comments: Total assist of trunk and legs, pt did not initate movement of  her right leg when asked to sit up, she does grasp with her right hand, but doesn't seem to pull up to help.   Transfers                 General transfer comment: Unable today    Balance Overall balance assessment: Needs assistance Sitting-balance support: Feet supported;Single extremity supported Sitting balance-Leahy Scale: Zero Sitting balance - Comments: max assist EOB, pt pushing to the left with her strong right arm, less pushing with HOB flat.  Posterior preference as well.  Sat EOB for ~10 mins working on simple command following and attempting to bring gaze past midline.   Postural control: Posterior lean;Right lateral lean                                  ADL Overall ADL's : Needs assistance/impaired     Grooming: Maximal assistance;Sitting;Wash/dry face Grooming Details (indicate cue type and reason): Max assist for sitting balance. Pt able to perfrom hand to mouth with washcloth using RUE. Upper Body Bathing: Maximal assistance;Sitting   Lower Body Bathing: Total assistance;Bed level   Upper Body Dressing : Maximal assistance;Sitting   Lower Body Dressing: Total assistance;Bed level                 General ADL Comments: Pt in PAF with HR 120-150s jumpping around throughout session. Pt's O2 sats seemed stable on [place], Pt unable to keep saliva in her mouth. BP elevated 160s/100s.      Vision Additional Comments: Difficult to assess due to impaired cognition and communication. Needs further assessment  Perception     Praxis      Pertinent Vitals/Pain Pain Assessment: No/denies pain     Hand Dominance     Extremity/Trunk Assessment Upper Extremity Assessment Upper Extremity Assessment: LUE deficits/detail LUE Deficits / Details: No active movement noted. Does not withdraw to painful stimuli. LUE Sensation: decreased light touch LUE Coordination: decreased fine motor;decreased gross motor   Lower Extremity Assessment Lower  Extremity Assessment: Defer to PT evaluation LLE Deficits / Details: left leg with no observable motion, pt not attending to left side.  LLE Sensation:  (difficult to assess)   Cervical / Trunk Assessment Cervical / Trunk Assessment: Kyphotic   Communication Communication Communication: Expressive difficulties   Cognition Arousal/Alertness: Awake/alert Behavior During Therapy: WFL for tasks assessed/performed Overall Cognitive Status: Difficult to assess Area of Impairment: Following commands;Problem solving       Following Commands: Follows one step commands inconsistently (to the best of her physcal ability, ~75% accuracy)     Problem Solving: Decreased initiation;Requires verbal cues;Requires tactile cues (difficulty initiating when asked to sit up EOB) General Comments: Pt did well with thumbs up and down to answer questions pretty consistantly accurate.  Did well seated wtih one step command following, yet at times perseverating and repeating last command.     General Comments       Exercises       Shoulder Instructions      Home Living Family/patient expects to be discharged to:: Private residence Living Arrangements: Spouse/significant other                               Additional Comments: per chart, pt is primary caregiver for her husband who has a stroke with paralysis.        Prior Functioning/Environment Level of Independence: Independent                 OT Problem List: Decreased strength;Decreased range of motion;Decreased activity tolerance;Impaired balance (sitting and/or standing);Impaired vision/perception;Decreased coordination;Decreased cognition;Decreased safety awareness;Decreased knowledge of use of DME or AE;Decreased knowledge of precautions;Impaired sensation;Impaired tone;Obesity;Impaired UE functional use   OT Treatment/Interventions: Self-care/ADL training;Therapeutic exercise;Neuromuscular education;Energy conservation;DME  and/or AE instruction;Therapeutic activities;Cognitive remediation/compensation;Visual/perceptual remediation/compensation;Balance training;Patient/family education    OT Goals(Current goals can be found in the care plan section) Acute Rehab OT Goals Patient Stated Goal: unable to state OT Goal Formulation: With patient Time For Goal Achievement: 08/13/16 Potential to Achieve Goals: Good ADL Goals Pt Will Perform Grooming: with min assist;sitting Pt Will Transfer to Toilet: with max assist;with +2 assist;bedside commode;stand pivot transfer Additional ADL Goal #1: Pt will tolerate sitting EOB with min assist for 5 minutes as precursor to ADL. Additional ADL Goal #2: Pt will follow one step commands 100% of the time without cues.  OT Frequency: Min 2X/week   Barriers to D/C:            Co-evaluation PT/OT/SLP Co-Evaluation/Treatment: Yes Reason for Co-Treatment: Complexity of the patient's impairments (multi-system involvement);For patient/therapist safety;Necessary to address cognition/behavior during functional activity   OT goals addressed during session: ADL's and self-care      End of Session Nurse Communication: Mobility status  Activity Tolerance: Patient tolerated treatment well Patient left: in bed;with call bell/phone within reach;with SCD's reapplied   Time: 1610-9604 OT Time Calculation (min): 29 min Charges:  OT General Charges $OT Visit: 1 Procedure OT Evaluation $OT Eval High Complexity: 1 Procedure G-Codes:      Gaye Alken M.S., OTR/L  Pager: [phone]  07/30/2016, 4:43 PM

## 2016-07-30 NOTE — Progress Notes (Signed)
Called regarding elevated HR with a-fib. Cardizem gtts at 15 mg/hr. Can titrate up to 20 mg/hr as needed. Will add lopressor 5 mg IV q4hr prn for HR >130. Transition to po metoprolol when able to take po meds.  Chrystie NoseKenneth C. Abrian Hanover, MD, [facility] HospitalFACC Attending Cardiologist [facility]

## 2016-07-30 NOTE — Progress Notes (Signed)
Patient Name: Diana Bush Date of Encounter: 07/30/2016  Active Problems:   CVA (cerebral infarction)   PAF (paroxysmal atrial fibrillation) (HCC)   Atrial fibrillation with RVR ([place])   Length of Stay: 2  SUBJECTIVE  Successfully extubated. Left hemiplegia.  CURRENT MEDS . aspirin  300 mg Rectal Daily  . chlorhexidine gluconate (MEDLINE KIT)  15 mL Mouth Rinse BID  . digoxin  0.25 mg Oral Daily  . famotidine (PEPCID) IV  20 mg Intravenous Q12H  . feeding supplement (PRO-STAT SUGAR FREE 64)  60 mL Per Tube QID  . feeding supplement (VITAL HIGH PROTEIN)  1,000 mL Per Tube Q24H  . mouth rinse  15 mL Mouth Rinse 10 times per day    OBJECTIVE   Intake/Output Summary (Last 24 hours) at 07/30/16 1250 Last data filed at 07/30/16 1000  Gross per 24 hour  Intake          2265.78 ml  Output             2275 ml  Net            -9.22 ml   Filed Weights   07/28/16 2000 07/30/16 0430  Weight: 84 kg (185 lb 3 oz) 84.4 kg (186 lb 1.1 oz)    PHYSICAL EXAM Vitals:   07/30/16 1000 07/30/16 1100 07/30/16 1113 07/30/16 1200  BP: (!) 139/93 (!) 116/56 (!) 116/56   Pulse: 81 90 (!) 111   Resp: 18 20 (!) 25   Temp:    100.3 F (37.9 C)  TempSrc:    Oral  SpO2: 100% 100% 95%   Weight:      Height:       General: Alert, oriented x3, no distress Head: no evidence of trauma, PERRL, EOMI, no exophtalmos or lid lag, no myxedema, no xanthelasma; normal ears, nose and oropharynx Neck: normal jugular venous pulsations and no hepatojugular reflux; brisk carotid pulses without delay and no carotid bruits Chest: clear to auscultation, no signs of consolidation by percussion or palpation, normal fremitus, symmetrical and full respiratory excursions Cardiovascular: normal position and quality of the apical impulse, irregular rhythm, normal first and second heart sounds, no rubs or gallops, no murmur Abdomen: no tenderness or distention, no masses by palpation, no abnormal pulsatility or arterial  bruits, normal bowel sounds, no hepatosplenomegaly Extremities: no clubbing, cyanosis or edema; 2+ radial, ulnar and brachial pulses bilaterally; 2+ right femoral, posterior tibial and dorsalis pedis pulses; 2+ left femoral, posterior tibial and dorsalis pedis pulses; no subclavian or femoral bruits Neurological: grossly nonfocal  LABS  CBC  Recent Labs  07/29/16 0500 07/30/16 0736  WBC 8.4 6.5  NEUTROABS 7.1 4.4  HGB 9.3* 10.2*  HCT 30.9* 34.4*  MCV 86.8 89.1  PLT 167 964   Basic Metabolic Panel  Recent Labs  07/29/16 0500  07/29/16 1912 07/30/16 0736  NA 139  --   --  141  K 3.2*  --   --  3.6  CL 107  --   --  109  CO2 24  --   --  25  GLUCOSE 115*  --   --  92  BUN 16  --   --  19  CREATININE 0.75  --   --  0.75  CALCIUM 8.4*  --   --  8.4*  MG  --   < > 2.0 2.0  PHOS  --   < > 3.3 3.0  < > = values in this interval not displayed. Liver  Function Tests  Recent Labs  07/28/16 2034  AST 25  ALT 42  ALKPHOS 68  BILITOT 0.7  PROT 7.0  ALBUMIN 3.8   Hemoglobin A1C  Recent Labs  07/29/16 0500  HGBA1C 5.6   Fasting Lipid Panel  Recent Labs  07/29/16 0500  CHOL 143  HDL 43  LDLCALC 90  TRIG 50  CHOLHDL 3.3   Radiology Studies Imaging results have been reviewed and Mr Brain Wo Contrast  Result Date: 07/29/2016 CLINICAL DATA:  Continued surveillance RIGHT MCA infarction. EXAM: MRI HEAD WITHOUT CONTRAST TECHNIQUE: Multiplanar, multiecho pulse sequences of the brain and surrounding structures were obtained without intravenous contrast. COMPARISON:  Multiple priors. FINDINGS: Significant motion degradation. Brain: Large area of acute infarction within the distribution of the RIGHT MCA territory involves the insula, posterior frontal lobe, and parietal lobe, largely sparing the temporal lobe and anterior frontal lobe. Within limits for visualization on motion degraded GRE sequence, no RIGHT hemisphere parenchymal hemorrhage. Tiny focus of chronic hemorrhage  medial anterior thalamus, periventricular location, uncertain significance. Vascular: Flow voids are maintained in the internal carotid arteries, basilar artery, and LEFT greater than RIGHT vertebral arteries. Flow void has been re-established in the M1 and M2 segments of the RIGHT middle cerebral artery. Skull and upper cervical spine: Normal marrow signal. Sinuses/Orbits: Negative. Other: None. IMPRESSION: Significant progression of acute infarction compared with the preoperative code stroke CT, now with involvement of the insula, posterior frontal and anterior parietal cortex and regional white matter. No visible hemorrhagic transformation. Electronically Signed   By: Staci Righter M.D.   On: 07/29/2016 20:08   Ct Head Code Stroke Wo Contrast`  Result Date: 07/29/2016 CLINICAL DATA:  Code stroke. Follow-up exam status post catheter directed right MCA thrombectomy. EXAM: CT HEAD WITHOUT CONTRAST TECHNIQUE: Contiguous axial images were obtained from the base of the skull through the vertex without intravenous contrast. COMPARISON:  Prior CT an arteriogram from earlier same day. FINDINGS: Brain: Cerebral volume stable. Mild chronic microvascular ischemic disease again noted. Scattered retained contrast material present within the vasculature. No acute intracranial hemorrhage status post recent catheter directed therapy. Vague hypodensity involving the right insula suspicious for evolving ischemia. No other definite evolving infarct identified at this time. No mass lesion, midline shift or mass effect. No hydrocephalus. No extra-axial fluid collection. Vascular: Contrast material throughout the intracranial vasculature and dural sinuses. Skull: Scalp soft tissues within normal limits.  Calvarium stable. Sinuses/Orbits: Globes and orbits within normal limits. Scattered mucosal thickening throughout the paranasal sinuses. No air-fluid level to suggest active sinus infection. No mastoid effusion. Other: No other  significant finding. IMPRESSION: 1. No complication identified status post recent catheter directed therapy for right MCA embolism. 2. Evolving hypodensity within the right insular cortex, compatible with evolving ischemia. Electronically Signed   By: Jeannine Boga M.D.   On: 07/29/2016 00:39   Ct Head Code Stroke W/o Cm  Result Date: 07/28/2016 CLINICAL DATA:  Code stroke.  LEFT-sided weakness and facial droop. EXAM: CT HEAD WITHOUT CONTRAST TECHNIQUE: Contiguous axial images were obtained from the base of the skull through the vertex without intravenous contrast. COMPARISON:  None. FINDINGS: BRAIN: Blurring of the RIGHT insula and RIGHT frontal lobe cortex with RIGHT dense MCA/insular dot sign. No intraparenchymal hemorrhage, mass effect, midline shift. Ventricles and sulci are normal for the patient's age. Minimal white matter changes exclusive of the aforementioned abnormality compatible with chronic small vessel ischemic disease, less than expected for age. No abnormal extra-axial fluid collections. Basal cisterns are  patent. VASCULAR: Mild calcific atherosclerosis of the carotid siphons. SKULL: No skull fracture. Moderate bilateral temporomandibular osteoarthrosis. No significant scalp soft tissue swelling. SINUSES/ORBITS: The mastoid air-cells and included paranasal sinuses are well-aerated.The included ocular globes and orbital contents are non-suspicious. OTHER: None. ASPECTS [facility] Stroke Program Early CT Score) - Ganglionic level infarction (caudate, lentiform nuclei, internal capsule, insula, M1-M3 cortex): 5 - Supraganglionic infarction (M4-M6 cortex): 3 Total score (0-10 with 10 being normal): 8 IMPRESSION: 1. Acute RIGHT MCA territory nonhemorrhagic infarct with dense RIGHT MCA. Otherwise negative CT HEAD for age. 2. ASPECTS is 8. Critical Value/emergent results were called by telephone at the time of interpretation on 07/28/2016 at 8:48 pm to Dr. Nicole Kindred, Neurology, who verbally  acknowledged these results. Electronically Signed   By: Elon Alas M.D.   On: 07/28/2016 20:47    TELE AFib, ventricular rate around 100  ECHO pending  ASSESSMENT AND PLAN  1. AFib:  Paroxysmal, rate control borderline adequate. BP a little higher, will allow more rate control meds, but at this point waiting for swallow evaluation.  Start DOAC once OK'd by neurology service (stroke is large, but no hemorrhagic transformation on MRI) and when able to swallow. Echo pending, but other than HTN and age, she does not have any major risk factors for heart disease. If unable to swallow, change digoxin to 0.125 mg IV daily. 2. Embolic CVA with left hemiplegia 3. HTN severely elevated on admission, then rather low, now in target range for the most part. Plan to transition to oral diltiazem and/or beta blockers (+/- oral digoxin) for both HR and BP control.   Sanda Klein, MD, [facility] [phone] office [phone] pager 07/30/2016 12:50 PM

## 2016-07-30 NOTE — Evaluation (Signed)
Physical Therapy Evaluation Patient Details Name: Diana Bush MRN: [id number] DOB: April 20, 1941 Today's Date: 07/30/2016   History of Present Illness  75 y.o. female admitted to [facility] on 07/28/16 with R MCA CVA s/p tPA and clot extraction on 07/28/16.  Pt intubated for IR proceedure and extubated 07/30/16.  Pt with significant PMhx of PAF, OA, HTN.  Pt in PAF post procedure and cardiology consulted and following.  Pt currently (at time of eval) on IV HR control meds.    Clinical Impression  No observable left sided movement during our PT/OT co-eval today.  Pt is very pleasant, motivated to work hard and move.  Did well with basic, one step commands and thumbs up/down to answer questions.  Pt would benefit from extensive rehab after the acute care setting.  I have asked for a CIR screen.   PT to follow acutely for deficits listed below.          Follow Up Recommendations CIR    Equipment Recommendations  Wheelchair (measurements PT);Wheelchair cushion (measurements PT);Hospital bed;Other (comment) (hoyer lift)    Recommendations for Other Services Rehab consult     Precautions / Restrictions Precautions Precautions: Fall Precaution Comments: left sided hemiplegia      Mobility  Bed Mobility Overal bed mobility: +2 for physical assistance;Needs Assistance Bed Mobility: Supine to Sit;Sit to Supine     Supine to sit: +2 for physical assistance;Total assist;HOB elevated Sit to supine: +2 for physical assistance;Total assist   General bed mobility comments: Total assist of trunk and legs, pt did not initate movement of her right leg when asked to sit up, she does grasp with her right hand, but doesn't seem to pull up to help.   Transfers                 General transfer comment: Unable today     Modified Rankin (Stroke Patients Only) Modified Rankin (Stroke Patients Only) Pre-Morbid Rankin Score: No symptoms Modified Rankin: Severe disability     Balance Overall balance  assessment: Needs assistance Sitting-balance support: Feet supported;Single extremity supported Sitting balance-Leahy Scale: Zero Sitting balance - Comments: max assist EOB, pt pushing to the left with her strong right arm, less pushing with HOB flat.  Posterior preference as well.  Sat EOB for ~10 mins working on simple command following and attempting to bring gaze past midline.   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      Prior Function Level of Independence: Independent                  Extremity/Trunk Assessment   Upper Extremity Assessment: Defer to OT evaluation           Lower Extremity Assessment: LLE deficits/detail   LLE Deficits / Details: left leg with no observable motion, pt not attending to left side.   Cervical / Trunk Assessment: Kyphotic  Communication   Communication: Expressive difficulties  Cognition Arousal/Alertness: Awake/alert Behavior During Therapy: WFL for tasks assessed/performed Overall Cognitive  Status: Difficult to assess Area of Impairment: Following commands;Problem solving       Following Commands: Follows one step commands inconsistently (to the best of her physcal ability, ~75% accuracy)     Problem Solving: Decreased initiation;Requires verbal cues;Requires tactile cues (difficulty initiating when asked to sit up EOB) General Comments: Pt did well with thumbs up and down to answer questions pretty consistantly accurate.  Did well seated wtih one step command following, yet at times perseverating and repeating last command.      General Comments General comments  (skin integrity, edema, etc.): Pt in PAF with HR 120-150s jumpping around throughout session.  Pt's O2 sats seemed stable on [place], Pt unable to keep saliva in her mouth.  BP elevated 160s/100s.          Assessment/Plan    PT Assessment Patient needs continued PT services  PT Problem List Decreased strength;Decreased activity tolerance;Decreased balance;Decreased mobility;Decreased coordination;Decreased cognition;Decreased safety awareness;Decreased knowledge of use of DME;Decreased knowledge of precautions;Cardiopulmonary status limiting activity;Impaired sensation;Impaired tone;Obesity;Decreased range of motion          PT Treatment Interventions DME instruction;Stair training;Functional mobility training;Therapeutic activities;Therapeutic exercise;Balance training;Neuromuscular re-education;Cognitive remediation;Patient/family education;Wheelchair mobility training;Manual techniques    PT Goals (Current goals can be found in the Care Plan section)  Acute Rehab PT Goals Patient Stated Goal: unable to state PT Goal Formulation: Patient unable to participate in goal setting Time For Goal Achievement: 08/13/16 Potential to Achieve Goals: Good    Frequency Min 4X/week   Barriers to discharge Decreased caregiver support she is primary caregiver for her husband who also has a stroke.        End of Session Equipment Utilized During Treatment: Oxygen Activity Tolerance: Patient limited by fatigue Patient left: in bed;with call bell/phone within reach;with nursing/sitter in room Nurse Communication: Mobility status         Time: [id number] PT Time Calculation (min) (ACUTE ONLY): 29 min   Charges:   PT Evaluation $PT Eval Moderate Complexity: 1 Procedure          Joani Cosma B. Theoren Palka, PT, DPT [phone]   07/30/2016, 2:52 PM

## 2016-07-30 NOTE — Procedures (Signed)
Extubation Procedure Note  Patient Details:   Name: Diana Bush DOB: 11/23/1940 MRN: [id number]   Airway Documentation:     Evaluation  O2 sats: stable throughout Complications: No apparent complications Patient did tolerate procedure well. Bilateral Breath Sounds: Rhonchi   Yes   PT extubated to 2l [place] per MD order.  Positive cuff leak noted.  No evidence of stridor.  Pt able to speak.  Sats currently at 95%, vitals stable.  No complications noted.  Will continue to monitor.   Marissa Calamitynnemarie N [place] 07/30/2016, 11:18 AM

## 2016-07-30 NOTE — Progress Notes (Addendum)
STROKE TEAM PROGRESS NOTE   HISTORY OF PRESENT ILLNESS (per record) Diana Bush is an 75 y.o. female with a history of hypertension brought to the emergency room and code stroke status following acute onset of speech, right gaze and left hemiplegia. Symptoms occurred at 8:00 PM tonight 07/28/2016 ([facility]KW). She has no previous history of stroke nor TIA. CT scan of her head showed a small area of early infarction involving left MCA territory, as well as acute left MCA M1 thrombus. Patient was deemed a candidate for TPA which was administered IV. No significant change and clinical deficits was seen. Interventional radiology was consulted for further management. Blood pressure was elevated on initial presentation which required IV hydralazine for immediate management. NIH stroke score was 21. Patient was taken to IR where she received received near complete revascularization of occluded superior right MCA with Solitaire and intra-arterial Integrilin with TICI 2B reperfusion. She was admitted to the neuro ICU for further evaluation and treatment.   SUBJECTIVE (INTERVAL HISTORY) Patient is sedated and intubated.daughter and multiple family members at bedside. Blood pressure has been well controlled. No neurological worsening, she is improved and following commands. She is in atrial fib with rvr. Cardiology following. Had a discussion with family and at this point will work towards extubation and supportive care.   OBJECTIVE Temp:  [97.4 F (36.3 C)-99.2 F (37.3 C)] 99.1 F (37.3 C) (09/22 0400) Pulse Rate:  [33-153] 95 (09/22 0700) Cardiac Rhythm: Atrial fibrillation (09/21 2000) Resp:  [13-21] 18 (09/22 0700) BP: (87-166)/(54-107) 122/107 (09/22 0700) SpO2:  [79 %-100 %] 100 % (09/22 0700) Arterial Line BP: (130-166)/(60-94) 150/89 (09/21 1930) FiO2 (%):  [40 %] 40 % (09/22 0340) Weight:  [186 lb 1.1 oz (84.4 kg)] 186 lb 1.1 oz (84.4 kg) (09/22 0430)  CBC:   Recent Labs Lab 07/28/16 2034  07/28/16 2039 07/29/16 0500  WBC 5.9  --  8.4  NEUTROABS 2.7  --  7.1  HGB 11.1* 11.9* 9.3*  HCT 36.3 35.0* 30.9*  MCV 87.3  --  86.8  PLT 190  --  167    Basic Metabolic Panel:   Recent Labs Lab 07/28/16 2034 07/28/16 2039 07/29/16 0500 07/29/16 1013 07/29/16 1912  NA 138 139 139  --   --   K 3.7 3.7 3.2*  --   --   CL 106 106 107  --   --   CO2 26  --  24  --   --   GLUCOSE 103* 103* 115*  --   --   BUN 21* 26* 16  --   --   CREATININE 0.94 1.00 0.75  --   --   CALCIUM 9.2  --  8.4*  --   --   MG  --   --   --  1.9 2.0  PHOS  --   --   --  3.4 3.3    Lipid Panel:     Component Value Date/Time   CHOL 143 07/29/2016 0500   TRIG 50 07/29/2016 0500   HDL 43 07/29/2016 0500   CHOLHDL 3.3 07/29/2016 0500   VLDL 10 07/29/2016 0500   LDLCALC 90 07/29/2016 0500   HgbA1c:  Lab Results  Component Value Date   HGBA1C 5.6 07/29/2016   Urine Drug Screen:     Component Value Date/Time   LABOPIA NONE DETECTED 07/28/2016 2156   COCAINSCRNUR NONE DETECTED 07/28/2016 2156   LABBENZ NONE DETECTED 07/28/2016 2156   AMPHETMU NONE DETECTED 07/28/2016 2156  THCU NONE DETECTED 07/28/2016 2156   LABBARB NONE DETECTED 07/28/2016 2156      IMAGING  MRI of the brain 07/29/2016: Significant progression of acute infarction compared with the preoperative code stroke CT, now with involvement of the insula,posterior frontal and anterior parietal cortex and regional white matter. No visible hemorrhagic transformation.  Ct Head Code Stroke W/o Cm 07/28/2016 1. Acute RIGHT MCA territory nonhemorrhagic infarct with dense RIGHT MCA. Otherwise negative CT HEAD for age. 2. ASPECTS is 8.   Cerebral angiogram 07/28/2016  S/P RT common carotid arteriogram,followed by near complete revascularization of occlded superior division of RT MCA with x 1pass with solitaire 4 mm x 40 mm FR retrieval device and 1.5 mg of superselective  Intraarterial intracranial INTEGRELIN achieving a TICI 2b  reperfusion.  Ct Head Code Stroke Wo Contrast post IR 07/29/2016 1. No complication identified status post recent catheter directed therapy for right MCA embolism. 2. Evolving hypodensity within the right insular cortex, compatible with evolving ischemia.    PHYSICAL EXAM Elderly lady who is sedated and intubated. . Afebrile. Head is nontraumatic. Neck is supple without bruit.    Cardiac exam irregular rate  no murmur or gallop. Lungs are clear to auscultation. Distal pulses are well felt. Neurological Exam :  Patient is on propofol drip stopped 5 minutes ago. She is intubated. She opens eyes partially to sternal rub. Right gaze preference.  She does not follow gaze. She did not follow commands for me but did so earlier for the nurse(she held up 2 fingers). She has purposeful antigravity movements in the right side. She withdraws right lower extremity to light stimulation. Minimum movement in the left upper extremity to pain. Right plantar equivocal left downgoing. ASSESSMENT/PLAN [name] is a 75 y.o. female with history of HTN presenting with right gaze and left hemiplegia. She received IV t-PA 07/28/2016 at 2106. Taken to intervention, where she received near complete revascularization of occluded superior right MCA with Solitaire and intra-arterial Integrilin with TICI 2B revascularization.  Stroke:  Non-dominant right MCA infarct s/p IV tPA and TICI2b revascularization of R MCA thrombus with Solitaire and IA Integridin, infarct embolic secondary to new onset atrial fibrillation   Resultant  left hemiplegia, left field cut   MRI  progression of acute infarction now with involvement of the insula,posterior frontal and anterior parietal cortex and regional white matter. No visible hemorrhagic transformation.    2D Echo  pending   LDL 90  HgbA1c 5.6  SCDs for VTE prophylaxis Diet NPO time specified  No antithrombotic prior to admission, now on No antithrombotic as within 24  hours of IV TPA. Start Aspirin.  Ongoing aggressive stroke risk factor management  Therapy recommendations:  pending   Disposition:  small bowel obstruction   Respiratory failure  Secondary to infarct  Intubated for neuro intervention, wean as tolerated  CCM following  Atrial Fibrillation/Flutter, new onset  New onset with heart rates elevated, confirmed with EKG   Home anticoagulation:  None  CHA2DS2-VASc Score = 6, ?2 oral anticoagulation recommended  Age in Years:  ?3   +2    Sex:  Female   Female   +1    Hypertension History:  yes   +1     Diabetes Mellitus:  0   Congestive Heart Failure History:  0  Vascular Disease History:  0   Stroke/TIA/Thromboembolism History:  yes   +2  CCM treated heart rate 130s to 140s with metoprolol with good results   Cardiology  consult requested   Recommend anticoagulation may need to hold for 1-2 weeks given size of stroke  Hypertensive Emergency  Blood pressure 193/110, 167/115 in setting of acute neurologic symptoms   Treated with IV hydralazine and Cardene  Stable on drip, upper 90s to 130s  Aggressive control post IR by neurointerventionalist  Hyperlipidemia  Home meds:  No statin  LDL 90, goal < 70  Add statin once able to swallow  Other Stroke Risk Factors  Obesity, Body mass index is 30.96 kg/m., recommend weight loss, diet and exercise as appropriate   Other Active Problems  Hyperkalemia, 3.2 repleted. Follow up in am.  Acute blood loss anemia status post neuro-intervention. Hemoglobin 11.1->9.3  Hypocalcemia 8.4  Hyperglycemia 103-115. HgbA1c pending   Hospital day # 2  I have personally examined this patient, reviewed notes, independently viewed imaging studies, participated in medical decision making and plan of care.ROS completed by me personally and pertinent positives fully documented  I have made any additions or clarifications directly to the above note. Agree with note above. Patient presented  with the right middle cerebral artery occlusion in the setting of new onset afib and was treated with IV TPA followed by mechanical embolectomy with complete recanalization. Plan is to extubate patient, start anticoagulation in 1-2 weeks, start a start, strict blood pressure control.    Personally examined patient and images, and have participated in and made any corrections needed to history, physical, neuro exam,assessment and plan as stated above.  I have personally obtained the history, evaluated lab date, reviewed imaging studies and agree with radiology interpretations. A total of 35 minutes was spent in with this patient. Over half this time was spent on the diagnosis management and different therapeutic options available.     Naomie DeanAntonia Ahern, MD Neurology [facility] \   To contact Stroke Continuity provider, please refer to [email or web address]. After hours, contact General Neurology

## 2016-07-30 NOTE — Progress Notes (Signed)
Referring Physician(s): Dr Lina Sayre  Supervising Physician: Julieanne Cotton  Patient Status:  Inpatient  Chief Complaint:  CVA R MCA revasc 9/20   Subjective:  Extubated Better Moving left to command Moves Lt hand/arm and foot Tries to communicate  Allergies: Review of patient's allergies indicates no known allergies.  Medications: Prior to Admission medications   Not on File     Vital Signs: BP (!) 116/56   Pulse (!) 111   Temp 99.9 F (37.7 C) (Axillary)   Resp (!) 25   Ht 5\' 5"  (1.651 m)   Wt 186 lb 1.1 oz (84.4 kg)   SpO2 95%   BMI 30.96 kg/m   Physical Exam  Constitutional:  Face asymmetrical Tongue to left Attempts to smile  Musculoskeletal:  Squeezes with Left hand Moves hand and arm to command Pushes and pulls left foot Good pulse Rt foot   Neurological: She is alert.  Skin: Skin is warm and dry.  Nursing note and vitals reviewed.   Imaging: Mr Brain Wo Contrast  Result Date: 07/29/2016 CLINICAL DATA:  Continued surveillance RIGHT MCA infarction. EXAM: MRI HEAD WITHOUT CONTRAST TECHNIQUE: Multiplanar, multiecho pulse sequences of the brain and surrounding structures were obtained without intravenous contrast. COMPARISON:  Multiple priors. FINDINGS: Significant motion degradation. Brain: Large area of acute infarction within the distribution of the RIGHT MCA territory involves the insula, posterior frontal lobe, and parietal lobe, largely sparing the temporal lobe and anterior frontal lobe. Within limits for visualization on motion degraded GRE sequence, no RIGHT hemisphere parenchymal hemorrhage. Tiny focus of chronic hemorrhage medial anterior thalamus, periventricular location, uncertain significance. Vascular: Flow voids are maintained in the internal carotid arteries, basilar artery, and LEFT greater than RIGHT vertebral arteries. Flow void has been re-established in the M1 and M2 segments of the RIGHT middle cerebral artery. Skull and  upper cervical spine: Normal marrow signal. Sinuses/Orbits: Negative. Other: None. IMPRESSION: Significant progression of acute infarction compared with the preoperative code stroke CT, now with involvement of the insula, posterior frontal and anterior parietal cortex and regional white matter. No visible hemorrhagic transformation. Electronically Signed   By: Elsie Stain M.D.   On: 07/29/2016 20:08   Ct Head Code Stroke Wo Contrast`  Result Date: 07/29/2016 CLINICAL DATA:  Code stroke. Follow-up exam status post catheter directed right MCA thrombectomy. EXAM: CT HEAD WITHOUT CONTRAST TECHNIQUE: Contiguous axial images were obtained from the base of the skull through the vertex without intravenous contrast. COMPARISON:  Prior CT an arteriogram from earlier same day. FINDINGS: Brain: Cerebral volume stable. Mild chronic microvascular ischemic disease again noted. Scattered retained contrast material present within the vasculature. No acute intracranial hemorrhage status post recent catheter directed therapy. Vague hypodensity involving the right insula suspicious for evolving ischemia. No other definite evolving infarct identified at this time. No mass lesion, midline shift or mass effect. No hydrocephalus. No extra-axial fluid collection. Vascular: Contrast material throughout the intracranial vasculature and dural sinuses. Skull: Scalp soft tissues within normal limits.  Calvarium stable. Sinuses/Orbits: Globes and orbits within normal limits. Scattered mucosal thickening throughout the paranasal sinuses. No air-fluid level to suggest active sinus infection. No mastoid effusion. Other: No other significant finding. IMPRESSION: 1. No complication identified status post recent catheter directed therapy for right MCA embolism. 2. Evolving hypodensity within the right insular cortex, compatible with evolving ischemia. Electronically Signed   By: Rise Mu M.D.   On: 07/29/2016 00:39   Ct Head Code  Stroke W/o Cm  Result Date:  07/28/2016 CLINICAL DATA:  Code stroke.  LEFT-sided weakness and facial droop. EXAM: CT HEAD WITHOUT CONTRAST TECHNIQUE: Contiguous axial images were obtained from the base of the skull through the vertex without intravenous contrast. COMPARISON:  None. FINDINGS: BRAIN: Blurring of the RIGHT insula and RIGHT frontal lobe cortex with RIGHT dense MCA/insular dot sign. No intraparenchymal hemorrhage, mass effect, midline shift. Ventricles and sulci are normal for the patient's age. Minimal white matter changes exclusive of the aforementioned abnormality compatible with chronic small vessel ischemic disease, less than expected for age. No abnormal extra-axial fluid collections. Basal cisterns are patent. VASCULAR: Mild calcific atherosclerosis of the carotid siphons. SKULL: No skull fracture. Moderate bilateral temporomandibular osteoarthrosis. No significant scalp soft tissue swelling. SINUSES/ORBITS: The mastoid air-cells and included paranasal sinuses are well-aerated.The included ocular globes and orbital contents are non-suspicious. OTHER: None. ASPECTS [facility]([place] Stroke Program Early CT Score) - Ganglionic level infarction (caudate, lentiform nuclei, internal capsule, insula, M1-M3 cortex): 5 - Supraganglionic infarction (M4-M6 cortex): 3 Total score (0-10 with 10 being normal): 8 IMPRESSION: 1. Acute RIGHT MCA territory nonhemorrhagic infarct with dense RIGHT MCA. Otherwise negative CT HEAD for age. 2. ASPECTS is 8. Critical Value/emergent results were called by telephone at the time of interpretation on 07/28/2016 at 8:48 pm to Dr. Roseanne RenoStewart, Neurology, who verbally acknowledged these results. Electronically Signed   By: Awilda Metroourtnay  Bloomer M.D.   On: 07/28/2016 20:47    Labs:  CBC:  Recent Labs  07/28/16 2034 07/28/16 2039 07/29/16 0500 07/30/16 0736  WBC 5.9  --  8.4 6.5  HGB 11.1* 11.9* 9.3* 10.2*  HCT 36.3 35.0* 30.9* 34.4*  PLT 190  --  167 151    COAGS:  Recent  Labs  07/28/16 2034  INR 1.15  APTT 29    BMP:  Recent Labs  07/28/16 2034 07/28/16 2039 07/29/16 0500 07/30/16 0736  NA 138 139 139 141  K 3.7 3.7 3.2* 3.6  CL 106 106 107 109  CO2 26  --  24 25  GLUCOSE 103* 103* 115* 92  BUN 21* 26* 16 19  CALCIUM 9.2  --  8.4* 8.4*  CREATININE 0.94 1.00 0.75 0.75  GFRNONAA 58*  --  >60 >60  GFRAA >60  --  >60 >60    LIVER FUNCTION TESTS:  Recent Labs  07/28/16 2034  BILITOT 0.7  AST 25  ALT 42  ALKPHOS 68  PROT 7.0  ALBUMIN 3.8    Assessment and Plan:  CVA R MCA revasc 9/20 Will follow  Electronically Signed: Zohair Epp A 07/30/2016, 11:59 AM   I spent a total of 15 Minutes at the the patient's bedside AND on the patient's hospital floor or unit, greater than 50% of which was counseling/coordinating care for R MCA revasc

## 2016-07-30 NOTE — Care Management (Signed)
Received message that patient's daughter Lurena JoinerRebecca and son Freida Busmanllen [phone] want to speak  to nurse case manager  regarding discharge planning. Spoke with patient's daughter Lurena JoinerRebecca [phone] via phone . Explained her mom was extubated this am and PT / OT will see and make recommendations. Lurena JoinerRebecca voiced understanding . Stated she wanted to discuss home care for her father . Patient was the primary care giver for her husband . He had a stroke a year ago and is "paralized on one side". Explained NCM could leave private duty sitter list in patient's room , family will have to arrange and pay . If they want home health for their father they will have to go through his PCP . Daughter wanted NCM to recommend agencies ,explained can not do this but will [facility] and Private Duty List in her mother's room . Lurena JoinerRebecca was very appreciative , and stated she had no further questions.  Ronny FlurryHeather Aunna Snooks RN BSN [phone]

## 2016-07-30 NOTE — Progress Notes (Signed)
OT Cancellation Note  Patient Details Name: Diana Bush MRN: [id number] DOB: 11/23/1940   Cancelled Treatment:    Reason Eval/Treat Not Completed: Patient not medically ready (strict bedrest orders).  Gaye AlkenBailey A Usman Millett M.S., OTR/L Pager: [phone]  07/30/2016, 9:30 AM

## 2016-07-31 LAB — BASIC METABOLIC PANEL
Anion gap: 10 (ref 5–15)
BUN: 10 mg/dL (ref 6–20)
CHLORIDE: 106 mmol/L (ref 101–111)
CO2: 25 mmol/L (ref 22–32)
Calcium: 9 mg/dL (ref 8.9–10.3)
Creatinine, Ser: 0.65 mg/dL (ref 0.44–1.00)
GFR calc non Af Amer: >60 mL/min (ref >60)
Glucose, Bld: 110 mg/dL — ABNORMAL HIGH (ref 65–99)
POTASSIUM: 3.3 mmol/L — AB (ref 3.5–5.1)
SODIUM: 141 mmol/L (ref 135–145)

## 2016-07-31 LAB — CBC WITH DIFFERENTIAL/PLATELET
Basophils Absolute: 0 10*3/uL (ref 0.0–0.1)
Basophils Relative: 0 %
Eosinophils Absolute: 0.1 10*3/uL (ref 0.0–0.7)
Eosinophils Relative: 1 %
HCT: 32 % — ABNORMAL LOW (ref 36.0–46.0)
HEMOGLOBIN: 9.6 g/dL — AB (ref 12.0–15.0)
LYMPHS ABS: 1.3 10*3/uL (ref 0.7–4.0)
LYMPHS PCT: 18 %
MCH: 26.4 pg (ref 26.0–34.0)
MCHC: 30 g/dL (ref 30.0–36.0)
MCV: 88.2 fL (ref 78.0–100.0)
MONOS PCT: 9 %
Monocytes Absolute: 0.6 10*3/uL (ref 0.1–1.0)
NEUTROS PCT: 72 %
Neutro Abs: 5.1 10*3/uL (ref 1.7–7.7)
Platelets: 167 10*3/uL (ref 150–400)
RBC: 3.63 MIL/uL — AB (ref 3.87–5.11)
RDW: 14.8 % (ref 11.5–15.5)
WBC: 7.1 10*3/uL (ref 4.0–10.5)

## 2016-07-31 LAB — GLUCOSE, CAPILLARY
GLUCOSE-CAPILLARY: 105 mg/dL — AB (ref 65–99)
GLUCOSE-CAPILLARY: 110 mg/dL — AB (ref 65–99)
GLUCOSE-CAPILLARY: 115 mg/dL — AB (ref 65–99)
GLUCOSE-CAPILLARY: 116 mg/dL — AB (ref 65–99)
GLUCOSE-CAPILLARY: 99 mg/dL (ref 65–99)
GLUCOSE-CAPILLARY: 99 mg/dL (ref 65–99)

## 2016-07-31 LAB — TRIGLYCERIDES: Triglycerides: 69 mg/dL (ref <150)

## 2016-07-31 MED ORDER — BUTALBITAL-APAP-CAFFEINE 50-325-40 MG PO TABS
1.0000 | ORAL_TABLET | Freq: Four times a day (QID) | ORAL | Status: DC | PRN
  Administered 2016-08-02 – 2016-08-03 (×2): 2 via ORAL
  Filled 2016-07-31 (×2): qty 2
  Filled 2016-07-31: qty 1

## 2016-07-31 NOTE — Progress Notes (Signed)
STROKE TEAM PROGRESS NOTE   HISTORY OF PRESENT ILLNESS (per record) Diana Bush is an 75 y.o. female with a history of hypertension brought to the emergency room and code stroke status following acute onset of speech, right gaze and left hemiplegia. Symptoms occurred at 8:00 PM tonight 07/28/2016 (LKW). She has no previous history of stroke nor TIA. CT scan of her head showed a small area of early infarction involving left MCA territory, as well as acute left MCA M1 thrombus. Patient was deemed a candidate for TPA which was administered IV. No significant change and clinical deficits was seen. Interventional radiology was consulted for further management. Blood pressure was elevated on initial presentation which required IV hydralazine for immediate management. NIH stroke score was 21. Patient was taken to IR where she received received near complete revascularization of occluded superior right MCA with Solitaire and intra-arterial Integrilin with TICI 2B reperfusion. She was admitted to the neuro ICU for further evaluation and treatment.   SUBJECTIVE (INTERVAL HISTORY) Patient was extubated yesterday, has left sided hemiparesis and neglect Follow some commands like giving thumbs up   OBJECTIVE Temp:  [98.4 F (36.9 C)-100.3 F (37.9 C)] 98.4 F (36.9 C) (09/23 0400) Pulse Rate:  [32-158] 99 (09/23 0700) Cardiac Rhythm: Atrial fibrillation (09/22 1930) Resp:  [11-34] 18 (09/23 0700) BP: (101-161)/(56-124) 127/75 (09/23 0700) SpO2:  [91 %-100 %] 94 % (09/23 0700) FiO2 (%):  [40 %] 40 % (09/22 0819) Weight:  [83.7 kg (184 lb 8.4 oz)] 83.7 kg (184 lb 8.4 oz) (09/23 0340)  CBC:   Recent Labs Lab 07/30/16 0736 07/31/16 0149  WBC 6.5 7.1  NEUTROABS 4.4 5.1  HGB 10.2* 9.6*  HCT 34.4* 32.0*  MCV 89.1 88.2  PLT 151 167    Basic Metabolic Panel:   Recent Labs Lab 07/30/16 0736 07/30/16 1701 07/31/16 0149  NA 141  --  141  K 3.6  --  3.3*  CL 109  --  106  CO2 25  --  25   GLUCOSE 92  --  110*  BUN 19  --  10  CREATININE 0.75  --  0.65  CALCIUM 8.4*  --  9.0  MG 2.0 1.9  --   PHOS 3.0 2.4*  --     Lipid Panel:     Component Value Date/Time   CHOL 143 07/29/2016 0500   TRIG 69 07/31/2016 0149   HDL 43 07/29/2016 0500   CHOLHDL 3.3 07/29/2016 0500   VLDL 10 07/29/2016 0500   LDLCALC 90 07/29/2016 0500   HgbA1c:  Lab Results  Component Value Date   HGBA1C 5.6 07/29/2016   Urine Drug Screen:     Component Value Date/Time   LABOPIA NONE DETECTED 07/28/2016 2156   COCAINSCRNUR NONE DETECTED 07/28/2016 2156   LABBENZ NONE DETECTED 07/28/2016 2156   AMPHETMU NONE DETECTED 07/28/2016 2156   THCU NONE DETECTED 07/28/2016 2156   LABBARB NONE DETECTED 07/28/2016 2156      IMAGING  MRI of the brain 07/29/2016: Significant progression of acute infarction compared with the preoperative code stroke CT, now with involvement of the insula,posterior frontal and anterior parietal cortex and regional white matter. No visible hemorrhagic transformation.  Ct Head Code Stroke W/o Cm 07/28/2016 1. Acute RIGHT MCA territory nonhemorrhagic infarct with dense RIGHT MCA. Otherwise negative CT HEAD for age. 2. ASPECTS is 8.   Cerebral angiogram 07/28/2016  S/P RT common carotid arteriogram,followed by near complete revascularization of occlded superior division of RT MCA with  x 1pass with solitaire 4 mm x 40 mm FR retrieval device and 1.5 mg of superselective  Intraarterial intracranial INTEGRELIN achieving a TICI 2b reperfusion.  Ct Head Code Stroke Wo Contrast post IR 07/29/2016 1. No complication identified status post recent catheter directed therapy for right MCA embolism. 2. Evolving hypodensity within the right insular cortex, compatible with evolving ischemia.    PHYSICAL EXAM Cardiac exam irregular rate  no murmur or gallop. Lungs are clear to auscultation. Distal pulses are well felt. Neurological Exam :  Patient looks alert Left-sided neglect, left  facial droop Speech is severely slurred, but preserved some comprehension, give a thumbs up on Weiss command moves the right side. Motor exam Left upper extremity paralysis, withdraws to painful stimuli in left lower extremity with Babinski sign positive. Right-sided moves appropriately.  ASSESSMENT/PLAN [name] is a 75 y.o. female with history of HTN presenting with right gaze and left hemiplegia. She received IV t-PA 07/28/2016 at 2106. Taken to intervention, where she received near complete revascularization of occluded superior right MCA with Solitaire and intra-arterial Integrilin with TICI 2B revascularization. Now extubated  Stroke:  Non-dominant right MCA infarct s/p IV tPA and TICI2b revascularization of R MCA thrombus with Solitaire and IA Integridin, infarct embolic secondary to new onset atrial fibrillation   Resultant  left hemiplegia, left neglect   MRI  progression of acute infarction now with involvement of the insula,posterior frontal and anterior parietal cortex and regional white matter. No visible hemorrhagic transformation.    2D Echo 60-65% ejection fraction  LDL 90  HgbA1c 5.6  SCDs for VTE prophylaxis Diet NPO time specified  No antithrombotic prior to admission, now On Aspirin.  Ongoing aggressive stroke risk factor management  Therapy recommendations:  pending        Atrial Fibrillation/Flutter, new onset  New onset with heart rates elevated, confirmed with EKG   Home anticoagulation:  None  CHA2DS2-VASc Score = 6, ?2 oral anticoagulation recommended  Age in Years:  ?[age]   +2    Sex:  Female   Female   +1    Hypertension History:  yes   +1     Diabetes Mellitus:  0   Congestive Heart Failure History:  0  Vascular Disease History:  0   Stroke/TIA/Thromboembolism History:  yes   +2  CCM treated heart rate 130s to 140s with metoprolol with good results   Cardiology consult requested   Recommend anticoagulation may need to hold for  1-2 weeks given size of stroke  Hypertensive Emergency  Blood pressure now stable  Treated with IV hydralazine and Cardene  Stable on drip, upper 90s to 130s  Aggressive control post IR by neurointerventionalist  Hyperlipidemia  Home meds:  No statin  LDL 90, goal < 70  Add statin once able to swallow  Other Stroke Risk Factors  Obesity, Body mass index is 30.71 kg/m., recommend weight loss, diet and exercise as appropriate   Other Active Problems  Hyperkalemia, 3.2 repleted. Follow up in am.  Acute blood loss anemia status post neuro-intervention. Hemoglobin 11.1->9.3  Hypocalcemia 8.4  Hyperglycemia 103-115. HgbA1c pending          To contact Stroke Continuity provider, please refer to [email or web address]. After hours, contact General Neurology

## 2016-07-31 NOTE — Evaluation (Signed)
Clinical/Bedside Swallow Evaluation Patient Details  Name: Diana Bush MRN: [id number] Date of Birth: 11/23/1940  Today's Date: 07/31/2016 Time: SLP Start Time (ACUTE ONLY): 0830 SLP Stop Time (ACUTE ONLY): 0845 SLP Time Calculation (min) (ACUTE ONLY): 15 min  Past Medical History:  Past Medical History:  Diagnosis Date  . Hypertension   . Osteoarthritis   . PAF (paroxysmal atrial fibrillation) (HCC)    Past Surgical History:  Past Surgical History:  Procedure Laterality Date  . RADIOLOGY WITH ANESTHESIA N/A 07/28/2016   Procedure: RADIOLOGY WITH ANESTHESIA;  Surgeon: [name], MD;  Location: [facility] OR;  Service: Radiology;  Laterality: N/A;   HPI:  Diana Bush an 75 y.o.femalewith a history of hypertension brought to the emergency room and code stroke status following acute onset of slurred speech, right gaze and left hemiplegia. Symptoms occurred at 8:00 PM tonight. She has no previous history of stroke nor TIA. CT scan of her head showed a small area of early infarction involving left MCA territory, as well as acute left MCA M1 thrombus. Patient was deemed a candidate for TPA which was administered IV. No significant change in  clinical deficits was seen. Interventional radiology was consulted for further management. Blood pressure was elevated on initial presentation which required IV hydralazine for immediate management. NIH stroke score was 21.  Most recent MRI is showing significant progression of infarct with involvement in the insula, posterior frontal lob and anterior parietal lobe and regional white matter.  There has been no hemorhagic conversion.     Assessment / Plan / Recommendation Clinical Impression  Clinical swallowing evaluation was completed using ice chips, thin liquids via tsp sips and pureed material.  Oral mechanism exam was completed and decreased lingual range of motion was noted with limited movement to the left as well as reduced labial range of motion  on the left.  Decreased sensation suspected due to issues with bolus loss on the right that were not felt by the patient.  The patient's daughter reported a long standing history of reflux and sinus problems as well as significant belching with intake.  The patient presented with oral and pharyngeal dysphagia characterized by delayed oral transit, decreased bolus awareness, delayed swallow trigger and decreased hyo-laryngeal excursion.  Overt s/s of aspiration were not seen, however, given the patient's current admission and need for intubation recommend proceed with MBS to determine safest diet.  Recommend that the patient remain NPO pending the results.      Aspiration Risk  Moderate aspiration risk    Diet Recommendation   NPO pending results of MBS.    Medication Administration: Via alternative means    Other  Recommendations Oral Care Recommendations: Oral care QID Other Recommendations: Have oral suction available   Follow up Recommendations Inpatient Rehab;Skilled Nursing facility      Frequency and Duration min 2x/week  2 weeks       Prognosis Prognosis for Safe Diet Advancement: Good      Swallow Study   General Date of Onset: 07/28/16 HPI: Diana Bush an 75 y.o.femalewith a history of hypertension brought to the emergency room and code stroke status following acute onset of slurred speech, right gaze and left hemiplegia. Symptoms occurred at 8:00 PM tonight. She has no previous history of stroke nor TIA. CT scan of her head showed a small area of early infarction involving left MCA territory, as well as acute left MCA M1 thrombus. Patient was deemed a candidate for TPA which was administered IV. No significant  change in  clinical deficits was seen. Interventional radiology was consulted for further management. Blood pressure was elevated on initial presentation which required IV hydralazine for immediate management. NIH stroke score was 21.  Most recent MRI is showing  significant progression of infarct with involvement in the insula, posterior frontal lob and anterior parietal lobe and regional white matter.  There has been no hemorhagic conversion.   Type of Study: Bedside Swallow Evaluation Previous Swallow Assessment: None noted.   Diet Prior to this Study: NPO Temperature Spikes Noted: Yes Respiratory Status: Nasal cannula History of Recent Intubation: Yes Length of Intubations (days): 2 days Date extubated: 07/30/16 Behavior/Cognition: Alert;Cooperative;Requires cueing;Doesn't follow directions Oral Cavity Assessment: Within Functional Limits Oral Care Completed by SLP: Yes Oral Cavity - Dentition: Missing dentition Vision: Impaired for self-feeding Self-Feeding Abilities: Total assist Patient Positioning: Upright in bed Baseline Vocal Quality: Low vocal intensity Volitional Cough: Weak Volitional Swallow: Able to elicit    Oral/Motor/Sensory Function Overall Oral Motor/Sensory Function: Moderate impairment Facial Symmetry: Abnormal symmetry left Facial Strength: Reduced left Facial Sensation: Reduced right;Reduced left Lingual ROM: Reduced left Lingual Symmetry: Abnormal symmetry left Lingual Strength: Other (Comment) (Unable to assess.  )   Ice Chips Ice chips: Impaired Presentation: Spoon Oral Phase Impairments: Impaired mastication;Poor awareness of bolus Oral Phase Functional Implications: Prolonged oral transit Pharyngeal Phase Impairments: Suspected delayed Swallow   Thin Liquid Thin Liquid: Impaired Presentation: Spoon Oral Phase Impairments: Impaired mastication;Poor awareness of bolus Oral Phase Functional Implications: Left anterior spillage;Prolonged oral transit Pharyngeal  Phase Impairments: Suspected delayed Swallow;Decreased hyoid-laryngeal movement    Nectar Thick Nectar Thick Liquid: Not tested   Honey Thick Honey Thick Liquid: Not tested   Puree Puree: Impaired Presentation: Spoon Oral Phase Impairments: Poor  awareness of bolus;Impaired mastication Oral Phase Functional Implications: Left anterior spillage;Prolonged oral transit Pharyngeal Phase Impairments: Suspected delayed Swallow;Decreased hyoid-laryngeal movement   Solid   GO   Solid: Not tested    Functional Assessment Tool Used: ASHA NOMS and clinical judgment. Functional Limitations: Motor speech Motor Speech Current Status [phone]): At least 40 percent but less than 60 percent impaired, limited or restricted Motor Speech Goal Status [phone]): At least 40 percent but less than 60 percent impaired, limited or restricted   Dimas Aguas, MA, CCC-SLP Acute Rehab SLP [phone] Dimas Aguas N 07/31/2016,9:22 AM

## 2016-07-31 NOTE — Progress Notes (Addendum)
Patient Name: Diana Bush Date of Encounter: 07/31/2016  Active Problems:   CVA (cerebral infarction)   PAF (paroxysmal atrial fibrillation) (HCC)   Atrial fibrillation with RVR (HCC)   Length of Stay: 3  SUBJECTIVE  Successfully extubated. Left hemiplegia. Rapid rates noted yesterday Neuro note this am asks to avoid antiocoagulation for 1-2 weeks given stroke size  CURRENT MEDS . aspirin  300 mg Rectal Daily  . chlorhexidine  15 mL Mouth/Throat BID  . digoxin  0.25 mg Oral Daily  . famotidine (PEPCID) IV  20 mg Intravenous Q12H    OBJECTIVE   Intake/Output Summary (Last 24 hours) at 07/31/16 1328 Last data filed at 07/31/16 1210  Gross per 24 hour  Intake           1747.5 ml  Output                0 ml  Net           1747.5 ml   Filed Weights   07/28/16 2000 07/30/16 0430 07/31/16 0340  Weight: 185 lb 3 oz (84 kg) 186 lb 1.1 oz (84.4 kg) 184 lb 8.4 oz (83.7 kg)    PHYSICAL EXAM Vitals:   07/31/16 0900 07/31/16 1000 07/31/16 1100 07/31/16 1200  BP: (!) 150/81 137/78 133/82 130/78  Pulse: (!) 126 82 98 78  Resp: 17 18 19 16   Temp:    99.6 F (37.6 C)  TempSrc:    Oral  SpO2: 97% 97% 96% 96%  Weight:      Height:       General: Alert,  , no distress  irregular rate and rhythm    LABS  CBC  Recent Labs  07/30/16 0736 07/31/16 0149  WBC 6.5 7.1  NEUTROABS 4.4 5.1  HGB 10.2* 9.6*  HCT 34.4* 32.0*  MCV 89.1 88.2  PLT 151 167   Basic Metabolic Panel  Recent Labs  07/30/16 0736 07/30/16 1701 07/31/16 0149  NA 141  --  141  K 3.6  --  3.3*  CL 109  --  106  CO2 25  --  25  GLUCOSE 92  --  110*  BUN 19  --  10  CREATININE 0.75  --  0.65  CALCIUM 8.4*  --  9.0  MG 2.0 1.9  --   PHOS 3.0 2.4*  --    Liver Function Tests  Recent Labs  07/28/16 2034  AST 25  ALT 42  ALKPHOS 68  BILITOT 0.7  PROT 7.0  ALBUMIN 3.8   Hemoglobin A1C  Recent Labs  07/29/16 0500  HGBA1C 5.6   Fasting Lipid Panel  Recent Labs  07/29/16 0500  07/31/16 0149  CHOL 143  --   HDL 43  --   LDLCALC 90  --   TRIG 50 69  CHOLHDL 3.3  --    Radiology Studies Imaging results have been reviewed and Mr Brain Wo Contrast  Result Date: 07/29/2016 CLINICAL DATA:  Continued surveillance RIGHT MCA infarction. EXAM: MRI HEAD WITHOUT CONTRAST TECHNIQUE: Multiplanar, multiecho pulse sequences of the brain and surrounding structures were obtained without intravenous contrast. COMPARISON:  Multiple priors. FINDINGS: Significant motion degradation. Brain: Large area of acute infarction within the distribution of the RIGHT MCA territory involves the insula, posterior frontal lobe, and parietal lobe, largely sparing the temporal lobe and anterior frontal lobe. Within limits for visualization on motion degraded GRE sequence, no RIGHT hemisphere parenchymal hemorrhage. Tiny focus of chronic hemorrhage medial anterior thalamus, periventricular  location, uncertain significance. Vascular: Flow voids are maintained in the internal carotid arteries, basilar artery, and LEFT greater than RIGHT vertebral arteries. Flow void has been re-established in the M1 and M2 segments of the RIGHT middle cerebral artery. Skull and upper cervical spine: Normal marrow signal. Sinuses/Orbits: Negative. Other: None. IMPRESSION: Significant progression of acute infarction compared with the preoperative code stroke CT, now with involvement of the insula, posterior frontal and anterior parietal cortex and regional white matter. No visible hemorrhagic transformation. Electronically Signed   By: Elsie StainJohn T Curnes M.D.   On: 07/29/2016 20:08    TELE AFib, ventricular rate around 100  ECHO Normla LV function with mod LVH and n ormal LA size  Swallowing recs reviewed-- stay NPO  ASSESSMENT AND PLAN  1. AFib:  Persistent  rate control borderline adequate.  Continue current meds 2. Embolic CVA with left hemiplegia 3. HTN  Better  Will let neuro determine BP target but for now 150's prob  reasonable

## 2016-07-31 NOTE — Evaluation (Signed)
Speech Language Pathology Evaluation Patient Details Name: Diana Bush MRN: [id number] DOB: 11/23/1940 Today's Date: 07/31/2016 Time: [id number] SLP Time Calculation (min) (ACUTE ONLY): 35 min  Problem List:  Patient Active Problem List   Diagnosis Date Noted  . Atrial fibrillation with RVR (HCC) 07/29/2016  . PAF (paroxysmal atrial fibrillation) (HCC)   . CVA (cerebral infarction) 07/28/2016   Past Medical History:  Past Medical History:  Diagnosis Date  . Hypertension   . Osteoarthritis   . PAF (paroxysmal atrial fibrillation) (HCC)    Past Surgical History:  Past Surgical History:  Procedure Laterality Date  . RADIOLOGY WITH ANESTHESIA N/A 07/28/2016   Procedure: RADIOLOGY WITH ANESTHESIA;  Surgeon: [name], MD;  Location: [facility] OR;  Service: Radiology;  Laterality: N/A;   HPI:  Diana Bush an 75 y.o.femalewith a history of hypertension brought to the emergency room and code stroke status following acute onset of slurred speech, right gaze and left hemiplegia. Symptoms occurred at 8:00 PM tonight. She has no previous history of stroke nor TIA. CT scan of her head showed a small area of early infarction involving left MCA territory, as well as acute left MCA M1 thrombus. Patient was deemed a candidate for TPA which was administered IV. No significant change in  clinical deficits was seen. Interventional radiology was consulted for further management. Blood pressure was elevated on initial presentation which required IV hydralazine for immediate management. NIH stroke score was 21.  Most recent MRI is showing significant progression of infarct with involvement in the insula, posterior frontal lob and anterior parietal lobe and regional white matter.  There has been no hemorhagic conversion.     Assessment / Plan / Recommendation Clinical Impression  Language and motor speech evaluation were completed.  The patient demonstrated possible receptive and expressive language  deficits as well as significant dysarthria.  Verbal output from the patient is limited and she was requesting pen and paper to write.  In addition, there is report of possible visual changes that may have impacted her ability to complete some of the requested tasks.  She struggled to consistently answer simple biographical yes/no questions even given pen/paper and to identify objects in a field of 2.  She was able to follow simple one step directions given minimal cues and appeared to be able to name objects (i.e. difficult to discern due to significant dysarthria).  The patient attempted to count 1-10 and required max visual and verbal cues to complete.  The patient was able to answer responsive naming questions when she was able to write her response.    The patient will need intense ST follow up to address dysarthria and possible language deficits.  ST will follow at this level of care to initiate therapy.      SLP Assessment  Patient needs continued Speech Lanaguage Pathology Services    Follow Up Recommendations  Inpatient Rehab vs Skilled Nursing facility    Frequency and Duration min 2x/week  2 weeks      SLP Evaluation Cognition  Orientation Level: Other (comment) (mute)       Comprehension  Auditory Comprehension Overall Auditory Comprehension: Impaired Yes/No Questions: Impaired Basic Biographical Questions: 51-75% accurate Commands: Impaired One Step Basic Commands: 75-100% accurate Conversation: Other (comment) (Limited verbal output.  ) Reading Comprehension Reading Status: Not tested    Expression Expression Primary Mode of Expression: Nonverbal - gestures Verbal Expression Overall Verbal Expression: Impaired Initiation: Impaired Automatic Speech:  (Limited verbal output.  ) Level of Generative/Spontaneous Verbalization:  (  Limited verbal output.  ) Repetition: Impaired Level of Impairment: Word level Naming: No impairment (Pt appeared to be able to name objects.   ) Pragmatics: No impairment Written Expression Dominant Hand: Right Written Expression: Within Functional Limits (at the word level - needs further assessment.  )   Oral / Motor  Oral Motor/Sensory Function Overall Oral Motor/Sensory Function: Moderate impairment Facial ROM: Reduced left Facial Symmetry: Abnormal symmetry left Facial Strength: Reduced left Facial Sensation: Reduced right;Reduced left Lingual ROM: Reduced left Lingual Symmetry: Abnormal symmetry left Lingual Strength: Other (Comment) (Unable to assess.  ) Motor Speech Overall Motor Speech: Impaired Respiration: Within functional limits Phonation: Low vocal intensity Resonance: Within functional limits Articulation: Impaired Level of Impairment: Word Intelligibility: Intelligibility reduced Word: 25-49% accurate Phrase: Not tested Sentence: Not tested Conversation: Not tested Motor Planning: Witnin functional limits (Does not appear to be impaired.  ) Motor Speech Errors: Not applicable   GO          Functional Assessment Tool Used: ASHA NOMS and clinical judgment. Functional Limitations: Motor speech Motor Speech Current Status [phone]): At least 40 percent but less than 60 percent impaired, limited or restricted Motor Speech Goal Status [phone]): At least 40 percent but less than 60 percent impaired, limited or restricted         Dimas Aguas, MA, CCC-SLP Acute Rehab SLP [phone] [name] 07/31/2016, 9:34 AM

## 2016-07-31 NOTE — Progress Notes (Signed)
D/w Dr Desmond Lopeurk + RN + eye ball exam - PCCM will sign off  Dr. Kalman ShanMurali Adaliah Hiegel, M.D., [facility].C.C.P Pulmonary and Critical Care Medicine Staff Physician [facility] Pager: [phone], If no answer or between  15:00h - 7:00h: call [phone]  07/31/2016 12:48 PM

## 2016-08-01 ENCOUNTER — Inpatient Hospital Stay (HOSPITAL_COMMUNITY): Payer: Medicare Other

## 2016-08-01 LAB — CBC WITH DIFFERENTIAL/PLATELET
BASOS ABS: 0 10*3/uL (ref 0.0–0.1)
BASOS PCT: 0 %
EOS ABS: 0.1 10*3/uL (ref 0.0–0.7)
EOS PCT: 1 %
HCT: 32.1 % — ABNORMAL LOW (ref 36.0–46.0)
Hemoglobin: 9.9 g/dL — ABNORMAL LOW (ref 12.0–15.0)
LYMPHS ABS: 1.2 10*3/uL (ref 0.7–4.0)
Lymphocytes Relative: 20 %
MCH: 26.6 pg (ref 26.0–34.0)
MCHC: 30.8 g/dL (ref 30.0–36.0)
MCV: 86.3 fL (ref 78.0–100.0)
Monocytes Absolute: 0.6 10*3/uL (ref 0.1–1.0)
Monocytes Relative: 10 %
Neutro Abs: 4.3 10*3/uL (ref 1.7–7.7)
Neutrophils Relative %: 69 %
PLATELETS: 168 10*3/uL (ref 150–400)
RBC: 3.72 MIL/uL — AB (ref 3.87–5.11)
RDW: 14.5 % (ref 11.5–15.5)
WBC: 6.2 10*3/uL (ref 4.0–10.5)

## 2016-08-01 LAB — GLUCOSE, CAPILLARY
GLUCOSE-CAPILLARY: 100 mg/dL — AB (ref 65–99)
GLUCOSE-CAPILLARY: 87 mg/dL (ref 65–99)
GLUCOSE-CAPILLARY: 101 mg/dL — AB (ref 65–99)
Glucose-Capillary: 102 mg/dL — ABNORMAL HIGH (ref 65–99)
Glucose-Capillary: 116 mg/dL — ABNORMAL HIGH (ref 65–99)

## 2016-08-01 LAB — BASIC METABOLIC PANEL
Anion gap: 8 (ref 5–15)
BUN: 7 mg/dL (ref 6–20)
CALCIUM: 9.1 mg/dL (ref 8.9–10.3)
CO2: 25 mmol/L (ref 22–32)
CREATININE: 0.61 mg/dL (ref 0.44–1.00)
Chloride: 106 mmol/L (ref 101–111)
GFR calc Af Amer: >60 mL/min (ref >60)
Glucose, Bld: 112 mg/dL — ABNORMAL HIGH (ref 65–99)
Potassium: 3.3 mmol/L — ABNORMAL LOW (ref 3.5–5.1)
SODIUM: 139 mmol/L (ref 135–145)

## 2016-08-01 MED ORDER — DILTIAZEM HCL 100 MG IV SOLR: 5.0000 mg/h | INTRAVENOUS | Status: DC

## 2016-08-01 MED ORDER — KETOROLAC TROMETHAMINE 15 MG/ML IJ SOLN
15.0000 mg | Freq: Three times a day (TID) | INTRAMUSCULAR | Status: DC | PRN
  Administered 2016-08-01: 15 mg via INTRAVENOUS
  Filled 2016-08-01: qty 1

## 2016-08-01 MED ORDER — POTASSIUM CHLORIDE CRYS ER 20 MEQ PO TBCR
20.0000 meq | EXTENDED_RELEASE_TABLET | Freq: Once | ORAL | Status: AC
  Administered 2016-08-01: 20 meq via ORAL
  Filled 2016-08-01: qty 1

## 2016-08-01 MED ORDER — POTASSIUM CHLORIDE 10 MEQ/100ML IV SOLN
10.0000 meq | INTRAVENOUS | Status: AC
  Administered 2016-08-01 (×3): 10 meq via INTRAVENOUS
  Filled 2016-08-01 (×4): qty 100

## 2016-08-01 MED ORDER — ASPIRIN EC 325 MG PO TBEC
325.0000 mg | DELAYED_RELEASE_TABLET | Freq: Every day | ORAL | Status: DC
  Administered 2016-08-02 – 2016-08-04 (×3): 325 mg via ORAL
  Filled 2016-08-01 (×3): qty 1

## 2016-08-01 MED ORDER — DILTIAZEM HCL-DEXTROSE 100-5 MG/100ML-% IV SOLN (PREMIX)
5.0000 mg/h | INTRAVENOUS | Status: DC
  Filled 2016-08-01: qty 100

## 2016-08-01 NOTE — Progress Notes (Signed)
MBSS complete. Full report located under chart review in imaging section.  Kordelia Severin MA, CCC-SLP [phone]   

## 2016-08-01 NOTE — Progress Notes (Signed)
STROKE TEAM PROGRESS NOTE   HISTORY OF PRESENT ILLNESS (per record) Diana Bush is an 75 y.o. female with a history of hypertension brought to the emergency room and code stroke status following acute onset of speech, right gaze and left hemiplegia. Symptoms occurred at 8:00 PM tonight 07/28/2016 ([facility]). She has no previous history of stroke nor TIA. CT scan of her head showed a small area of early infarction involving left MCA territory, as well as acute left MCA M1 thrombus. Patient was deemed a candidate for TPA which was administered IV. No significant change and clinical deficits was seen. Interventional radiology was consulted for further management. Blood pressure was elevated on initial presentation which required IV hydralazine for immediate management. NIH stroke score was 21. Patient was taken to IR where she received received near complete revascularization of occluded superior right MCA with Solitaire and intra-arterial Integrilin with TICI 2B reperfusion. She was admitted to the neuro ICU for further evaluation and treatment.   SUBJECTIVE (INTERVAL HISTORY) Patient was extubated yesterday, has left sided hemiparesis and neglect, complaining of headache Follow some commands like giving thumbs up   OBJECTIVE Temp:  [97.8 F (36.6 C)-99.6 F (37.6 C)] 97.8 F (36.6 C) (09/24 0811) Pulse Rate:  [65-136] 65 (09/24 0600) Cardiac Rhythm: Atrial fibrillation (09/24 0600) Resp:  [13-31] 13 (09/24 0600) BP: (122-168)/(69-115) 163/81 (09/24 0600) SpO2:  [95 %-100 %] 99 % (09/24 0600) Weight:  [81.5 kg (179 lb 10.8 oz)] 81.5 kg (179 lb 10.8 oz) (09/24 0500)  CBC:   Recent Labs Lab 07/31/16 0149 08/01/16 0240  WBC 7.1 6.2  NEUTROABS 5.1 4.3  HGB 9.6* 9.9*  HCT 32.0* 32.1*  MCV 88.2 86.3  PLT 167 168    Basic Metabolic Panel:   Recent Labs Lab 07/30/16 0736 07/30/16 1701 07/31/16 0149 08/01/16 0240  NA 141  --  141 139  K 3.6  --  3.3* 3.3*  CL 109  --  106 106  CO2 25   --  25 25  GLUCOSE 92  --  110* 112*  BUN 19  --  10 7  CREATININE 0.75  --  0.65 0.61  CALCIUM 8.4*  --  9.0 9.1  MG 2.0 1.9  --   --   PHOS 3.0 2.4*  --   --     Lipid Panel:     Component Value Date/Time   CHOL 143 07/29/2016 0500   TRIG 69 07/31/2016 0149   HDL 43 07/29/2016 0500   CHOLHDL 3.3 07/29/2016 0500   VLDL 10 07/29/2016 0500   LDLCALC 90 07/29/2016 0500   HgbA1c:  Lab Results  Component Value Date   HGBA1C 5.6 07/29/2016   Urine Drug Screen:     Component Value Date/Time   LABOPIA NONE DETECTED 07/28/2016 2156   COCAINSCRNUR NONE DETECTED 07/28/2016 2156   LABBENZ NONE DETECTED 07/28/2016 2156   AMPHETMU NONE DETECTED 07/28/2016 2156   THCU NONE DETECTED 07/28/2016 2156   LABBARB NONE DETECTED 07/28/2016 2156      IMAGING  MRI of the brain  07/29/2016 Significant progression of acute infarction compared with the preoperative code stroke CT, now with involvement of the insula,posterior frontal and anterior parietal cortex and regional white matter. No visible hemorrhagic transformation.   Ct Head Code Stroke W/o Cm 07/28/2016 1. Acute RIGHT MCA territory nonhemorrhagic infarct with dense RIGHT MCA. Otherwise negative CT HEAD for age.  2. ASPECTS is 8.    Cerebral angiogram 07/28/2016  S/P RT common  carotid arteriogram,followed by near complete revascularization of occlded superior division of RT MCA with x 1pass with solitaire 4 mm x 40 mm FR retrieval device and 1.5 mg of superselective  Intraarterial intracranial INTEGRELIN achieving a TICI 2b reperfusion.   Ct Head Code Stroke Wo Contrast post IR 07/29/2016 1. No complication identified status post recent catheter directed therapy for right MCA embolism.  2. Evolving hypodensity within the right insular cortex, compatible with evolving ischemia.     PHYSICAL EXAM Cardiac exam irregular rate  no murmur or gallop. Lungs are clear to auscultation. Distal pulses are well felt. Neurological Exam  :  Patient looks alert than yesterday Left-sided neglect, left facial droop Speech is severely slurred, but preserved some comprehension, give a thumbs up on moves the right side. Motor exam Left upper extremity paralysis, withdraws to painful stimuli in left lower extremity with Babinski sign positive. Right-sided moves appropriately.     ASSESSMENT/PLAN [name] is a 75 y.o. female with history of HTN presenting with right gaze and left hemiplegia. She received IV t-PA 07/28/2016 at 2106. Taken to intervention, where she received near complete revascularization of occluded superior right MCA with Solitaire and intra-arterial Integrilin with TICI 2B revascularization. Now extubated  Stroke:  Non-dominant right MCA infarct s/p IV tPA and TICI2b revascularization of R MCA thrombus with Solitaire and IA Integridin, infarct embolic secondary to new onset atrial fibrillation   Resultant  left hemiplegia, left neglect   MRI  progression of acute infarction now with involvement of the insula,posterior frontal and anterior parietal cortex and regional white matter. No visible hemorrhagic transformation.  2D Echo 60-65% ejection fraction  LDL 90  HgbA1c 5.6  SCDs for VTE prophylaxis Diet NPO time specified  No antithrombotic prior to admission, now on aspirin suppository 300 mg daily.  Ongoing aggressive stroke risk factor management  Therapy recommendations:  Inpatient rehabilitation recommended - CIR  Disposition - pending     Atrial Fibrillation/Flutter, new onset  New onset with heart rates elevated, confirmed with EKG   Home anticoagulation:  None  CHA2DS2-VASc Score = 6, ?2 oral anticoagulation recommended  Age in Years:  ?[age]   +2    Sex:  Female   Female   +1    Hypertension History:  yes   +1     Diabetes Mellitus:  0   Congestive Heart Failure History:  0  Vascular Disease History:  0   Stroke/TIA/Thromboembolism History:  yes   +2  CCM treated heart rate  130s to 140s with metoprolol with good results   Cardiology - following  Recommend anticoagulation may need to hold for 1-2 weeks given size of stroke   Hypertensive Emergency  Blood pressure now stable  Treated with IV hydralazine and Cardene  Stable on drip, upper 90s to 130s  Aggressive control post IR by neurointerventionalist  Cardizem drip and when necessary IV metoprolol - for rate control   Hyperlipidemia  Home meds:  No statin  LDL 90, goal < 70  Add statin once able to swallow     Other Stroke Risk Factors  Obesity, Body mass index is 29.9 kg/m., recommend weight loss, diet and exercise as appropriate    Other Active Problems  Hypokalemia, 3.2 repleted -> 3.3 on 08/01/2016 -> replete further  Acute blood loss anemia status post neuro-intervention. Hemoglobin 11.1->9.3 -> 9.9  Hypocalcemia 8.4  Hyperglycemia 103-115. HgbA1c 5.6 NPO - speech to perform modified barium swallow. today  PLAN  Supplement potassium  Recheck labs Monday a.m.  Consider feeding tube for PO access and nutrition.  Add statin when access is available  Consider anticoagulation in 1-2 weeks based on stroke size.  Appreciate critical care and cardiology assistance.      To contact Stroke Continuity provider, please refer to [email or web address]. After hours, contact General Neurology

## 2016-08-02 ENCOUNTER — Inpatient Hospital Stay (HOSPITAL_COMMUNITY): Payer: Medicare Other

## 2016-08-02 ENCOUNTER — Encounter (HOSPITAL_COMMUNITY): Payer: Self-pay | Admitting: Interventional Radiology

## 2016-08-02 DIAGNOSIS — D62 Acute posthemorrhagic anemia: Secondary | ICD-10-CM

## 2016-08-02 DIAGNOSIS — I69391 Dysphagia following cerebral infarction: Secondary | ICD-10-CM

## 2016-08-02 DIAGNOSIS — I63311 Cerebral infarction due to thrombosis of right middle cerebral artery: Secondary | ICD-10-CM

## 2016-08-02 DIAGNOSIS — G8194 Hemiplegia, unspecified affecting left nondominant side: Secondary | ICD-10-CM

## 2016-08-02 DIAGNOSIS — I10 Essential (primary) hypertension: Secondary | ICD-10-CM | POA: Diagnosis present

## 2016-08-02 DIAGNOSIS — I639 Cerebral infarction, unspecified: Secondary | ICD-10-CM

## 2016-08-02 DIAGNOSIS — R4701 Aphasia: Secondary | ICD-10-CM

## 2016-08-02 DIAGNOSIS — E785 Hyperlipidemia, unspecified: Secondary | ICD-10-CM | POA: Diagnosis present

## 2016-08-02 DIAGNOSIS — I69322 Dysarthria following cerebral infarction: Secondary | ICD-10-CM

## 2016-08-02 LAB — GLUCOSE, CAPILLARY
GLUCOSE-CAPILLARY: 77 mg/dL (ref 65–99)
GLUCOSE-CAPILLARY: 98 mg/dL (ref 65–99)
Glucose-Capillary: 102 mg/dL — ABNORMAL HIGH (ref 65–99)
Glucose-Capillary: 117 mg/dL — ABNORMAL HIGH (ref 65–99)
Glucose-Capillary: 85 mg/dL (ref 65–99)
Glucose-Capillary: 94 mg/dL (ref 65–99)

## 2016-08-02 LAB — CBC WITH DIFFERENTIAL/PLATELET
Basophils Absolute: 0 10*3/uL (ref 0.0–0.1)
Basophils Relative: 1 %
EOS PCT: 2 %
Eosinophils Absolute: 0.1 10*3/uL (ref 0.0–0.7)
HCT: 29.8 % — ABNORMAL LOW (ref 36.0–46.0)
Hemoglobin: 9.1 g/dL — ABNORMAL LOW (ref 12.0–15.0)
LYMPHS ABS: 1.4 10*3/uL (ref 0.7–4.0)
LYMPHS PCT: 25 %
MCH: 26.3 pg (ref 26.0–34.0)
MCHC: 30.5 g/dL (ref 30.0–36.0)
MCV: 86.1 fL (ref 78.0–100.0)
MONO ABS: 0.6 10*3/uL (ref 0.1–1.0)
Monocytes Relative: 11 %
Neutro Abs: 3.4 10*3/uL (ref 1.7–7.7)
Neutrophils Relative %: 61 %
PLATELETS: 169 10*3/uL (ref 150–400)
RBC: 3.46 MIL/uL — ABNORMAL LOW (ref 3.87–5.11)
RDW: 14.4 % (ref 11.5–15.5)
WBC: 5.6 10*3/uL (ref 4.0–10.5)

## 2016-08-02 LAB — BASIC METABOLIC PANEL
Anion gap: 7 (ref 5–15)
BUN: 8 mg/dL (ref 6–20)
CALCIUM: 9 mg/dL (ref 8.9–10.3)
CO2: 24 mmol/L (ref 22–32)
CREATININE: 0.55 mg/dL (ref 0.44–1.00)
Chloride: 105 mmol/L (ref 101–111)
GFR calc Af Amer: >60 mL/min (ref >60)
GLUCOSE: 93 mg/dL (ref 65–99)
Potassium: 3.7 mmol/L (ref 3.5–5.1)
Sodium: 136 mmol/L (ref 135–145)

## 2016-08-02 MED ORDER — METOPROLOL TARTRATE 25 MG PO TABS
25.0000 mg | ORAL_TABLET | Freq: Two times a day (BID) | ORAL | Status: DC
  Administered 2016-08-02 – 2016-08-03 (×3): 25 mg via ORAL
  Filled 2016-08-02 (×4): qty 1

## 2016-08-02 MED ORDER — PANTOPRAZOLE SODIUM 40 MG PO TBEC
40.0000 mg | DELAYED_RELEASE_TABLET | Freq: Every day | ORAL | Status: DC
  Administered 2016-08-02 – 2016-08-04 (×3): 40 mg via ORAL
  Filled 2016-08-02 (×3): qty 1

## 2016-08-02 MED ORDER — ENSURE ENLIVE PO LIQD: 237.0000 mL | Freq: Two times a day (BID) | ORAL | Status: DC

## 2016-08-02 MED ORDER — ATORVASTATIN CALCIUM 20 MG PO TABS
20.0000 mg | ORAL_TABLET | Freq: Every day | ORAL | Status: DC
  Administered 2016-08-02 – 2016-08-04 (×3): 20 mg via ORAL
  Filled 2016-08-02: qty 2
  Filled 2016-08-02 (×2): qty 1

## 2016-08-02 MED ORDER — ENOXAPARIN SODIUM 40 MG/0.4ML ~~LOC~~ SOLN
40.0000 mg | SUBCUTANEOUS | Status: DC
  Administered 2016-08-02 – 2016-08-04 (×3): 40 mg via SUBCUTANEOUS
  Filled 2016-08-02 (×3): qty 0.4

## 2016-08-02 MED ORDER — METOPROLOL TARTRATE 25 MG PO TABS
25.0000 mg | ORAL_TABLET | Freq: Once | ORAL | Status: AC
  Administered 2016-08-02: 25 mg via ORAL

## 2016-08-02 NOTE — Progress Notes (Signed)
STROKE TEAM PROGRESS NOTE   SUBJECTIVE (INTERVAL HISTORY) Patient daughter and son are at bedside. Pt tolerating with extubation well and passed swallow too. She still has severe dysarthria and left hemiplegia. Crying when she felt upset such as her mom passed away not long ago.    OBJECTIVE Temp:  [98 F (36.7 C)-98.9 F (37.2 C)] 98.9 F (37.2 C) (09/25 0800) Pulse Rate:  [67-104] 85 (09/25 1017) Cardiac Rhythm: Normal sinus rhythm (09/25 0600) Resp:  [13-25] 18 (09/25 0630) BP: (145-201)/(80-107) 165/93 (09/25 0630) SpO2:  [93 %-100 %] 98 % (09/25 0630) Weight:  [180 lb 12.4 oz (82 kg)] 180 lb 12.4 oz (82 kg) (09/25 0400)  CBC:   Recent Labs Lab 08/01/16 0240 08/02/16 0308  WBC 6.2 5.6  NEUTROABS 4.3 3.4  HGB 9.9* 9.1*  HCT 32.1* 29.8*  MCV 86.3 86.1  PLT 168 169    Basic Metabolic Panel:   Recent Labs Lab 07/30/16 0736 07/30/16 1701  08/01/16 0240 08/02/16 0308  NA 141  --   < > 139 136  K 3.6  --   < > 3.3* 3.7  CL 109  --   < > 106 105  CO2 25  --   < > 25 24  GLUCOSE 92  --   < > 112* 93  BUN 19  --   < > 7 8  CREATININE 0.75  --   < > 0.61 0.55  CALCIUM 8.4*  --   < > 9.1 9.0  MG 2.0 1.9  --   --   --   PHOS 3.0 2.4*  --   --   --   < > = values in this interval not displayed.  Lipid Panel:     Component Value Date/Time   CHOL 143 07/29/2016 0500   TRIG 69 07/31/2016 0149   HDL 43 07/29/2016 0500   CHOLHDL 3.3 07/29/2016 0500   VLDL 10 07/29/2016 0500   LDLCALC 90 07/29/2016 0500   HgbA1c:  Lab Results  Component Value Date   HGBA1C 5.6 07/29/2016   Urine Drug Screen:     Component Value Date/Time   LABOPIA NONE DETECTED 07/28/2016 2156   COCAINSCRNUR NONE DETECTED 07/28/2016 2156   LABBENZ NONE DETECTED 07/28/2016 2156   AMPHETMU NONE DETECTED 07/28/2016 2156   THCU NONE DETECTED 07/28/2016 2156   LABBARB NONE DETECTED 07/28/2016 2156      IMAGING I have personally reviewed the radiological images below and agree with the  radiology interpretations.  MRI of the brain  07/29/2016 Significant progression of acute infarction compared with the preoperative code stroke CT, now with involvement of the insula,posterior frontal and anterior parietal cortex and regional white matter. No visible hemorrhagic transformation.  Ct Head Code Stroke W/o Cm 07/28/2016 1. Acute RIGHT MCA territory nonhemorrhagic infarct with dense RIGHT MCA. Otherwise negative CT HEAD for age.  2. ASPECTS is 8.   Cerebral angiogram 07/28/2016  S/P RT common carotid arteriogram,followed by near complete revascularization of occlded superior division of RT MCA with x 1pass with solitaire 4 mm x 40 mm FR retrieval device and 1.5 mg of superselective  Intraarterial intracranial INTEGRELIN achieving a TICI 2b reperfusion.  Ct Head post IR 07/29/2016 1. No complication identified status post recent catheter directed therapy for right MCA embolism.  2. Evolving hypodensity within the right insular cortex, compatible with evolving ischemia.   TTE - - Left ventricle: The cavity size was normal. There was moderate   concentric hypertrophy.  Systolic function was vigorous. The   estimated ejection fraction was in the range of 65% to 70%. Wall   motion was normal; there were no regional wall motion   abnormalities. The study was not technically sufficient to allow   evaluation of LV diastolic dysfunction due to atrial   fibrillation. - Aortic valve: Trileaflet; mildly thickened, mildly calcified   leaflets. There was mild regurgitation. - Aortic root: The aortic root was normal in size. - Mitral valve: Calcified annulus. Mildly thickened leaflets .   There was no regurgitation. - Left atrium: The atrium was normal in size. - Right ventricle: Systolic function was normal. - Right atrium: The atrium was mildly dilated. - Tricuspid valve: There was moderate regurgitation. - Pulmonic valve: There was mild regurgitation. - Pulmonary arteries: The main  pulmonary artery was normal-sized.   Systolic pressure was moderately increased. PA peak pressure: 48   mm Hg (S). - Inferior vena cava: The vessel was dilated. The respirophasic   diameter changes were blunted (< 50%), consistent with elevated   central venous pressure. - Pericardium, extracardiac: There was no pericardial effusion.  CUS pending   PHYSICAL EXAM  Temp:  [98 F (36.7 C)-98.9 F (37.2 C)] 98.9 F (37.2 C) (09/25 0800) Pulse Rate:  [67-104] 85 (09/25 1017) Resp:  [13-25] 18 (09/25 0630) BP: (145-201)/(80-107) 165/93 (09/25 0630) SpO2:  [93 %-100 %] 98 % (09/25 0630) Weight:  [180 lb 12.4 oz (82 kg)] 180 lb 12.4 oz (82 kg) (09/25 0400)  General - Well nourished, well developed, in no apparent distress.  Ophthalmologic - Fundi not visualized due to noncooperation.  Cardiovascular - irregular heart rhythm, in and out of afib rhythm.  Mental Status -  Level of arousal and orientation to time, place, and person were intact. Language showed able to have short sentences of spontaneous speech, able to follow all commands, name 2/2 and able to repeat, however, pt has severe dysarthria and left neglect.  Cranial Nerves II - XII - II - Visual field showed left visual field neglect. III, IV, VI - right gaze preference V - Facial sensation intact bilaterally. VII - left facial droop. VIII - Hearing & vestibular intact bilaterally. X - Palate elevates symmetrically. XI - Chin turning & shoulder shrug intact bilaterally. XII - Tongue protrusion to the left.  Motor Strength - The patient's strength was 0/5 LUE and LLE, 5/5 RUE and 3/5 RLE due to general letheragy.  Bulk was normal and fasciculations were absent.   Motor Tone - Muscle tone was assessed at the neck and appendages and was normal.  Reflexes - The patient's reflexes were 1+ in all extremities and she had left babinski.  Sensory - Light touch, temperature/pinprick were assessed and were symmetrical.     Coordination - The patient had normal movements in the right hand with no ataxia or dysmetria.  Tremor was absent.  Gait and Station - not tested.    ASSESSMENT/PLAN Ms. Karlina Suares is a 75 y.o. female with history of HTN presenting with right gaze and left hemiplegia. She received IV t-PA 07/28/2016 at 2106. Taken to intervention, where she received near complete revascularization of occluded superior right MCA with Solitaire and intra-arterial Integrilin with TICI 2B revascularization.  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  No visible hemorrhagic transformation.  Cerebral angio - occlusion of superior division of RT MCA s/p TICI2b revascularization   2D Echo EF 60-65%  CUS pending  LDL 90  HgbA1c 5.6  SCDs for VTE prophylaxis DIET - DYS 1 Room service appropriate? Yes; Fluid consistency: Thin  No antithrombotic prior to admission, now on aspirin suppository 300 mg daily. Will need anticoagulation in 7-10 days for stroke prevention  Ongoing aggressive stroke risk factor management  Therapy recommendations:  CIR  Disposition - pending  Atrial Fibrillation/Flutter, new onset  New onset with heart rates elevated, confirmed with EKG   Home anticoagulation:  None  CHA2DS2-VASc Score = 6, ?2 oral anticoagulation recommended  Age in Years:  ?79   +2    Sex:  Female   Female   +1    Hypertension History:  yes   +1     Diabetes Mellitus:  0   Congestive Heart Failure History:  0  Vascular Disease History:  0   Stroke/TIA/Thromboembolism History:  yes   +2  HR controlled  Cardiology on board, appreciate recs  start anticoagulation in 7-10 days given size of stroke  Hypertensive Emergency  BP on the high side   Treated with IV hydralazine and  Cardene  Stable on drip, now off drip  On metoprolol po  BP goal normotensive in 3-5 days  Hyperlipidemia  Home meds:  No statin  LDL 90, goal < 70  On lipitor 20  Continue statin on discharge.   Other Stroke Risk Factors  Obesity, Body mass index is 30.08 kg/m., recommend weight loss, diet and exercise as appropriate   Other Active Problems  Hypokalemia - supplement   This patient is critically ill due to right MCA infarct s/p tPA and IR mechanical thrombectomy, afib and HTN and at significant risk of neurological worsening, death form hemorrhagic transformation, recurrent stroke, heart failure, seizure. This patient's care requires constant monitoring of vital signs, hemodynamics, respiratory and cardiac monitoring, review of multiple databases, neurological assessment, discussion with family, other specialists and medical decision making of high complexity. I spent 40 minutes of neurocritical care time in the care of this patient.  Marvel Plan, MD PhD Stroke Neurology 08/02/2016 1:51 PM  To contact Stroke Continuity provider, please refer to [email or web address]. After hours, contact General Neurology

## 2016-08-02 NOTE — Consult Note (Signed)
Physical Medicine and Rehabilitation Consult Reason for Consult: Right MCA infarct Referring Physician: Dr.Xu   HPI: Diana Bush is a 75 y.o.right handed  female with history of hypertension, PAF. Per chart review patient independent prior to admission living with her spouse. She is the primary caregiver for her husband who is at a history of CVA. Presented 07/28/2016 with left-sided weakness, right side gaze and slurred speech. CT/MRI of the head showed a small area of early infarct right MCA territory as well as acute left MCA M1 thrombus. Patient was administered TPA. Underwent right common carotid arteriogram followed by near complete revascularization of occluded superior division of right MCA per interventional radiology. Patient did require intubation. Echocardiogram with ejection fraction of 70% no wall motion abnormalities. Hospital course atrial fibrillation with RVR cardiology service is consulted. Placed on intravenous Cardizem and heart rate stabilized.Marland Kitchen Dysphagia #1 thin liquid diet. Currently maintained on aspirin for CVA prophylaxis. Physical and occupational therapy evaluations completed with recommendations of physical medicine rehabilitation consult. Pt's son present today with several questions and elaboration of discharge disposition.     Review of Systems  Unable to perform ROS: Language   Past Medical History:  Diagnosis Date  . Hypertension   . Osteoarthritis   . PAF (paroxysmal atrial fibrillation) (HCC)    Past Surgical History:  Procedure Laterality Date  . RADIOLOGY WITH ANESTHESIA N/A 07/28/2016   Procedure: RADIOLOGY WITH ANESTHESIA;  Surgeon: Julieanne Cotton, MD;  Location: [facility] OR;  Service: Radiology;  Laterality: N/A;   No pertinent family history. Social History:  reports that she has never smoked. She does not have any smokeless tobacco history on file. Her alcohol and drug histories are not on file. Allergies: No Known Allergies No prescriptions  prior to admission.    Home: Home Living Family/patient expects to be discharged to:: Private residence Living Arrangements: Spouse/significant other Available Help at Discharge: Available PRN/intermittently, Family (The patient was the primary caretaker for her husband.  ) Additional Comments: per chart, pt is primary caregiver for her husband who has a stroke with paralysis.    Lives With: Spouse  Functional History: Prior Function Level of Independence: Independent Functional Status:  Mobility: Bed Mobility Overal bed mobility: +2 for physical assistance, Needs Assistance Bed Mobility: Supine to Sit, Sit to Supine Supine to sit: +2 for physical assistance, Total assist, HOB elevated Sit to supine: +2 for physical assistance, Total assist General bed mobility comments: Total assist of trunk and legs, pt did not initate movement of her right leg when asked to sit up, she does grasp with her right hand, but doesn't seem to pull up to help.  Transfers General transfer comment: Unable today      ADL: ADL Overall ADL's : Needs assistance/impaired Grooming: Maximal assistance, Sitting, Wash/dry face Grooming Details (indicate cue type and reason): Max assist for sitting balance. Pt able to perfrom hand to mouth with washcloth using RUE. Upper Body Bathing: Maximal assistance, Sitting Lower Body Bathing: Total assistance, Bed level Upper Body Dressing : Maximal assistance, Sitting Lower Body Dressing: Total assistance, Bed level General ADL Comments: Pt in PAF with HR 120-150s jumpping around throughout session. Pt's O2 sats seemed stable on [place], Pt unable to keep saliva in her mouth. BP elevated 160s/100s.   Cognition: Cognition Overall Cognitive Status: Difficult to assess Orientation Level: Oriented X4 Cognition Arousal/Alertness: Awake/alert Behavior During Therapy: WFL for tasks assessed/performed Overall Cognitive Status: Difficult to assess Area of Impairment: Following  commands, Problem solving  Following Commands: Follows one step commands inconsistently (to the best of her physcal ability, ~75% accuracy) Problem Solving: Decreased initiation, Requires verbal cues, Requires tactile cues (difficulty initiating when asked to sit up EOB) General Comments: Pt did well with thumbs up and down to answer questions pretty consistantly accurate.  Did well seated wtih one step command following, yet at times perseverating and repeating last command.   Difficult to assess due to: Impaired communication  Blood pressure (!) 145/85, pulse 67, temperature 98.3 F (36.8 C), temperature source Axillary, resp. rate 15, height 5\' 5"  (1.651 m), weight 82 kg (180 lb 12.4 oz), SpO2 97 %. Physical Exam  Vitals reviewed. Constitutional: She appears well-developed.  Obese  HENT:  Head: Normocephalic and atraumatic.  Eyes: Conjunctivae are normal.  Pupils reactive to light.   Neck: Normal range of motion. Neck supple. No thyromegaly present.  Cardiovascular: Normal rate and regular rhythm.   Respiratory: Effort normal and breath sounds normal.  GI: Soft. Bowel sounds are normal. She exhibits no distension.  Musculoskeletal: She exhibits no edema or tenderness.  Neurological: She is alert.  Right gaze preference Expressive aphasia Left neglect Left facial weakness DTRs symmetric Motor: LUE/LLE: 0/5 proximal to distal RUE: 5/5 proximal to distal RLE: ?4+/5 (?apraxia)  Skin: Skin is dry.  Psychiatric:  Unable to assess due to aphasia    Results for orders placed or performed during the hospital encounter of 07/28/16 (from the past 24 hour(s))  Glucose, capillary     Status: Abnormal   Collection Time: 08/01/16  8:10 AM  Result Value Ref Range   Glucose-Capillary 100 (H) 65 - 99 mg/dL  Glucose, capillary     Status: None   Collection Time: 08/01/16 11:52 AM  Result Value Ref Range   Glucose-Capillary 87 65 - 99 mg/dL  Glucose, capillary     Status: Abnormal    Collection Time: 08/01/16  4:13 PM  Result Value Ref Range   Glucose-Capillary 101 (H) 65 - 99 mg/dL  Glucose, capillary     Status: Abnormal   Collection Time: 08/01/16  8:10 PM  Result Value Ref Range   Glucose-Capillary 116 (H) 65 - 99 mg/dL  Glucose, capillary     Status: Abnormal   Collection Time: 08/02/16 12:35 AM  Result Value Ref Range   Glucose-Capillary 102 (H) 65 - 99 mg/dL  CBC with Differential/Platelet     Status: Abnormal   Collection Time: 08/02/16  3:08 AM  Result Value Ref Range   WBC 5.6 4.0 - 10.5 K/uL   RBC 3.46 (L) 3.87 - 5.11 MIL/uL   Hemoglobin 9.1 (L) 12.0 - 15.0 g/dL   HCT 16.1 (L) 09.6 - 04.5 %   MCV 86.1 78.0 - 100.0 fL   MCH 26.3 26.0 - 34.0 pg   MCHC 30.5 30.0 - 36.0 g/dL   RDW 40.9 81.1 - 91.4 %   Platelets 169 150 - 400 K/uL   Neutrophils Relative % 61 %   Neutro Abs 3.4 1.7 - 7.7 K/uL   Lymphocytes Relative 25 %   Lymphs Abs 1.4 0.7 - 4.0 K/uL   Monocytes Relative 11 %   Monocytes Absolute 0.6 0.1 - 1.0 K/uL   Eosinophils Relative 2 %   Eosinophils Absolute 0.1 0.0 - 0.7 K/uL   Basophils Relative 1 %   Basophils Absolute 0.0 0.0 - 0.1 K/uL  Basic metabolic panel     Status: None   Collection Time: 08/02/16  3:08 AM  Result Value Ref Range  Sodium 136 135 - 145 mmol/L   Potassium 3.7 3.5 - 5.1 mmol/L   Chloride 105 101 - 111 mmol/L   CO2 24 22 - 32 mmol/L   Glucose, Bld 93 65 - 99 mg/dL   BUN 8 6 - 20 mg/dL   Creatinine, Ser 1.61 0.44 - 1.00 mg/dL   Calcium 9.0 8.9 - 09.6 mg/dL   GFR calc non Af Amer >60 >60 mL/min   GFR calc Af Amer >60 >60 mL/min   Anion gap 7 5 - 15  Glucose, capillary     Status: None   Collection Time: 08/02/16  3:32 AM  Result Value Ref Range   Glucose-Capillary 94 65 - 99 mg/dL   Dg Swallowing Func-speech Pathology  Result Date: 08/01/2016 Objective Swallowing Evaluation: Type of Study: MBS-Modified Barium Swallow Study Patient Details Name: Mikell Camp MRN: [id number] Date of Birth: 1941/07/15 Today's Date:  08/01/2016 Time: SLP Start Time (ACUTE ONLY): 1230-SLP Stop Time (ACUTE ONLY): 1250 SLP Time Calculation (min) (ACUTE ONLY): 20 min Past Medical History: Past Medical History: Diagnosis Date . Hypertension  . Osteoarthritis  . PAF (paroxysmal atrial fibrillation) (HCC)  Past Surgical History: Past Surgical History: Procedure Laterality Date . RADIOLOGY WITH ANESTHESIA N/A 07/28/2016  Procedure: RADIOLOGY WITH ANESTHESIA;  Surgeon: Julieanne Cotton, MD;  Location: [facility] OR;  Service: Radiology;  Laterality: N/A; HPI: Brei Pociask an 75 y.o.femalewith a history of hypertension brought to the emergency room and code stroke status following acute onset of slurred speech, right gaze and left hemiplegia. CT scan of her head showed a small area of early infarction involving right MCA territory. Patient was deemed a candidate for TPA which was administered IV. No significant change in  clinical deficits was seen.  Most recent MRI is showing significant progression of infarct with involvement in the insula, posterior frontal lob and anterior parietal lobe and regional white matter.    Subjective: The patient was seen sitting upright in bed with family present.  Assessment / Plan / Recommendation CHL IP CLINICAL IMPRESSIONS 08/01/2016 Therapy Diagnosis Moderate oral phase dysphagia;Mild pharyngeal phase dysphagia Clinical Impression Patient presents with a moderate oral dysphagia marked by left sided facial and labial weakness resulting in delayed oral transit, occassional anterior spillage of liquids, and mild oral residue post swallow. Pharyngeally, patient with a delay in swallow initiation however great airway protection with only one episode of flash penetration noted. Pharyngeal strength intact.  Impact on safety and function Moderate aspiration risk   CHL IP TREATMENT RECOMMENDATION 08/01/2016 Treatment Recommendations Therapy as outlined in treatment plan below   Prognosis 08/01/2016 Prognosis for Safe Diet Advancement  Good Barriers to Reach Goals -- Barriers/Prognosis Comment -- CHL IP DIET RECOMMENDATION 08/01/2016 SLP Diet Recommendations Dysphagia 1 (Puree) solids;Thin liquid Liquid Administration via Cup;Straw Medication Administration Whole meds with puree Compensations Slow rate;Small sips/bites;Lingual sweep for clearance of pocketing Postural Changes Seated upright at 90 degrees   CHL IP OTHER RECOMMENDATIONS 08/01/2016 Recommended Consults -- Oral Care Recommendations Oral care BID Other Recommendations --   CHL IP FOLLOW UP RECOMMENDATIONS 08/01/2016 Follow up Recommendations Inpatient Rehab   CHL IP FREQUENCY AND DURATION 08/01/2016 Speech Therapy Frequency (ACUTE ONLY) min 2x/week Treatment Duration 2 weeks      CHL IP ORAL PHASE 08/01/2016 Oral Phase Impaired Oral - Pudding Teaspoon -- Oral - Pudding Cup -- Oral - Honey Teaspoon Weak lingual manipulation;Delayed oral transit;Lingual/palatal residue Oral - Honey Cup -- Oral - Nectar Teaspoon Weak lingual manipulation;Delayed oral transit;Lingual/palatal residue Oral - Nectar  Cup Weak lingual manipulation;Delayed oral transit;Lingual/palatal residue Oral - Nectar Straw -- Oral - Thin Teaspoon -- Oral - Thin Cup Weak lingual manipulation;Delayed oral transit;Lingual/palatal residue Oral - Thin Straw Weak lingual manipulation;Delayed oral transit;Lingual/palatal residue Oral - Puree Weak lingual manipulation;Delayed oral transit;Lingual/palatal residue Oral - Mech Soft Weak lingual manipulation;Delayed oral transit;Lingual/palatal residue Oral - Regular -- Oral - Multi-Consistency -- Oral - Pill -- Oral Phase - Comment --  CHL IP PHARYNGEAL PHASE 08/01/2016 Pharyngeal Phase Impaired Pharyngeal- Pudding Teaspoon -- Pharyngeal -- Pharyngeal- Pudding Cup -- Pharyngeal -- Pharyngeal- Honey Teaspoon Delayed swallow initiation-vallecula Pharyngeal -- Pharyngeal- Honey Cup -- Pharyngeal -- Pharyngeal- Nectar Teaspoon Delayed swallow initiation-vallecula Pharyngeal -- Pharyngeal-  Nectar Cup Delayed swallow initiation-vallecula Pharyngeal -- Pharyngeal- Nectar Straw -- Pharyngeal -- Pharyngeal- Thin Teaspoon -- Pharyngeal -- Pharyngeal- Thin Cup Delayed swallow initiation-vallecula Pharyngeal -- Pharyngeal- Thin Straw Delayed swallow initiation-vallecula Pharyngeal -- Pharyngeal- Puree Delayed swallow initiation-vallecula Pharyngeal -- Pharyngeal- Mechanical Soft Delayed swallow initiation-vallecula Pharyngeal -- Pharyngeal- Regular -- Pharyngeal -- Pharyngeal- Multi-consistency -- Pharyngeal -- Pharyngeal- Pill -- Pharyngeal -- Pharyngeal Comment --  CHL IP CERVICAL ESOPHAGEAL PHASE 08/01/2016 Cervical Esophageal Phase WFL Pudding Teaspoon -- Pudding Cup -- Honey Teaspoon -- Honey Cup -- Nectar Teaspoon -- Nectar Cup -- Nectar Straw -- Thin Teaspoon -- Thin Cup -- Thin Straw -- Puree -- Mechanical Soft -- Regular -- Multi-consistency -- Pill -- Cervical Esophageal Comment -- CHL IP GO 07/31/2016 Functional Assessment Tool Used ASHA NOMS and clinical judgment. Functional Limitations Motor speech Swallow Current Status [phone]) (None) Swallow Goal Status [phone]) (None) Swallow Discharge Status [phone]) (None) Motor Speech Current Status [phone]) CK Motor Speech Goal Status (G9562) CK Motor Speech Goal Status [phone]) (None) Spoken Language Comprehension Current Status [phone]) (None) Spoken Language Comprehension Goal Status (N6295) (None) Spoken Language Comprehension Discharge Status [phone]) (None) Spoken Language Expression Current Status [phone]) (None) Spoken Language Expression Goal Status [phone]) (None) Spoken Language Expression Discharge Status [phone]) (None) Attention Current Status (I3474) (None) Attention Goal Status (Q5956) (None) Attention Discharge Status [phone]) (None) Memory Current Status (E3329) (None) Memory Goal Status (J1884) (None) Memory Discharge Status (Z6606) (None) Voice Current Status (T0160) (None) Voice Goal Status (F0932) (None) Voice Discharge Status [phone]) (None)  Other Speech-Language Pathology Functional Limitation [phone]) (None) Other Speech-Language Pathology Functional Limitation Goal Status (K2706) (None) Other Speech-Language Pathology Functional Limitation Discharge Status [phone]) (None) Ferdinand Lango MA, CCC-SLP [phone] McCoy Leah Meryl 08/01/2016, 2:16 PM               Assessment/Plan: Diagnosis: Right MCA infarct Labs and images independently reviewed.  Records reviewed and summated above. Stroke: Continue secondary stroke prophylaxis and Risk Factor Modification listed below:   Antiplatelet therapy:   Blood Pressure Management:  Continue current medication with prn's with permisive HTN per primary team Statin Agent:   Left sided hemiparesis: fit for orthosis to prevent contractures (resting hand splint for day, wrist cock up splint at night, PRAFO, etc); PT/OT for mobility, ADL training  Motor recovery: Fluoxetine  1. Does the need for close, 24 hr/day medical supervision in concert with the patient's rehab needs make it unreasonable for this patient to be served in a less intensive setting? Yes  2. Co-Morbidities requiring supervision/potential complications: PAF with RVR (monitor HR with increased physical activity), post-stroke dysphagia (SLP, advance diet as tolerated), HTN (monitor and provide prns in accordance with increased physical exertion and pain), ABLA (transfuse if necessary to ensure appropriate perfusion for increased activity tolerance) 3. Due to bladder management, bowel management,  safety, skin/wound care, disease management, medication administration and patient education, does the patient require 24 hr/day rehab nursing? Yes 4. Does the patient require coordinated care of a physician, rehab nurse, PT (1-2 hrs/day, 5 days/week), OT (1-2 hrs/day, 5 days/week) and SLP (1-2 hrs/day, 5 days/week) to address physical and functional deficits in the context of the above medical diagnosis(es)? Yes Addressing deficits in the  following areas: balance, endurance, locomotion, strength, transferring, bowel/bladder control, bathing, dressing, feeding, grooming, toileting, cognition, speech, language, swallowing and psychosocial support 5. Can the patient actively participate in an intensive therapy program of at least 3 hrs of therapy per day at least 5 days per week? Potentially 6. The potential for patient to make measurable gains while on inpatient rehab is good 7. Anticipated functional outcomes upon discharge from inpatient rehab are max assist  with PT, max assist with OT, supervision and min assist with SLP. 8. Estimated rehab length of stay to reach the above functional goals is: 24-28 days. 9. Does the patient have adequate social supports and living environment to accommodate these discharge functional goals? No 10. Anticipated D/C setting: SNF 11. Anticipated post D/C treatments: SNF 12. Overall Rehab/Functional Prognosis: good and fair  RECOMMENDATIONS: This patient's condition is appropriate for continued rehabilitative care in the following setting: Pt without caregiver support at discharge, being the primary caretake of her husband, who also had a stroke.  Pt will reuqire long term therapy and care.  Recommend SNF. Spoke to son at length. Patient has agreed to participate in recommended program. Potentially Note that insurance prior authorization may be required for reimbursement for recommended care.  Comment: Rehab Admissions Coordinator to follow up.  Maryla MorrowAnkit Patel, MD, [place] DomFAAPMR 08/02/2016

## 2016-08-02 NOTE — Progress Notes (Signed)
Patient Name: Diana Bush Date of Encounter: 08/02/2016  Hospital Problem List     Active Problems:   CVA (cerebral infarction)   PAF (paroxysmal atrial fibrillation) (HCC)   Atrial fibrillation with RVR (HCC)   Cerebrovascular accident (CVA) due to thrombosis of right middle cerebral artery (HCC)   Hemiplegia affecting left nondominant side (HCC)   HLD (hyperlipidemia)   Dysphagia, post-stroke   Dysarthria, post-stroke   Expressive aphasia   Acute blood loss anemia   Benign essential HTN    Patient Profile     Diana Bush is a 75 y.o. female with a history of HTN and OA who was brought to the [facility] ED on 07/28/16 as a code stroke following acute onset of speech, right gaze and left hemiplegia. She was found to have an acute CVA s/p failed TPA and had IR guided thrombectomy. Noted to go into afib with RVR and cardiology consulted.     Subjective   Aphasia   Inpatient Medications    . aspirin EC  325 mg Oral Daily  . chlorhexidine  15 mL Mouth/Throat BID  . digoxin  0.25 mg Oral Daily  . famotidine (PEPCID) IV  20 mg Intravenous Q12H    Vital Signs    Vitals:   08/02/16 0600 08/02/16 0630 08/02/16 0800 08/02/16 1017  BP: (!) 145/85 (!) 165/93    Pulse: 67 77  85  Resp: 15 18    Temp:   98.9 F (37.2 C)   TempSrc:   Axillary   SpO2: 97% 98%    Weight:      Height:        Intake/Output Summary (Last 24 hours) at 08/02/16 1138 Last data filed at 08/02/16 0600  Gross per 24 hour  Intake             1675 ml  Output               50 ml  Net             1625 ml   Filed Weights   07/31/16 0340 08/01/16 0500 08/02/16 0400  Weight: 184 lb 8.4 oz (83.7 kg) 179 lb 10.8 oz (81.5 kg) 180 lb 12.4 oz (82 kg)    Physical Exam    GEN: Well nourished, well developed, in  no acute distress.  Neck: Supple, no JVD, carotid bruits, or masses. Cardiac: RRR,  rubs, or gallops. No clubbing, cyanosis, no edema.  Radials/DP/PT 2+ and equal bilaterally.  Respiratory:   Respirations  regular and unlabored, clear to auscultation bilaterally. GI: Soft, nontender, nondistended, BS + x 4.    Labs    CBC  Recent Labs  08/01/16 0240 08/02/16 0308  WBC 6.2 5.6  NEUTROABS 4.3 3.4  HGB 9.9* 9.1*  HCT 32.1* 29.8*  MCV 86.3 86.1  PLT 168 169   Basic Metabolic Panel  Recent Labs  07/30/16 1701  08/01/16 0240 08/02/16 0308  NA  --   < > 139 136  K  --   < > 3.3* 3.7  CL  --   < > 106 105  CO2  --   < > 25 24  GLUCOSE  --   < > 112* 93  BUN  --   < > 7 8  CREATININE  --   < > 0.61 0.55  CALCIUM  --   < > 9.1 9.0  MG 1.9  --   --   --   PHOS 2.4*  --   --   --   < > =  values in this interval not displayed. Liver Function Tests No results for input(s): AST, ALT, ALKPHOS, BILITOT, PROT, ALBUMIN in the last 72 hours. No results for input(s): LIPASE, AMYLASE in the last 72 hours. Cardiac Enzymes No results for input(s): CKTOTAL, CKMB, CKMBINDEX, TROPONINI in the last 72 hours. BNP Invalid input(s): POCBNP D-Dimer No results for input(s): DDIMER in the last 72 hours. Hemoglobin A1C No results for input(s): HGBA1C in the last 72 hours. Fasting Lipid Panel  Recent Labs  07/31/16 0149  TRIG 69   Thyroid Function Tests No results for input(s): TSH, T4TOTAL, T3FREE, THYROIDAB in the last 72 hours.  Invalid input(s): FREET3  Telemetry    NSR  ECG    Radiology    Mr Brain Wo Contrast  Result Date: 07/29/2016 CLINICAL DATA:  Continued surveillance RIGHT MCA infarction. EXAM: MRI HEAD WITHOUT CONTRAST TECHNIQUE: Multiplanar, multiecho pulse sequences of the brain and surrounding structures were obtained without intravenous contrast. COMPARISON:  Multiple priors. FINDINGS: Significant motion degradation. Brain: Large area of acute infarction within the distribution of the RIGHT MCA territory involves the insula, posterior frontal lobe, and parietal lobe, largely sparing the temporal lobe and anterior frontal lobe. Within limits for  visualization on motion degraded GRE sequence, no RIGHT hemisphere parenchymal hemorrhage. Tiny focus of chronic hemorrhage medial anterior thalamus, periventricular location, uncertain significance. Vascular: Flow voids are maintained in the internal carotid arteries, basilar artery, and LEFT greater than RIGHT vertebral arteries. Flow void has been re-established in the M1 and M2 segments of the RIGHT middle cerebral artery. Skull and upper cervical spine: Normal marrow signal. Sinuses/Orbits: Negative. Other: None. IMPRESSION: Significant progression of acute infarction compared with the preoperative code stroke CT, now with involvement of the insula, posterior frontal and anterior parietal cortex and regional white matter. No visible hemorrhagic transformation. Electronically Signed   By: Elsie StainJohn T Curnes M.D.   On: 07/29/2016 20:08   Dg Swallowing Func-speech Pathology  Result Date: 08/01/2016 Objective Swallowing Evaluation: Type of Study: MBS-Modified Barium Swallow Study Patient Details Name: Janann ColonelMary Bertran MRN: [id number] Date of Birth: 11/23/1940 Today's Date: 08/01/2016 Time: SLP Start Time (ACUTE ONLY): 1230-SLP Stop Time (ACUTE ONLY): 1250 SLP Time Calculation (min) (ACUTE ONLY): 20 min Past Medical History: Past Medical History: Diagnosis Date . Hypertension  . Osteoarthritis  . PAF (paroxysmal atrial fibrillation) (HCC)  Past Surgical History: Past Surgical History: Procedure Laterality Date . RADIOLOGY WITH ANESTHESIA N/A 07/28/2016  Procedure: RADIOLOGY WITH ANESTHESIA;  Surgeon: [name], MD;  Location: [facility] OR;  Service: Radiology;  Laterality: N/A; HPI: Diana BeanMary Garrisis an 75 y.o.femalewith a history of hypertension brought to the emergency room and code stroke status following acute onset of slurred speech, right gaze and left hemiplegia. CT scan of her head showed a small area of early infarction involving right MCA territory. Patient was deemed a candidate for TPA which was administered IV.  No significant change in  clinical deficits was seen.  Most recent MRI is showing significant progression of infarct with involvement in the insula, posterior frontal lob and anterior parietal lobe and regional white matter.    Subjective: The patient was seen sitting upright in bed with family present.  Assessment / Plan / Recommendation CHL IP CLINICAL IMPRESSIONS 08/01/2016 Therapy Diagnosis Moderate oral phase dysphagia;Mild pharyngeal phase dysphagia Clinical Impression Patient presents with a moderate oral dysphagia marked by left sided facial and labial weakness resulting in delayed oral transit, occassional anterior spillage of liquids, and mild oral residue post swallow. Pharyngeally, patient with a  delay in swallow initiation however great airway protection with only one episode of flash penetration noted. Pharyngeal strength intact.  Impact on safety and function Moderate aspiration risk   CHL IP TREATMENT RECOMMENDATION 08/01/2016 Treatment Recommendations Therapy as outlined in treatment plan below   Prognosis 08/01/2016 Prognosis for Safe Diet Advancement Good Barriers to Reach Goals -- Barriers/Prognosis Comment -- CHL IP DIET RECOMMENDATION 08/01/2016 SLP Diet Recommendations Dysphagia 1 (Puree) solids;Thin liquid Liquid Administration via Cup;Straw Medication Administration Whole meds with puree Compensations Slow rate;Small sips/bites;Lingual sweep for clearance of pocketing Postural Changes Seated upright at 90 degrees   CHL IP OTHER RECOMMENDATIONS 08/01/2016 Recommended Consults -- Oral Care Recommendations Oral care BID Other Recommendations --   CHL IP FOLLOW UP RECOMMENDATIONS 08/01/2016 Follow up Recommendations Inpatient Rehab   CHL IP FREQUENCY AND DURATION 08/01/2016 Speech Therapy Frequency (ACUTE ONLY) min 2x/week Treatment Duration 2 weeks      CHL IP ORAL PHASE 08/01/2016 Oral Phase Impaired Oral - Pudding Teaspoon -- Oral - Pudding Cup -- Oral - Honey Teaspoon Weak lingual  manipulation;Delayed oral transit;Lingual/palatal residue Oral - Honey Cup -- Oral - Nectar Teaspoon Weak lingual manipulation;Delayed oral transit;Lingual/palatal residue Oral - Nectar Cup Weak lingual manipulation;Delayed oral transit;Lingual/palatal residue Oral - Nectar Straw -- Oral - Thin Teaspoon -- Oral - Thin Cup Weak lingual manipulation;Delayed oral transit;Lingual/palatal residue Oral - Thin Straw Weak lingual manipulation;Delayed oral transit;Lingual/palatal residue Oral - Puree Weak lingual manipulation;Delayed oral transit;Lingual/palatal residue Oral - Mech Soft Weak lingual manipulation;Delayed oral transit;Lingual/palatal residue Oral - Regular -- Oral - Multi-Consistency -- Oral - Pill -- Oral Phase - Comment --  CHL IP PHARYNGEAL PHASE 08/01/2016 Pharyngeal Phase Impaired Pharyngeal- Pudding Teaspoon -- Pharyngeal -- Pharyngeal- Pudding Cup -- Pharyngeal -- Pharyngeal- Honey Teaspoon Delayed swallow initiation-vallecula Pharyngeal -- Pharyngeal- Honey Cup -- Pharyngeal -- Pharyngeal- Nectar Teaspoon Delayed swallow initiation-vallecula Pharyngeal -- Pharyngeal- Nectar Cup Delayed swallow initiation-vallecula Pharyngeal -- Pharyngeal- Nectar Straw -- Pharyngeal -- Pharyngeal- Thin Teaspoon -- Pharyngeal -- Pharyngeal- Thin Cup Delayed swallow initiation-vallecula Pharyngeal -- Pharyngeal- Thin Straw Delayed swallow initiation-vallecula Pharyngeal -- Pharyngeal- Puree Delayed swallow initiation-vallecula Pharyngeal -- Pharyngeal- Mechanical Soft Delayed swallow initiation-vallecula Pharyngeal -- Pharyngeal- Regular -- Pharyngeal -- Pharyngeal- Multi-consistency -- Pharyngeal -- Pharyngeal- Pill -- Pharyngeal -- Pharyngeal Comment --  CHL IP CERVICAL ESOPHAGEAL PHASE 08/01/2016 Cervical Esophageal Phase WFL Pudding Teaspoon -- Pudding Cup -- Honey Teaspoon -- Honey Cup -- Nectar Teaspoon -- Nectar Cup -- Nectar Straw -- Thin Teaspoon -- Thin Cup -- Thin Straw -- Puree -- Mechanical Soft -- Regular --  Multi-consistency -- Pill -- Cervical Esophageal Comment -- CHL IP GO 07/31/2016 Functional Assessment Tool Used ASHA NOMS and clinical judgment. Functional Limitations Motor speech Swallow Current Status [phone]) (None) Swallow Goal Status [phone]) (None) Swallow Discharge Status [phone]) (None) Motor Speech Current Status [phone]) CK Motor Speech Goal Status (G9562) CK Motor Speech Goal Status [phone]) (None) Spoken Language Comprehension Current Status [phone]) (None) Spoken Language Comprehension Goal Status (N6295) (None) Spoken Language Comprehension Discharge Status [phone]) (None) Spoken Language Expression Current Status [phone]) (None) Spoken Language Expression Goal Status [phone]) (None) Spoken Language Expression Discharge Status [phone]) (None) Attention Current Status (I3474) (None) Attention Goal Status (Q5956) (None) Attention Discharge Status [phone]) (None) Memory Current Status (E3329) (None) Memory Goal Status (J1884) (None) Memory Discharge Status (Z6606) (None) Voice Current Status (T0160) (None) Voice Goal Status (F0932) (None) Voice Discharge Status (T5573) (None) Other Speech-Language Pathology Functional Limitation [phone]) (None) Other Speech-Language Pathology Functional Limitation Goal Status (K2706) (None) Other  Speech-Language Pathology Functional Limitation Discharge Status [phone]) (None) Ferdinand Lango MA, CCC-SLP [phone] Ferdinand Lango Meryl 08/01/2016, 2:16 PM              Ct Head Code Stroke Wo Contrast`  Result Date: 07/29/2016 CLINICAL DATA:  Code stroke. Follow-up exam status post catheter directed right MCA thrombectomy. EXAM: CT HEAD WITHOUT CONTRAST TECHNIQUE: Contiguous axial images were obtained from the base of the skull through the vertex without intravenous contrast. COMPARISON:  Prior CT an arteriogram from earlier same day. FINDINGS: Brain: Cerebral volume stable. Mild chronic microvascular ischemic disease again noted. Scattered retained contrast material present within the  vasculature. No acute intracranial hemorrhage status post recent catheter directed therapy. Vague hypodensity involving the right insula suspicious for evolving ischemia. No other definite evolving infarct identified at this time. No mass lesion, midline shift or mass effect. No hydrocephalus. No extra-axial fluid collection. Vascular: Contrast material throughout the intracranial vasculature and dural sinuses. Skull: Scalp soft tissues within normal limits.  Calvarium stable. Sinuses/Orbits: Globes and orbits within normal limits. Scattered mucosal thickening throughout the paranasal sinuses. No air-fluid level to suggest active sinus infection. No mastoid effusion. Other: No other significant finding. IMPRESSION: 1. No complication identified status post recent catheter directed therapy for right MCA embolism. 2. Evolving hypodensity within the right insular cortex, compatible with evolving ischemia. Electronically Signed   By: Rise Mu M.D.   On: 07/29/2016 00:39   Ct Head Code Stroke W/o Cm  Result Date: 07/28/2016 CLINICAL DATA:  Code stroke.  LEFT-sided weakness and facial droop. EXAM: CT HEAD WITHOUT CONTRAST TECHNIQUE: Contiguous axial images were obtained from the base of the skull through the vertex without intravenous contrast. COMPARISON:  None. FINDINGS: BRAIN: Blurring of the RIGHT insula and RIGHT frontal lobe cortex with RIGHT dense MCA/insular dot sign. No intraparenchymal hemorrhage, mass effect, midline shift. Ventricles and sulci are normal for the patient's age. Minimal white matter changes exclusive of the aforementioned abnormality compatible with chronic small vessel ischemic disease, less than expected for age. No abnormal extra-axial fluid collections. Basal cisterns are patent. VASCULAR: Mild calcific atherosclerosis of the carotid siphons. SKULL: No skull fracture. Moderate bilateral temporomandibular osteoarthrosis. No significant scalp soft tissue swelling.  SINUSES/ORBITS: The mastoid air-cells and included paranasal sinuses are well-aerated.The included ocular globes and orbital contents are non-suspicious. OTHER: None. ASPECTS [facility] Stroke Program Early CT Score) - Ganglionic level infarction (caudate, lentiform nuclei, internal capsule, insula, M1-M3 cortex): 5 - Supraganglionic infarction (M4-M6 cortex): 3 Total score (0-10 with 10 being normal): 8 IMPRESSION: 1. Acute RIGHT MCA territory nonhemorrhagic infarct with dense RIGHT MCA. Otherwise negative CT HEAD for age. 2. ASPECTS is 8. Critical Value/emergent results were called by telephone at the time of interpretation on 07/28/2016 at 8:48 pm to Dr. Roseanne Reno, Neurology, who verbally acknowledged these results. Electronically Signed   By: Awilda Metro M.D.   On: 07/28/2016 20:47    Assessment & Plan    CVA:   No plan for anticoagulation for the next two weeks.   Eventual DOAC  HTN:  BP is elevated.  Some permissive HTN.  Defer target BP to neuro.  Start low dose beta blocker if a BP med is started.    ATRIAL FIB:  Now NSR.    Signed, Rollene Rotunda, MD  08/02/2016, 11:38 AM

## 2016-08-02 NOTE — Progress Notes (Signed)
Nutrition Follow-up  DOCUMENTATION CODES:   Obesity unspecified  INTERVENTION:   Ensure Enlive po BID, each supplement provides 350 kcal and 20 grams of protein  NUTRITION DIAGNOSIS:   Inadequate oral intake related to dysphagia as evidenced by meal completion < 50%. Ongoing.   GOAL:   Patient will meet greater than or equal to 90% of their needs Progressing.   MONITOR:   Supplement acceptance, PO intake  ASSESSMENT:   75yo female with acute stroke s/p systemic TPA and IR revascularization with post op vent needs.   9/22 pt extubated Diet advanced Plan for SNF at d/c.  Meds/labs reviewed  Diet Order:  DIET - DYS 1 Room service appropriate? Yes; Fluid consistency: Thin  Skin:  Reviewed, no issues  Last BM:  9/24  Height:   Ht Readings from Last 1 Encounters:  07/29/16 5\' 5"  (1.651 m)    Weight:   Wt Readings from Last 1 Encounters:  08/02/16 180 lb 12.4 oz (82 kg)    Ideal Body Weight:  56.8 kg  BMI:  Body mass index is 30.08 kg/m.  Estimated Nutritional Needs:   Kcal:  1400-1600  Protein:  70-80 grams  Fluid:  > 1.5 L/day  EDUCATION NEEDS:   No education needs identified at this time  Kendell BaneHeather Kailen Name RD, LDN, CNSC [phone] Pager [phone] After Hours Pager

## 2016-08-02 NOTE — Progress Notes (Signed)
Physical Therapy Treatment Patient Details Name: Diana Bush MRN: [id number] DOB: August 19, 1941 Today's Date: 08/02/2016    History of Present Illness 75 y.o. female admitted to [facility] on 07/28/16 with R MCA CVA s/p tPA and clot extraction on 07/28/16.  Pt intubated for IR proceedure and extubated 07/30/16.  Pt with significant PMhx of PAF, OA, HTN.  Pt in PAF post procedure and cardiology consulted and following.  Pt currently (at time of eval) on IV HR control meds.      PT Comments    Pt is more alert and interactive today.  She was able to write some of her needs on paper as her speech continues to be significantly impaired.  She spent more time EOB both writing, exercising, and interacting with her son.  VSS throughout.  Pt would benefit from continued acute therapy to maximize her mobility potential.    Follow Up Recommendations  CIR     Equipment Recommendations  Wheelchair (measurements PT);Wheelchair cushion (measurements PT);Hospital bed;Other (comment) (hoyer lift)    Recommendations for Other Services Rehab consult     Precautions / Restrictions Precautions Precautions: Fall Precaution Comments: left sided hemiplegia    Mobility  Bed Mobility Overal bed mobility: Needs Assistance Bed Mobility: Supine to Sit;Sit to Supine     Supine to sit: HOB elevated;Max assist Sit to supine: Max assist   General bed mobility comments: Pt needed max assist to progresses both legs over EOB (despite being able to move the right one, it was hard for her to sequence it over).  Legs moved first, then hips a little, then trunk lifted off of elevated HOB.  Pt able to lean back when getting back in bed usinng gravity and a little bit of her right arm to prop on her right elbow initially, therapist helping with both legs.    Transfers                 General transfer comment: NT, pt is pushing and very dense deficits on her left side.  She would be safest using a maxi move for OOB right  now.       Modified Rankin (Stroke Patients Only) Modified Rankin (Stroke Patients Only) Pre-Morbid Rankin Score: No symptoms Modified Rankin: Severe disability     Balance Overall balance assessment: Needs assistance Sitting-balance support: Feet supported;Single extremity supported Sitting balance-Leahy Scale: Zero Sitting balance - Comments: Zero to poor sitting balance EOB.  At times, pt pushes to the left and posteriorly and needs max assist for support.  At times she can be mod assist for balance EOB.  Pt is 50% of the time able to tell me which way she is leaning, however, has a hard time correcting it unassisted.                             Cognition Arousal/Alertness: Awake/alert Behavior During Therapy: WFL for tasks assessed/performed Overall Cognitive Status: Impaired/Different from baseline Area of Impairment: Following commands;Problem solving;Safety/judgement;Awareness       Following Commands: Follows one step commands consistently Safety/Judgement: Decreased awareness of deficits Awareness: Intellectual Problem Solving: Decreased initiation;Difficulty sequencing;Requires verbal cues;Requires tactile cues General Comments: Pt communicating better today with writing out things on paper.  She is tearful throughout session showing some increased awareness of what is going on, however, decreased awareness functionally.  She is able to follow one step commands on her stronger, right side.  No active movement noted on the left  today, seems to have some increased tone in the left arm.     Exercises General Exercises - Lower Extremity Ankle Circles/Pumps: AROM;Right;10 reps Long Arc Quad: AROM;Right;10 reps Hip Flexion/Marching: AROM;Right;10 reps    General Comments General comments (skin integrity, edema, etc.): Pt with right gaze preference, however, (this is the second session in a row this has happened), when we return to supine from sitting, she is  looking left for a good 5 mins.        Pertinent Vitals/Pain Pain Assessment: Faces Faces Pain Scale: Hurts a little bit Pain Location: left ankle with ROM Pain Descriptors / Indicators: Grimacing Pain Intervention(s): Limited activity within patient's tolerance;Monitored during session;Repositioned           PT Goals (current goals can now be found in the care plan section) Acute Rehab PT Goals Patient Stated Goal: to get better Progress towards PT goals: Progressing toward goals    Frequency    Min 4X/week      PT Plan Current plan remains appropriate       End of Session   Activity Tolerance: Patient limited by fatigue Patient left: in bed;with call bell/phone within reach;with family/visitor present     Time: [id number] PT Time Calculation (min) (ACUTE ONLY): 47 min  Charges:  $Therapeutic Exercise: 8-22 mins $Therapeutic Activity: 23-37 mins                      Alliene Klugh B. Toni Demo, PT, DPT [phone]   08/02/2016, 2:47 PM

## 2016-08-02 NOTE — Progress Notes (Addendum)
**  Preliminary report by tech**  Carotid artery duplex completed. Technically difficult study due to patient movement, cooperation, vessel tortuosity, and acoustic shadowing. Findings are consistent with a 1-39 percent stenosis involving the right internal carotid artery and the left internal carotid artery. The vertebral arteries demonstrate antegrade flow.  08/02/16 11:37 AM Olen CordialGreg Derrien Anschutz RVT

## 2016-08-02 NOTE — Progress Notes (Signed)
Rehab admissions - Please see rehab consult done by Dr. Allena KatzPatel recommending SNF placement.  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  No caregiver support for patient and she will need a lot of help at discharge.  Agree with pursuit of SNF placement to allow lengthy recovery time needed.  Call me for questions.  #[phone]

## 2016-08-02 NOTE — Care Management Important Message (Signed)
Important Message  Patient Details  Name: Diana Bush MRN: [id number] Date of Birth: 11/23/1940   Medicare Important Message Given:  Other (see comment)    Cache Bills Abena 08/02/2016, 11:10 AM

## 2016-08-02 NOTE — Progress Notes (Signed)
Patient transferred to [id number]. Patient is alert, oriented x4, speech severely slurred, NIHHS as charted. Skin is intact. Tele set up. Bed alarm on

## 2016-08-03 DIAGNOSIS — F32A Depression, unspecified: Secondary | ICD-10-CM | POA: Diagnosis present

## 2016-08-03 DIAGNOSIS — F329 Major depressive disorder, single episode, unspecified: Secondary | ICD-10-CM | POA: Diagnosis present

## 2016-08-03 LAB — CBC WITH DIFFERENTIAL/PLATELET
BASOS PCT: 0 %
Basophils Absolute: 0 10*3/uL (ref 0.0–0.1)
Eosinophils Absolute: 0.2 10*3/uL (ref 0.0–0.7)
Eosinophils Relative: 4 %
HEMATOCRIT: 33 % — AB (ref 36.0–46.0)
HEMOGLOBIN: 10.2 g/dL — AB (ref 12.0–15.0)
Lymphocytes Relative: 27 %
Lymphs Abs: 1.3 10*3/uL (ref 0.7–4.0)
MCH: 26.4 pg (ref 26.0–34.0)
MCHC: 30.9 g/dL (ref 30.0–36.0)
MCV: 85.5 fL (ref 78.0–100.0)
MONOS PCT: 11 %
Monocytes Absolute: 0.5 10*3/uL (ref 0.1–1.0)
NEUTROS ABS: 2.8 10*3/uL (ref 1.7–7.7)
NEUTROS PCT: 58 %
Platelets: 209 10*3/uL (ref 150–400)
RBC: 3.86 MIL/uL — AB (ref 3.87–5.11)
RDW: 14.2 % (ref 11.5–15.5)
WBC: 4.9 10*3/uL (ref 4.0–10.5)

## 2016-08-03 LAB — GLUCOSE, CAPILLARY
GLUCOSE-CAPILLARY: 101 mg/dL — AB (ref 65–99)
GLUCOSE-CAPILLARY: 107 mg/dL — AB (ref 65–99)
GLUCOSE-CAPILLARY: 91 mg/dL (ref 65–99)
Glucose-Capillary: 95 mg/dL (ref 65–99)
Glucose-Capillary: 106 mg/dL — ABNORMAL HIGH (ref 65–99)

## 2016-08-03 MED ORDER — DILTIAZEM HCL 100 MG IV SOLR
5.0000 mg/h | INTRAVENOUS | Status: DC
  Administered 2016-08-03: 5 mg/h via INTRAVENOUS
  Filled 2016-08-03: qty 100

## 2016-08-03 MED ORDER — SERTRALINE HCL 50 MG PO TABS
50.0000 mg | ORAL_TABLET | Freq: Every day | ORAL | Status: DC
  Administered 2016-08-03: 50 mg via ORAL
  Filled 2016-08-03: qty 1

## 2016-08-03 MED ORDER — METOPROLOL TARTRATE 25 MG PO TABS
25.0000 mg | ORAL_TABLET | Freq: Four times a day (QID) | ORAL | Status: DC
  Administered 2016-08-03 – 2016-08-04 (×5): 25 mg via ORAL
  Filled 2016-08-03 (×5): qty 1

## 2016-08-03 MED ORDER — METOPROLOL TARTRATE 5 MG/5ML IV SOLN
2.5000 mg | Freq: Once | INTRAVENOUS | Status: AC
  Administered 2016-08-03: 2.5 mg via INTRAVENOUS
  Filled 2016-08-03: qty 5

## 2016-08-03 NOTE — Progress Notes (Signed)
Pt is now sleeping. Will cont to monitor patient.

## 2016-08-03 NOTE — Progress Notes (Signed)
Pt SR on the monitor. Pt resting in bed listening to [place] on pandora.

## 2016-08-03 NOTE — Progress Notes (Signed)
Pt's Hr in the 100's-130's,  A change in the qrs complex noted. EKG obtained. NP on call paged. VS stable. Will cont to monitor pt.

## 2016-08-03 NOTE — Progress Notes (Signed)
Physical Therapy Treatment Patient Details Name: Diana ColonelMary Eichel MRN: [id number] DOB: 11/23/1940 Today's Date: 08/03/2016    History of Present Illness 75 y.o. female [facility] with R MCA CVA s/p tPA and clot extraction on 07/28/16.  Pt intubated for IR proceedure and extubated 07/30/16.  Pt with significant PMhx of PAF, OA, HTN.  Pt in PAF post procedure and cardiology consulted and following.  Pt currently (at time of eval) on IV HR control meds.      PT Comments    Patient seen for mobility progression, attempted some LE ROM and activity in supine. Performed some EOB trunk activation. During session, patient was noted to have had incontinence, assisted patient OOB to chair while linens changed. Hygiene and pericare performed. Patient assisted back to bed.  Follow Up Recommendations  CIR     Equipment Recommendations  Wheelchair (measurements PT);Wheelchair cushion (measurements PT);Hospital bed;Other (comment) (hoyer lift)    Recommendations for Other Services Rehab consult     Precautions / Restrictions Precautions Precautions: Fall Precaution Comments: left sided hemiplegia    Mobility  Bed Mobility Overal bed mobility: Needs Assistance Bed Mobility: Rolling Rolling: Max assist   Supine to sit: Max assist Sit to supine: Max assist   General bed mobility comments: Max assist to come to EOB with LEs movement and scoop technique to bring trunk to upright position. similar technique used to return to supine  Transfers Overall transfer level: Needs assistance   Transfers: Sit to/from BJ'sStand;Stand Pivot Transfers Sit to Stand: Total assist Stand pivot transfers: Total assist       General transfer comment: Total assist via face to face with gait belt to chair, then back to bed after sitting in chair ~10 minutes  Ambulation/Gait                 Stairs            Wheelchair Mobility    Modified Rankin (Stroke Patients Only) Modified Rankin  (Stroke Patients Only) Pre-Morbid Rankin Score: No symptoms Modified Rankin: Severe disability     Balance Overall balance assessment: Needs assistance Sitting-balance support: Feet supported Sitting balance-Leahy Scale: Zero Sitting balance - Comments: poor trunk control left lateral lean unable to maintain EOB static support                            Cognition Arousal/Alertness: Awake/alert Behavior During Therapy: Impulsive Overall Cognitive Status: Impaired/Different from baseline Area of Impairment: Following commands;Attention   Current Attention Level: Sustained   Following Commands: Follows one step commands inconsistently       General Comments: pt crying and moaning on arrival. Rn staff report large portion of the day Pt tossing R LE off bed and pointing to chair. pt very impulsive and unsafe to leave up in the chair at this time. pt pushign with R UE ( pushers syndrom like behavior) with repositioning x4 during session supine     Exercises General Exercises - Lower Extremity Ankle Circles/Pumps: AROM;10 reps (easily distracted)    General Comments General comments (skin integrity, edema, etc.): hygiene and pericare performed due to incontinence      Pertinent Vitals/Pain Pain Assessment: Faces Pain Score: 4  Pain Location: left UE shoulder when moving left arm Pain Descriptors / Indicators: Grimacing;Guarding Pain Intervention(s): Monitored during session    Home Living  Prior Function            PT Goals (current goals can now be found in the care plan section) Acute Rehab PT Goals Patient Stated Goal: to get better PT Goal Formulation: Patient unable to participate in goal setting Time For Goal Achievement: 08/13/16 Potential to Achieve Goals: Good Progress towards PT goals: Progressing toward goals    Frequency    Min 4X/week      PT Plan Current plan remains appropriate    Co-evaluation              End of Session   Activity Tolerance: Patient limited by fatigue Patient left: in bed;with call bell/phone within reach;with bed alarm set     Time: 4098-1191 PT Time Calculation (min) (ACUTE ONLY): 25 min  Charges:  $Therapeutic Activity: 23-37 mins                    G CodesFabio Asa August 20, 2016, 5:30 PM Charlotte Crumb, PT DPT  [phone]

## 2016-08-03 NOTE — Progress Notes (Addendum)
Patient with persistently high HR in atrial fibrillation.  I discussed with on call cardiology who recommended restarting diltiazem drip. I have written for a titrated diltiazem drip and requested transfer to a unit that can do this.   Diana Bush Diana Suder, MD [facility] [phone]  If 7pm- 7am, please page neurology on call as listed in [facility].

## 2016-08-03 NOTE — Progress Notes (Signed)
Patient Name: Diana Bush Date of Encounter: 08/03/2016  Hospital Problem List     Active Problems:   CVA (cerebral infarction)   PAF (paroxysmal atrial fibrillation) (HCC)   Atrial fibrillation with RVR (HCC)   Cerebrovascular accident (CVA) due to thrombosis of right middle cerebral artery (HCC)   Hemiplegia affecting left nondominant side (HCC)   HLD (hyperlipidemia)   Dysphagia, post-stroke   Dysarthria, post-stroke   Expressive aphasia   Acute blood loss anemia   Benign essential HTN    Subjective   Aphasic but trying to make coherent sentences.   Inpatient Medications    . aspirin EC  325 mg Oral Daily  . atorvastatin  20 mg Oral q1800  . chlorhexidine  15 mL Mouth/Throat BID  . digoxin  0.25 mg Oral Daily  . enoxaparin (LOVENOX) injection  40 mg Subcutaneous Q24H  . metoprolol tartrate  25 mg Oral BID  . pantoprazole  40 mg Oral Daily    Vital Signs    Vitals:   08/03/16 0403 08/03/16 0407 08/03/16 0437 08/03/16 0450  BP: 124/71 102/71 120/89 125/84  Pulse: 65 98 (!) 106 97  Resp: 20 (!) 21 14 19   Temp:      TempSrc:      SpO2: 99% 98% 99% 97%  Weight:      Height:        Intake/Output Summary (Last 24 hours) at 08/03/16 0727 Last data filed at 08/03/16 0600  Gross per 24 hour  Intake           290.58 ml  Output                0 ml  Net           290.58 ml   Filed Weights   08/02/16 0400 08/03/16 0205 08/03/16 0337  Weight: 180 lb 12.4 oz (82 kg) 168 lb 3.2 oz (76.3 kg) 171 lb 6.4 oz (77.7 kg)    Physical Exam    General: Pleasant, NAD. Neuro: Alert and oriented X 3. Moves all extremities spontaneously. Psych: Normal affect. HEENT:  Normal  Neck: Supple without bruits or JVD. Lungs:  Resp regular and unlabored, CTA. Heart: RRR no s3, s4, or murmurs. Abdomen: Soft, non-tender, non-distended, BS + x 4.  Extremities: No clubbing, cyanosis or edema. DP/PT/Radials 2+ and equal bilaterally.  Labs    CBC  Recent Labs  08/02/16 0308  08/03/16 0252  WBC 5.6 4.9  NEUTROABS 3.4 2.8  HGB 9.1* 10.2*  HCT 29.8* 33.0*  MCV 86.1 85.5  PLT 169 209   Basic Metabolic Panel  Recent Labs  08/01/16 0240 08/02/16 0308  NA 139 136  K 3.3* 3.7  CL 106 105  CO2 25 24  GLUCOSE 112* 93  BUN 7 8  CREATININE 0.61 0.55  CALCIUM 9.1 9.0   Liver Function Tests No results for input(s): AST, ALT, ALKPHOS, BILITOT, PROT, ALBUMIN in the last 72 hours. No results for input(s): LIPASE, AMYLASE in the last 72 hours. Cardiac Enzymes No results for input(s): CKTOTAL, CKMB, CKMBINDEX, TROPONINI in the last 72 hours. BNP Invalid input(s): POCBNP D-Dimer No results for input(s): DDIMER in the last 72 hours. Hemoglobin A1C No results for input(s): HGBA1C in the last 72 hours. Fasting Lipid Panel No results for input(s): CHOL, HDL, LDLCALC, TRIG, CHOLHDL, LDLDIRECT in the last 72 hours. Thyroid Function Tests No results for input(s): TSH, T4TOTAL, T3FREE, THYROIDAB in the last 72 hours.  Invalid input(s): FREET3  Telemetry    Atrial Fib  ECG    N/A  Radiology    TTE: 07/30/16  Study Conclusions  - Left ventricle: The cavity size was normal. There was moderate   concentric hypertrophy. Systolic function was vigorous. The   estimated ejection fraction was in the range of 65% to 70%. Wall   motion was normal; there were no regional wall motion   abnormalities. The study was not technically sufficient to allow   evaluation of LV diastolic dysfunction due to atrial   fibrillation. - Aortic valve: Trileaflet; mildly thickened, mildly calcified   leaflets. There was mild regurgitation. - Aortic root: The aortic root was normal in size. - Mitral valve: Calcified annulus. Mildly thickened leaflets .   There was no regurgitation. - Left atrium: The atrium was normal in size. - Right ventricle: Systolic function was normal. - Right atrium: The atrium was mildly dilated. - Tricuspid valve: There was moderate regurgitation. -  Pulmonic valve: There was mild regurgitation. - Pulmonary arteries: The main pulmonary artery was normal-sized.   Systolic pressure was moderately increased. PA peak pressure: 48   mm Hg (S). - Inferior vena cava: The vessel was dilated. The respirophasic   diameter changes were blunted (< 50%), consistent with elevated   central venous pressure. - Pericardium, extracardiac: There was no pericardial effusion.   Assessment & Plan    75 y.o. femalewith a history of HTN and OA who [facility] as a code stroke following acute onset of speech, right gaze and left hemiplegia. She was found to have an acute CVA s/p failed TPA and hadIR guided thrombectomy. Noted to go into afib with RVR and cardiology consulted.   1. CVA: Neurology following. No plans for anticoagulation for the next 2 weeks. Eventual will need DOAC.  2. Atrial Fib: Appears to have converted back to Atrial Fib with RVR last night and was transferred to 3W to be placed on Cardizem drip. Rates improved this morning, but remains in a-fib. May need to increase BB? Continue with Cardizem drip for now. -- On 325mg  ASA at this time. Will need DOAC when ok with neurology.   3. HTN: Bp improved, defer to neuro regarding target for bp control.   Signed, Laverda PageLindsay Roberts NP-C Pager [phone]  History and all data above reviewed.  Patient examined.  I agree with the findings as above.  Tearful.  No distressThe patient exam reveals UJW:[id number]:Irregular  ,  Lungs: Clear  ,  Abd: Positive bowel sounds, no rebound no guarding, Ext No edema   .  All available labs, radiology testing, previous records reviewed. Agree with documented assessment and plan. Atrial fib:  No anticoagulation for 10 - 14 days.  I will increase beta blocker to QID and stop Cardizem IV  Rollene RotundaJames Montel Vanderhoof  10:56 AM  08/03/2016

## 2016-08-03 NOTE — Progress Notes (Signed)
Report called to 3W. Pt transferred to 3W25 for Diltiazem drip.

## 2016-08-03 NOTE — Progress Notes (Signed)
Speech Language Pathology Treatment: Dysphagia;Cognitive-Linquistic  Patient Details Name: Janann ColonelMary Busser MRN: [id number] DOB: 11/23/1940 Today's Date: 08/03/2016 Time: 4098-11911012-1031 SLP Time Calculation (min) (ACUTE ONLY): 19 min  Assessment / Plan / Recommendation Clinical Impression  Pt had anterior loss of saliva upon SLP arrival, requiring Mod cueing for emergent awareness and self-maintenance due to left-sided inattention and suspected sensory deficits. No overt signs of aspiration were seen with thin liquid trials, but pt declined pureed solids. When offered, pt shook her head "no" but wrote "yes yes already" on paper. SLP attempted to provide pt with a bolus, to which she again shook her head no and turned away. Pt did write her full name correctly on paper. Pt required Max multimodal cueing for left-sided attention, sustained attention, and basic problem solving throughout simple, structured tasks (clock drawing, visual discrimination tasks). Question an element of visual impairment as well. Pt needs additional SLP f/u to maximize functional cognition and communication.   HPI HPI: [name] an 75 y.o.femalewith a history of hypertension brought to the emergency room and code stroke status following acute onset of slurred speech, right gaze and left hemiplegia. CT scan of her head showed a small area of early infarction involving right MCA territory. Patient was deemed a candidate for TPA which was administered IV. No significant change in  clinical deficits was seen.  Most recent MRI is showing significant progression of infarct with involvement in the insula, posterior frontal lob and anterior parietal lobe and regional white matter.         SLP Plan  Continue with current plan of care     Recommendations  Diet recommendations: Dysphagia 1 (puree);Thin liquid Liquids provided via: Cup;Straw Medication Administration: Crushed with puree Supervision: Patient able to self feed;Full  supervision/cueing for compensatory strategies Compensations: Slow rate;Small sips/bites;Lingual sweep for clearance of pocketing Postural Changes and/or Swallow Maneuvers: Seated upright 90 degrees                Oral Care Recommendations: Oral care BID Follow up Recommendations: Inpatient Rehab Plan: Continue with current plan of care       GO                Maxcine Hamaiewonsky, Avion Patella 08/03/2016, 10:45 AM  Maxcine HamLaura Paiewonsky, M.A. CCC-SLP [phone]

## 2016-08-03 NOTE — Progress Notes (Signed)
Occupational Therapy Treatment Patient Details Name: Diana Bush MRN: Bush DOB: [birth year] Today's Date: 08/03/2016    History of present illness 75 y.o. female admitted to [facility] on 07/28/16 with R MCA CVA s/p tPA and clot extraction on 07/28/16.  Pt intubated for IR proceedure and extubated 07/30/16.  Pt with significant PMhx of PAF, OA, HTN.  Pt in PAF post procedure and cardiology consulted and following.  Pt currently (at time of eval) on IV HR control meds.     OT comments  Pt provided bed level adl care and repositioning this session. Pt demonstrates pushing with R UE. Pt could benefit next session from visual input of a mirror for upright posture and possible use of music to help patient progress.  Pt attempting to sing the words to songs and tapping hand to beat.    Follow Up Recommendations  CIR;Supervision/Assistance - 24 hour    Equipment Recommendations  Other (comment)    Recommendations for Other Services Rehab consult    Precautions / Restrictions Precautions Precautions: Fall Precaution Comments: left sided hemiplegia       Mobility Bed Mobility Overal bed mobility: Needs Assistance Bed Mobility: Rolling Rolling: Max assist   Supine to sit: Max assist     General bed mobility comments: Pt requires (A) for trunk elevation and pt reaching with R UE. pt pushing with R UE  Transfers                 General transfer comment: pt pushing and unsafe to leave upright in chair. Pt bed positioned to chair position at this time to help with upright posture. New linen provided to patient    Balance Overall balance assessment: Needs assistance                                 ADL Overall ADL's : Needs assistance/impaired Eating/Feeding: Maximal assistance;Bed level Eating/Feeding Details (indicate cue type and reason): provided apple sauce Grooming: Wash/dry face;Minimal assistance;Sitting                                  General ADL Comments: pt incontinent on arrival nad bed level care provided for hygiene. Pt rolling R and L with max cues. pt provided gospel music during session and pt singing words and tapping hands to music. Pt smiling at therapist and padding OT hand. pt calmed by the music and RN made aware.       Vision                     Perception     Praxis      Cognition   Behavior During Therapy: Impulsive Overall Cognitive Status: Impaired/Different from baseline Area of Impairment: Following commands;Attention   Current Attention Level: Sustained    Following Commands: Follows one step commands inconsistently       General Comments: pt crying and moaning on arrival. Rn staff report large portion of the day Pt tossing R LE off bed and pointing to chair. pt very impulsive and unsafe to leave up in the chair at this time. pt pushign with R UE ( pushers syndrom like behavior) with repositioning x4 during session supine     Extremity/Trunk Assessment               Exercises     Shoulder Instructions  General Comments      Pertinent Vitals/ Pain       Pain Assessment: No/denies pain Pain Location: pt crying and moaning on arrival but after linen change and gospel music pt not crying  Home Living                                          Prior Functioning/Environment              Frequency  Min 2X/week        Progress Toward Goals  OT Goals(current goals can now be found in the care plan section)  Progress towards OT goals: Progressing toward goals  Acute Rehab OT Goals Patient Stated Goal: to get better OT Goal Formulation: With patient Time For Goal Achievement: 08/13/16 Potential to Achieve Goals: Good ADL Goals Pt Will Perform Grooming: with min assist;sitting Pt Will Transfer to Toilet: with max assist;with +2 assist;bedside commode;stand pivot transfer Additional ADL Goal #1: Pt will tolerate sitting EOB with min  assist for 5 minutes as precursor to ADL. Additional ADL Goal #2: Pt will follow one step commands 100% of the time without cues.  Plan Discharge plan remains appropriate    Co-evaluation                 End of Session     Activity Tolerance Patient tolerated treatment well   Patient Left in bed;with call bell/phone within reach;with SCD's reapplied   Nurse Communication Mobility status        Time: [id number] OT Time Calculation (min): 24 min  Charges: OT General Charges $OT Visit: 1 Procedure OT Treatments $Self Care/Home Management : 23-37 mins  Diana Bush, Diana Bush 08/03/2016, 2:49 PM    Diana Bush, Diana Bush   OTR/L Pager: [phone] Office: [phone] .

## 2016-08-03 NOTE — Progress Notes (Signed)
STROKE TEAM PROGRESS NOTE   SUBJECTIVE (INTERVAL HISTORY) No family at bedside. Pt went back into atrial fibrillation during the night with RVR.  Started on cardizem drip and moved to cardiology unit. This am, rate controlled though still in atrial fibrillation.  Cardiology on board. Beta blocker increase to 4 times a day and Cardizem IV stopped.   OBJECTIVE Temp:  [98 F (36.7 C)-98.5 F (36.9 C)] 98 F (36.7 C) (09/26 0758) Pulse Rate:  [65-142] 85 (09/26 0807) Cardiac Rhythm: Atrial fibrillation (09/26 0855) Resp:  [14-28] 21 (09/26 0807) BP: (102-195)/(64-111) 129/91 (09/26 0807) SpO2:  [93 %-100 %] 93 % (09/26 0807) Weight:  [76.3 kg (168 lb 3.2 oz)-77.7 kg (171 lb 6.4 oz)] 77.7 kg (171 lb 6.4 oz) (09/26 0337)  CBC:   Recent Labs Lab 08/02/16 0308 08/03/16 0252  WBC 5.6 4.9  NEUTROABS 3.4 2.8  HGB 9.1* 10.2*  HCT 29.8* 33.0*  MCV 86.1 85.5  PLT 169 209    Basic Metabolic Panel:   Recent Labs Lab 07/30/16 0736 07/30/16 1701  08/01/16 0240 08/02/16 0308  NA 141  --   < > 139 136  K 3.6  --   < > 3.3* 3.7  CL 109  --   < > 106 105  CO2 25  --   < > 25 24  GLUCOSE 92  --   < > 112* 93  BUN 19  --   < > 7 8  CREATININE 0.75  --   < > 0.61 0.55  CALCIUM 8.4*  --   < > 9.1 9.0  MG 2.0 1.9  --   --   --   PHOS 3.0 2.4*  --   --   --   < > = values in this interval not displayed.  Lipid Panel:     Component Value Date/Time   CHOL 143 07/29/2016 0500   TRIG 69 07/31/2016 0149   HDL 43 07/29/2016 0500   CHOLHDL 3.3 07/29/2016 0500   VLDL 10 07/29/2016 0500   LDLCALC 90 07/29/2016 0500   HgbA1c:  Lab Results  Component Value Date   HGBA1C 5.6 07/29/2016   Urine Drug Screen:     Component Value Date/Time   LABOPIA NONE DETECTED 07/28/2016 2156   COCAINSCRNUR NONE DETECTED 07/28/2016 2156   LABBENZ NONE DETECTED 07/28/2016 2156   AMPHETMU NONE DETECTED 07/28/2016 2156   THCU NONE DETECTED 07/28/2016 2156   LABBARB NONE DETECTED 07/28/2016 2156       IMAGING I have personally reviewed the radiological images below and agree with the radiology interpretations.  MRI of the brain  07/29/2016 Significant progression of acute infarction compared with the preoperative code stroke CT, now with involvement of the insula,posterior frontal and anterior parietal cortex and regional white matter. No visible hemorrhagic transformation.  Ct Head Code Stroke W/o Cm 07/28/2016 1. Acute RIGHT MCA territory nonhemorrhagic infarct with dense RIGHT MCA. Otherwise negative CT HEAD for age.  2. ASPECTS is 8.   Cerebral angiogram 07/28/2016  S/P RT common carotid arteriogram,followed by near complete revascularization of occlded superior division of RT MCA with x 1pass with solitaire 4 mm x 40 mm FR retrieval device and 1.5 mg of superselective  Intraarterial intracranial INTEGRELIN achieving a TICI 2b reperfusion.  Ct Head post IR 07/29/2016 1. No complication identified status post recent catheter directed therapy for right MCA embolism.  2. Evolving hypodensity within the right insular cortex, compatible with evolving ischemia.   TTE - -  Left ventricle: The cavity size was normal. There was moderate   concentric hypertrophy. Systolic function was vigorous. The   estimated ejection fraction was in the range of 65% to 70%. Wall   motion was normal; there were no regional wall motion   abnormalities. The study was not technically sufficient to allow   evaluation of LV diastolic dysfunction due to atrial   fibrillation. - Aortic valve: Trileaflet; mildly thickened, mildly calcified   leaflets. There was mild regurgitation. - Aortic root: The aortic root was normal in size. - Mitral valve: Calcified annulus. Mildly thickened leaflets .   There was no regurgitation. - Left atrium: The atrium was normal in size. - Right ventricle: Systolic function was normal. - Right atrium: The atrium was mildly dilated. - Tricuspid valve: There was moderate  regurgitation. - Pulmonic valve: There was mild regurgitation. - Pulmonary arteries: The main pulmonary artery was normal-sized.   Systolic pressure was moderately increased. PA peak pressure: 48   mm Hg (S). - Inferior vena cava: The vessel was dilated. The respirophasic   diameter changes were blunted (< 50%), consistent with elevated   central venous pressure. - Pericardium, extracardiac: There was no pericardial effusion.  CUS Technically difficult study due to patient movement, cooperation, vessel tortuosity, and acoustic shadowing. Findings are consistent with a 1-39 percent stenosis involving the right internal carotid artery and the left internal carotid artery. The vertebral arteries demonstrate antegrade flow.   PHYSICAL EXAM  Temp:  [98 F (36.7 C)-98.5 F (36.9 C)] 98 F (36.7 C) (09/26 0758) Pulse Rate:  [65-142] 85 (09/26 0807) Resp:  [14-28] 21 (09/26 0807) BP: (102-195)/(64-111) 129/91 (09/26 0807) SpO2:  [93 %-100 %] 93 % (09/26 0807) Weight:  [76.3 kg (168 lb 3.2 oz)-77.7 kg (171 lb 6.4 oz)] 77.7 kg (171 lb 6.4 oz) (09/26 0337)  General - Well nourished, well developed, in no apparent distress.  Ophthalmologic - Fundi not visualized due to noncooperation.  Cardiovascular - irregularly irregular heart rate and rhythm.  Mental Status -  Level of arousal and orientation to time, place, and person were intact. Language showed able to have short sentences of spontaneous speech, able to follow all commands, name 2/2 and able to repeat, however, pt has severe dysarthria and left neglect.  Cranial Nerves II - XII - II - Visual field showed left visual field neglect. III, IV, VI - right gaze preference V - Facial sensation intact bilaterally. VII - left facial droop. VIII - Hearing & vestibular intact bilaterally. X - Palate elevates symmetrically. XI - Chin turning & shoulder shrug intact bilaterally. XII - Tongue protrusion to the left.  Motor Strength - The  patient's strength was 0/5 LUE and LLE, 5/5 RUE and 3/5 RLE due to general letheragy.  Bulk was normal and fasciculations were absent.   Motor Tone - Muscle tone was assessed at the neck and appendages and was normal.  Reflexes - The patient's reflexes were 1+ in all extremities and she had left babinski.  Sensory - Light touch, temperature/pinprick were assessed and were symmetrical.    Coordination - The patient had normal movements in the right hand with no ataxia or dysmetria.  Tremor was absent.  Gait and Station - not tested.    ASSESSMENT/PLAN [name] is a 75 y.o. female with history of HTN presenting with right gaze and left hemiplegia. She received IV t-PA 07/28/2016 at 2106. Taken to intervention, where she received near complete revascularization of occluded superior right MCA  with Solitaire and intra-arterial Integrilin with TICI 2B revascularization.  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 Speaks in Hebrew. Writes in [place].  CT head right MCA hyperdense sign  MRI Left MCA infarct. No visible hemorrhagic transformation.  Cerebral angio - occlusion of superior division of RT MCA s/p TICI2b revascularization   2D Echo EF 60-65%  CUS No significant stenosis   LDL 90  HgbA1c 5.6  SCDs for VTE prophylaxis DIET - DYS 1 Room service appropriate? Yes; Fluid consistency: Thin  No antithrombotic prior to admission, now on ASA PR 300 mg daily. Will need anticoagulation in 7-10 days for stroke prevention  Ongoing aggressive stroke risk factor management  Therapy recommendations:  SNF  Disposition - pending (was primary caregiver of her husband who has had a stroke PTA)  Atrial Fibrillation/Flutter with RVR new onset  New onset with heart rates  elevated, confirmed with EKG   Home anticoagulation:  None  CHA2DS2-VASc Score = 6, ?2 oral anticoagulation recommended  Age in Years:  ?65   +2    Sex:  Female   Female   +1    Hypertension History:  yes   +1     Diabetes Mellitus:  0   Congestive Heart Failure History:  0  Vascular Disease History:  0   Stroke/TIA/Thromboembolism History:  yes   +2  Back in AF w/ RVR during the night. Placed back on cardizem drip. Now off.  Metoprolol 25 mg qid now  Rate now controlled, though still in atrial fibrillation   Cardiology on board  start anticoagulation in 7-10 days given size of stroke  Hypertensive Emergency  BP on the high side   Treated with IV hydralazine and Cardene, now off drip  On metoprolol po  BP normotensive  BP goal normotensive in 3-5 days  Hyperlipidemia  Home meds:  No statin  LDL 90, goal < 70  On lipitor 20  Continue statin on discharge.  Depression  Last mom recently  Crying episode  Added Zoloft 50 mg Qhs   Other Stroke Risk Factors  Obesity, Body mass index is 28.52 kg/m., recommend weight loss, diet and exercise as appropriate   Other Active Problems  Hypokalemia, resolved after supplement  Hospital day # 6  Marvel Plan, MD PhD Stroke Neurology 08/03/2016 7:04 PM   To contact Stroke Continuity provider, please refer to [email or web address]. After hours, contact General Neurology

## 2016-08-03 NOTE — Progress Notes (Signed)
Pt crying and is upset. Emotional support given to patient. Patient has been repositioned several times. Pt refusing to eat and drink. Neurologist on call paged. Will cont to monitor pt.

## 2016-08-03 NOTE — Progress Notes (Signed)
Pt is tearful and restless this morning and afternoon. Emotional support given to patient. Pt also repositioned in bed multiple times. Np on call aware. Will cont to monitor pt.

## 2016-08-04 DIAGNOSIS — I10 Essential (primary) hypertension: Secondary | ICD-10-CM | POA: Diagnosis not present

## 2016-08-04 DIAGNOSIS — E876 Hypokalemia: Secondary | ICD-10-CM | POA: Diagnosis present

## 2016-08-04 DIAGNOSIS — F329 Major depressive disorder, single episode, unspecified: Secondary | ICD-10-CM | POA: Diagnosis not present

## 2016-08-04 DIAGNOSIS — I69354 Hemiplegia and hemiparesis following cerebral infarction affecting left non-dominant side: Secondary | ICD-10-CM | POA: Diagnosis not present

## 2016-08-04 DIAGNOSIS — R4182 Altered mental status, unspecified: Secondary | ICD-10-CM | POA: Diagnosis not present

## 2016-08-04 DIAGNOSIS — I161 Hypertensive emergency: Secondary | ICD-10-CM | POA: Diagnosis present

## 2016-08-04 DIAGNOSIS — I6789 Other cerebrovascular disease: Secondary | ICD-10-CM | POA: Diagnosis not present

## 2016-08-04 DIAGNOSIS — I69320 Aphasia following cerebral infarction: Secondary | ICD-10-CM | POA: Diagnosis not present

## 2016-08-04 DIAGNOSIS — I4891 Unspecified atrial fibrillation: Secondary | ICD-10-CM | POA: Diagnosis not present

## 2016-08-04 DIAGNOSIS — M542 Cervicalgia: Secondary | ICD-10-CM | POA: Diagnosis not present

## 2016-08-04 DIAGNOSIS — R471 Dysarthria and anarthria: Secondary | ICD-10-CM | POA: Diagnosis not present

## 2016-08-04 DIAGNOSIS — I63311 Cerebral infarction due to thrombosis of right middle cerebral artery: Secondary | ICD-10-CM | POA: Diagnosis not present

## 2016-08-04 DIAGNOSIS — M6281 Muscle weakness (generalized): Secondary | ICD-10-CM | POA: Diagnosis not present

## 2016-08-04 DIAGNOSIS — I69391 Dysphagia following cerebral infarction: Secondary | ICD-10-CM | POA: Diagnosis not present

## 2016-08-04 DIAGNOSIS — R4701 Aphasia: Secondary | ICD-10-CM | POA: Diagnosis not present

## 2016-08-04 DIAGNOSIS — I69322 Dysarthria following cerebral infarction: Secondary | ICD-10-CM | POA: Diagnosis not present

## 2016-08-04 DIAGNOSIS — E785 Hyperlipidemia, unspecified: Secondary | ICD-10-CM | POA: Diagnosis not present

## 2016-08-04 DIAGNOSIS — R1312 Dysphagia, oropharyngeal phase: Secondary | ICD-10-CM | POA: Diagnosis not present

## 2016-08-04 LAB — GLUCOSE, CAPILLARY
GLUCOSE-CAPILLARY: 109 mg/dL — AB (ref 65–99)
GLUCOSE-CAPILLARY: 85 mg/dL (ref 65–99)
GLUCOSE-CAPILLARY: 92 mg/dL (ref 65–99)
Glucose-Capillary: 91 mg/dL (ref 65–99)
Glucose-Capillary: 89 mg/dL (ref 65–99)

## 2016-08-04 LAB — CBC WITH DIFFERENTIAL/PLATELET
Basophils Absolute: 0 10*3/uL (ref 0.0–0.1)
Basophils Relative: 0 %
Eosinophils Absolute: 0.2 10*3/uL (ref 0.0–0.7)
Eosinophils Relative: 3 %
HCT: 33.8 % — ABNORMAL LOW (ref 36.0–46.0)
HEMOGLOBIN: 10.3 g/dL — AB (ref 12.0–15.0)
LYMPHS ABS: 1.3 10*3/uL (ref 0.7–4.0)
LYMPHS PCT: 26 %
MCH: 26.5 pg (ref 26.0–34.0)
MCHC: 30.5 g/dL (ref 30.0–36.0)
MCV: 86.9 fL (ref 78.0–100.0)
Monocytes Absolute: 0.7 10*3/uL (ref 0.1–1.0)
Monocytes Relative: 15 %
NEUTROS ABS: 2.8 10*3/uL (ref 1.7–7.7)
NEUTROS PCT: 56 %
Platelets: 215 10*3/uL (ref 150–400)
RBC: 3.89 MIL/uL (ref 3.87–5.11)
RDW: 14.4 % (ref 11.5–15.5)
WBC: 5 10*3/uL (ref 4.0–10.5)

## 2016-08-04 MED ORDER — ATORVASTATIN CALCIUM 20 MG PO TABS: 20.0000 mg | ORAL_TABLET | Freq: Every day | ORAL | 2 refills | Status: AC

## 2016-08-04 MED ORDER — DIGOXIN 250 MCG PO TABS: 0.2500 mg | ORAL_TABLET | Freq: Every day | ORAL | 2 refills | Status: DC

## 2016-08-04 MED ORDER — SERTRALINE HCL 50 MG PO TABS: 50.0000 mg | ORAL_TABLET | Freq: Every day | ORAL | 2 refills | Status: DC

## 2016-08-04 MED ORDER — METOPROLOL TARTRATE 25 MG PO TABS: 25.0000 mg | ORAL_TABLET | Freq: Four times a day (QID) | ORAL | 2 refills | Status: DC

## 2016-08-04 MED ORDER — ASPIRIN 325 MG PO TBEC: 325.0000 mg | DELAYED_RELEASE_TABLET | Freq: Every day | ORAL | 0 refills | Status: DC

## 2016-08-04 NOTE — Clinical Social Work Placement (Signed)
   CLINICAL SOCIAL WORK PLACEMENT  NOTE  Date:  08/04/2016  Patient Details  Name: Diana Bush MRN: [id number] Date of Birth: 11/23/1940  Clinical Social Work is seeking post-discharge placement for this patient at the Skilled  Nursing Facility level of care (*CSW will initial, date and re-position this form in  chart as items are completed):  Yes   Patient/family provided with [facility] Clinical Social Work Department's list of facilities offering this level of care within the geographic area requested by the patient (or if unable, by the patient's family).  Yes   Patient/family informed of their freedom to choose among providers that offer the needed level of care, that participate in Medicare, Medicaid or managed care program needed by the patient, have an available bed and are willing to accept the patient.  Yes   Patient/family informed of [facility]'s ownership interest in [facility] and [facility], as well as of the fact that they are under no obligation to receive care at these facilities.  PASRR submitted to EDS on 08/04/16     PASRR number received on 08/04/16     Existing PASRR number confirmed on       FL2 transmitted to all facilities in geographic area requested by pt/family on 08/04/16     FL2 transmitted to all facilities within larger geographic area on 08/04/16     Patient informed that his/her managed care company has contracts with or will negotiate with certain facilities, including the following:            Patient/family informed of bed offers received.  Patient chooses bed at       Physician recommends and patient chooses bed at      Patient to be transferred to   on  .  Patient to be transferred to facility by       Patient family notified on   of transfer.  Name of family member notified:        PHYSICIAN Please sign FL2     Additional Comment:    _______________________________________________ Rockwell GermanyAshley N Gardner, LCSW 08/04/2016,  2:53 PM

## 2016-08-04 NOTE — Progress Notes (Signed)
Patient Name: Diana Bush Date of Encounter: 08/04/2016  Hospital Problem List     Active Problems:   CVA (cerebral infarction)   PAF (paroxysmal atrial fibrillation) (HCC)   Atrial fibrillation with RVR (HCC)   Cerebrovascular accident (CVA) due to thrombosis of right middle cerebral artery (HCC)   Hemiplegia affecting left nondominant side (HCC)   HLD (hyperlipidemia)   Dysphagia, post-stroke   Dysarthria, post-stroke   Expressive aphasia   Acute blood loss anemia   Benign essential HTN   Depression    Subjective   Appears to have been agitated overnight per nursing staff. Aphasic but trying to make coherent sentences.   Inpatient Medications    . aspirin EC  325 mg Oral Daily  . atorvastatin  20 mg Oral q1800  . chlorhexidine  15 mL Mouth/Throat BID  . digoxin  0.25 mg Oral Daily  . enoxaparin (LOVENOX) injection  40 mg Subcutaneous Q24H  . metoprolol tartrate  25 mg Oral QID  . pantoprazole  40 mg Oral Daily  . sertraline  50 mg Oral QHS    Vital Signs    Vitals:   08/03/16 2021 08/03/16 2025 08/04/16 0021 08/04/16 0729  BP: (!) 164/91 (!) 164/91 (!) 170/101 (!) 150/80  Pulse: 70 72 76 77  Resp: 19 20 (!) 25 15  Temp:  97.6 F (36.4 C) 98.9 F (37.2 C) 99.1 F (37.3 C)  TempSrc:  Oral Oral Axillary  SpO2: 99% 98% 99% 97%  Weight:      Height:        Intake/Output Summary (Last 24 hours) at 08/04/16 0839 Last data filed at 08/03/16 1200  Gross per 24 hour  Intake              120 ml  Output                0 ml  Net              120 ml   Filed Weights   08/02/16 0400 08/03/16 0205 08/03/16 0337  Weight: 180 lb 12.4 oz (82 kg) 168 lb 3.2 oz (76.3 kg) 171 lb 6.4 oz (77.7 kg)    Physical Exam    General: Pleasant, NAD. Neuro: Responses to verbal stimuli, but aphasic. Moves all extremities spontaneously. Psych: Normal affect. HEENT:  Normal  Neck: Supple without bruits or JVD. Lungs:  Resp regular and unlabored, CTA. Heart: RRR no s3, s4, or  murmurs. Abdomen: Soft, non-tender, non-distended, BS + x 4.  Extremities: No clubbing, cyanosis or edema. DP/PT/Radials 2+ and equal bilaterally.  Labs    CBC  Recent Labs  08/03/16 0252 08/04/16 0409  WBC 4.9 5.0  NEUTROABS 2.8 2.8  HGB 10.2* 10.3*  HCT 33.0* 33.8*  MCV 85.5 86.9  PLT 209 215   Basic Metabolic Panel  Recent Labs  08/02/16 0308  NA 136  K 3.7  CL 105  CO2 24  GLUCOSE 93  BUN 8  CREATININE 0.55  CALCIUM 9.0   Liver Function Tests No results for input(s): AST, ALT, ALKPHOS, BILITOT, PROT, ALBUMIN in the last 72 hours. No results for input(s): LIPASE, AMYLASE in the last 72 hours. Cardiac Enzymes No results for input(s): CKTOTAL, CKMB, CKMBINDEX, TROPONINI in the last 72 hours. BNP Invalid input(s): POCBNP D-Dimer No results for input(s): DDIMER in the last 72 hours. Hemoglobin A1C No results for input(s): HGBA1C in the last 72 hours. Fasting Lipid Panel No results for input(s): CHOL, HDL, LDLCALC,  TRIG, CHOLHDL, LDLDIRECT in the last 72 hours. Thyroid Function Tests No results for input(s): TSH, T4TOTAL, T3FREE, THYROIDAB in the last 72 hours.  Invalid input(s): FREET3  Telemetry    SR, did have runs of SVT yesterday while converting from Atrial Fib.   ECG    A-Flutter, new TWI in leads II, III, aVF, and v1-v6  Radiology    TTE: 07/30/16  Study Conclusions  - Left ventricle: The cavity size was normal. There was moderate   concentric hypertrophy. Systolic function was vigorous. The   estimated ejection fraction was in the range of 65% to 70%. Wall   motion was normal; there were no regional wall motion   abnormalities. The study was not technically sufficient to allow   evaluation of LV diastolic dysfunction due to atrial   fibrillation. - Aortic valve: Trileaflet; mildly thickened, mildly calcified   leaflets. There was mild regurgitation. - Aortic root: The aortic root was normal in size. - Mitral valve: Calcified annulus.  Mildly thickened leaflets .   There was no regurgitation. - Left atrium: The atrium was normal in size. - Right ventricle: Systolic function was normal. - Right atrium: The atrium was mildly dilated. - Tricuspid valve: There was moderate regurgitation. - Pulmonic valve: There was mild regurgitation. - Pulmonary arteries: The main pulmonary artery was normal-sized.   Systolic pressure was moderately increased. PA peak pressure: 48   mm Hg (S). - Inferior vena cava: The vessel was dilated. The respirophasic   diameter changes were blunted (< 50%), consistent with elevated   central venous pressure. - Pericardium, extracardiac: There was no pericardial effusion.   Assessment & Plan    75 y.o. femalewith a history of HTN and OA who [facility] as a code stroke following acute onset of speech, right gaze and left hemiplegia. She was found to have an acute CVA s/p failed TPA and hadIR guided thrombectomy. Noted to go into afib with RVR and cardiology consulted.   1. CVA: Neurology following. No plans for anticoagulation for the next 2 weeks. Eventual will need DOAC.  2. Atrial Fib: Has now converted back to SR on telemetry. Was briefly restarted on Cardiezm drip, but transitioned off and BB increased yesterday to QID. HR and BP tolerating. Did have some episodes of SVT yesterday while converting to SR. Does have new TWI noted on EKG done yesterday afternoon.   3. HTN: Bp improved, defer to neuro regarding target for bp control.   Signed, Laverda PageLindsay Roberts NP-C Pager [phone]  History and all data above reviewed.  Patient examined.  I agree with the findings as above.  The patient exam reveals COR:RRR  ,  Lungs: Clear  ,  Abd: Positive bowel sounds, no rebound no guarding, Ext No edema  .  All available labs, radiology testing, previous records reviewed. Agree with documented assessment and plan. Atrial fib:  Continue IR beta blocker today and we will consolidate  tomorrow.    Fayrene FearingJames Markella Dao  11:25 AM  08/04/2016

## 2016-08-04 NOTE — Progress Notes (Addendum)
I called the clients daughter Diana Bush at [phone] to let her know that the client was very restless and taking all her care equipment off, wrapping her monitoring cords around herself  and trying to get out of bed to the point of being unsafe. She did not answer and a voicemail was left with my name and a call back number. The bed alarm is being used and we will now use a safety mitten on the clients right hand I will continue to monitor the client closely along with frequent rounds.  Sheppard Evensina Johnel Yielding RN  Addendum:   We are unable to keep the safety mitten on the client at this time. She has taken it off multiple times by using her mouth and teeth to pull it off. The other staff members including myself are taking turns sitting with her to keep her safe. I will continue to monitor her closely.  Sheppard Evensina Denard Tuminello RN

## 2016-08-04 NOTE — Progress Notes (Signed)
Pt family member Lurena JoinerRebecca is requesting to be callwed back and updated.Thanks

## 2016-08-04 NOTE — Progress Notes (Signed)
Report called into [facility]

## 2016-08-04 NOTE — Care Management Note (Signed)
Case Management Note  Patient Details  Name: Diana Bush MRN: [id number] Date of Birth: 11/23/1940  Subjective/Objective: Pt was a transfer to 3w. Plan will be d/c to SNF.                    Action/Plan: CSW assisting with disposition needs to [facility]. No further needs from CM at this time.   Expected Discharge Date:                  Expected Discharge Plan:  Skilled Nursing Facility  In-House Referral:  Clinical Social Work  Discharge planning Services  CM Consult  Post Acute Care Choice:  NA Choice offered to:  NA  DME Arranged:  N/A DME Agency:  NA  HH Arranged:  NA HH Agency:  NA  Status of Service:  Completed, signed off  If discussed at Long Length of Stay Meetings, dates discussed:    Additional Comments:  Gala LewandowskyGraves-Bigelow, Emiah Pellicano Kaye, RN 08/04/2016, 4:46 PM

## 2016-08-04 NOTE — Progress Notes (Signed)
STROKE TEAM PROGRESS NOTE   SUBJECTIVE (INTERVAL HISTORY) No family at bedside. She still has left UE 0/5 but left LE has muscle strength, however difficult with movement due to left knee pain .   OBJECTIVE Temp:  [97.6 F (36.4 C)-99.3 F (37.4 C)] 99.1 F (37.3 C) (09/27 0729) Pulse Rate:  [70-111] 80 (09/27 0932) Cardiac Rhythm: Normal sinus rhythm (09/27 0700) Resp:  [15-25] 15 (09/27 0729) BP: (137-170)/(80-101) 150/80 (09/27 0729) SpO2:  [97 %-99 %] 97 % (09/27 0729)  CBC:   Recent Labs Lab 08/03/16 0252 08/04/16 0409  WBC 4.9 5.0  NEUTROABS 2.8 2.8  HGB 10.2* 10.3*  HCT 33.0* 33.8*  MCV 85.5 86.9  PLT 209 215    Basic Metabolic Panel:   Recent Labs Lab 07/30/16 0736 07/30/16 1701  08/01/16 0240 08/02/16 0308  NA 141  --   < > 139 136  K 3.6  --   < > 3.3* 3.7  CL 109  --   < > 106 105  CO2 25  --   < > 25 24  GLUCOSE 92  --   < > 112* 93  BUN 19  --   < > 7 8  CREATININE 0.75  --   < > 0.61 0.55  CALCIUM 8.4*  --   < > 9.1 9.0  MG 2.0 1.9  --   --   --   PHOS 3.0 2.4*  --   --   --   < > = values in this interval not displayed.  Lipid Panel:     Component Value Date/Time   CHOL 143 07/29/2016 0500   TRIG 69 07/31/2016 0149   HDL 43 07/29/2016 0500   CHOLHDL 3.3 07/29/2016 0500   VLDL 10 07/29/2016 0500   LDLCALC 90 07/29/2016 0500   HgbA1c:  Lab Results  Component Value Date   HGBA1C 5.6 07/29/2016   Urine Drug Screen:     Component Value Date/Time   LABOPIA NONE DETECTED 07/28/2016 2156   COCAINSCRNUR NONE DETECTED 07/28/2016 2156   LABBENZ NONE DETECTED 07/28/2016 2156   AMPHETMU NONE DETECTED 07/28/2016 2156   THCU NONE DETECTED 07/28/2016 2156   LABBARB NONE DETECTED 07/28/2016 2156      IMAGING I have personally reviewed the radiological images below and agree with the radiology interpretations.  MRI of the brain  07/29/2016 Significant progression of acute infarction compared with the preoperative code stroke CT, now with  involvement of the insula,posterior frontal and anterior parietal cortex and regional white matter. No visible hemorrhagic transformation.  Ct Head Code Stroke W/o Cm 07/28/2016 1. Acute RIGHT MCA territory nonhemorrhagic infarct with dense RIGHT MCA. Otherwise negative CT HEAD for age.  2. ASPECTS is 8.   Cerebral angiogram 07/28/2016  S/P RT common carotid arteriogram,followed by near complete revascularization of occlded superior division of RT MCA with x 1pass with solitaire 4 mm x 40 mm FR retrieval device and 1.5 mg of superselective  Intraarterial intracranial INTEGRELIN achieving a TICI 2b reperfusion.  Ct Head post IR 07/29/2016 1. No complication identified status post recent catheter directed therapy for right MCA embolism.  2. Evolving hypodensity within the right insular cortex, compatible with evolving ischemia.   TTE  - Left ventricle: The cavity size was normal. There was moderate concentric hypertrophy. Systolic function was vigorous. The estimated ejection fraction was in the range of 65% to 70%. Wall motion was normal; there were no regional wall motion abnormalities. The study was not  technically sufficient to allow evaluation of LV diastolic dysfunction due to atrial fibrillation. - Aortic valve: Trileaflet; mildly thickened, mildly calcified leaflets. There was mild regurgitation. - Aortic root: The aortic root was normal in size. - Mitral valve: Calcified annulus. Mildly thickened leaflets. There was no regurgitation. - Left atrium: The atrium was normal in size. - Right ventricle: Systolic function was normal. - Right atrium: The atrium was mildly dilated. - Tricuspid valve: There was moderate regurgitation. - Pulmonic valve: There was mild regurgitation. - Pulmonary arteries: The main pulmonary artery was normal-sized. Systolic pressure was moderately increased. PA peak pressure: 48 mm Hg (S). - Inferior vena cava: The vessel was dilated. The respirophasic diameter  changes were blunted (< 50%), consistent with elevated central venous pressure. - Pericardium, extracardiac: There was no pericardial effusion.  CUS Technically difficult study due to patient movement, cooperation, vessel tortuosity, and acoustic shadowing. Findings are consistent with a 1-39 percent stenosis involving the right internal carotid artery and the left internal carotid artery. The vertebral arteries demonstrate antegrade flow.   PHYSICAL EXAM  Temp:  [97.6 F (36.4 C)-99.3 F (37.4 C)] 99.1 F (37.3 C) (09/27 0729) Pulse Rate:  [70-111] 80 (09/27 0932) Resp:  [15-25] 15 (09/27 0729) BP: (137-170)/(80-101) 150/80 (09/27 0729) SpO2:  [97 %-99 %] 97 % (09/27 0729)  General - Well nourished, well developed, in no apparent distress.  Ophthalmologic - Fundi not visualized due to noncooperation.  Cardiovascular - irregularly irregular heart rate and rhythm.  Mental Status -  Level of arousal and orientation to time, place, and person were intact. Language showed able to have short sentences of spontaneous speech, able to follow all commands, name 2/2 and able to repeat, however, pt has severe dysarthria and left neglect.  Cranial Nerves II - XII - II - Visual field showed left visual field neglect. III, IV, VI - right gaze preference V - Facial sensation intact bilaterally. VII - left facial droop. VIII - Hearing & vestibular intact bilaterally. X - Palate elevates symmetrically. XI - Chin turning & shoulder shrug intact bilaterally. XII - Tongue protrusion to the left.  Motor Strength - The patient's strength was 0/5 LUE and LLE, 5/5 RUE and 3/5 RLE due to general letheragy.  Bulk was normal and fasciculations were absent.   Motor Tone - Muscle tone was assessed at the neck and appendages and was normal.  Reflexes - The patient's reflexes were 1+ in all extremities and she had left babinski.  Sensory - Light touch, temperature/pinprick were assessed and were  symmetrical.    Coordination - The patient had normal movements in the right hand with no ataxia or dysmetria.  Tremor was absent.  Gait and Station - not tested.    ASSESSMENT/PLAN [name] is a 75 y.o. female with history of HTN presenting with right gaze and left hemiplegia. She received IV t-PA 07/28/2016 at 2106. Taken to intervention, where she received near complete revascularization of occluded superior right MCA with Solitaire and intra-arterial Integrilin with TICI 2B revascularization.  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 Speaks in Hebrew. Writes in [place].  CT head right MCA hyperdense sign  MRI Left MCA infarct. No visible hemorrhagic transformation.  Cerebral angio - occlusion of superior division of RT MCA s/p TICI2b revascularization   2D  Echo EF 60-65%  CUS No significant stenosis   LDL 90  HgbA1c 5.6  SCDs for VTE prophylaxis DIET - DYS 1 Room service appropriate? Yes; Fluid consistency: Thin  No antithrombotic prior to admission, now on ASA 325 mg daily. Will need anticoagulation in 7-10 days for stroke prevention  Ongoing aggressive stroke risk factor management  Therapy recommendations:  CIR recommended but no primary caregiver at home -> SNF  Disposition - pending medically ready for discharge once bed available. (was primary caregiver of her husband who has had a stroke PTA)  Atrial Fibrillation/Flutter with RVR new onset  New onset with heart rates elevated, confirmed with EKG   Home anticoagulation:  None  CHA2DS2-VASc Score = 6, ?2 oral anticoagulation recommended  Age in Years:  ?70   +2    Sex:  Female   Female   +1    Hypertension History:  yes   +1     Diabetes Mellitus:  0   Congestive Heart Failure History:   0  Vascular Disease History:  0   Stroke/TIA/Thromboembolism History:  yes   +2  Went into AF w/ RVR and placed on cardizem drip for a short while and transitioned to BB. Now in NSR. Had SVT during transition.   Metoprolol 25 mg qid   Cardiology on board  start anticoagulation in 7-10 days given size of stroke  Hypertensive Emergency  BP lowering, 122/80-170/101  Initially treated with IV hydralazine and Cardene, now off drip  On metoprolol po  BP goal normotensive in 3-5 days  Hyperlipidemia  Home meds:  No statin  LDL 90, goal < 70  On lipitor 20  Continue statin on discharge.  Depression  Lost mom recently  Crying episode  Added Zoloft 50 mg Qhs   Other Stroke Risk Factors  Obesity, Body mass index is 28.52 kg/m., recommend weight loss, diet and exercise as appropriate   Other Active Problems  Hypokalemia, resolved after supplement  Hospital day # 6  Marvel Plan, MD PhD Stroke Neurology 08/04/2016 11:25 PM    To contact Stroke Continuity provider, please refer to [email or web address]. After hours, contact General Neurology

## 2016-08-04 NOTE — NC FL2 (Signed)
[place] MEDICAID FL2 LEVEL OF CARE SCREENING TOOL     IDENTIFICATION  Patient Name: Diana ColonelMary Umphlett Birthdate: 11/23/1940 Sex: female Admission Date (Current Location): 07/28/2016  [facility] [place]   Facility and Address:  [facility], [address], [place], [place] [address]      Provider Number: [id number]  Attending Physician Name and Address:  Marvel PlanJindong Xu, MD  Relative Name and Phone Number:  Gerilyn NestleRebecca Stopper (Daughter) [phone]    Current Level of Care: Hospital Recommended Level of Care: Skilled Nursing Facility Prior Approval Number:    Date Approved/Denied:   PASRR Number: [id number]  Discharge Plan: SNF    Current Diagnoses: Patient Active Problem List   Diagnosis Date Noted  . Hypertensive emergency 08/04/2016  . Hypokalemia 08/04/2016  . Depression   . Cerebrovascular accident (CVA) due to thrombosis of right middle cerebral artery (HCC) s/p mechanical thrombectomy   . Hemiplegia affecting left nondominant side (HCC)   . HLD (hyperlipidemia)   . Dysphagia, post-stroke   . Dysarthria, post-stroke   . Expressive aphasia   . Acute blood loss anemia   . Benign essential HTN   . Atrial fibrillation with RVR (HCC) 07/29/2016    Orientation RESPIRATION BLADDER Height & Weight     Self, Time, Situation, Place  Normal Incontinent Weight: 171 lb 6.4 oz (77.7 kg) Height:  5\' 5"  (165.1 cm)  BEHAVIORAL SYMPTOMS/MOOD NEUROLOGICAL BOWEL NUTRITION STATUS   (NONE)  (NONE) Incontinent Diet  AMBULATORY STATUS COMMUNICATION OF NEEDS Skin   Extensive Assist Verbally Normal                       Personal Care Assistance Level of Assistance  Bathing, Dressing, Feeding Bathing Assistance: Maximum assistance Feeding assistance: Independent Dressing Assistance: Maximum assistance     Functional Limitations Info  Sight, Hearing, Speech Sight Info: Impaired Hearing Info: Adequate Speech Info: Impaired     SPECIAL CARE FACTORS FREQUENCY  PT (By licensed PT)     PT Frequency: Min 4X/week              Contractures Contractures Info: Not present    Additional Factors Info  Allergies, Code Status, Psychotropic Code Status Info: FULL Allergies Info: No Known Allergies Psychotropic Info: Zoloft         Current Medications (08/04/2016):  This is the current hospital active medication list Current Facility-Administered Medications  Medication Dose Route Frequency Provider Last Rate Last Dose  . acetaminophen (TYLENOL) tablet 650 mg  650 mg Oral Q4H PRN Noel Christmasharles Stewart   650 mg at 08/03/16 2102  . aspirin EC tablet 325 mg  325 mg Oral Daily David L Rinehuls, PA-C   325 mg at 08/04/16 0933  . atorvastatin (LIPITOR) tablet 20 mg  20 mg Oral q1800 Marvel PlanJindong Xu, MD   20 mg at 08/03/16 1800  . butalbital-acetaminophen-caffeine (FIORICET, ESGIC) 50-325-40 MG per tablet 1-2 tablet  1-2 tablet Oral Q6H PRN Kinnie Scalesavid L Rinehuls, PA-C   2 tablet at 08/03/16 0234  . chlorhexidine (PERIDEX) 0.12 % solution 15 mL  15 mL Mouth/Throat BID Anson FretAntonia B Ahern, MD   15 mL at 08/04/16 0933  . digoxin (LANOXIN) tablet 0.25 mg  0.25 mg Oral Daily Mihai Croitoru, MD   0.25 mg at 08/04/16 0932  . enoxaparin (LOVENOX) injection 40 mg  40 mg Subcutaneous Q24H Marvel PlanJindong Xu, MD   40 mg at 08/03/16 1347  . metoprolol tartrate (LOPRESSOR) tablet 25 mg  25 mg Oral QID Rollene Rotunda, MD   25 mg at 08/04/16 0932  . pantoprazole (PROTONIX) EC tablet 40 mg  40 mg Oral Daily Marvel Plan, MD   40 mg at 08/04/16 0932  . sertraline (ZOLOFT) tablet 50 mg  50 mg Oral QHS Marvel Plan, MD   50 mg at 08/03/16 2101     Discharge Medications: Please see discharge summary for a list of discharge medications.  Relevant Imaging Results:  Relevant Lab Results:   Additional Information SS #[id number]  [place]well [place], LCSW

## 2016-08-04 NOTE — Progress Notes (Signed)
SW spoke with daughter and informed her of bed choices. [facility] SW informed her that [facility] [employer]. SW made nurse aware.   SW spoke with facility/Rhonda who states pt can come now.   Report [phone]  Crista CurbBrittney Chandria Rookstool, MSW [phone]

## 2016-08-04 NOTE — Discharge Summary (Signed)
Stroke Discharge Summary  Patient ID: Diana Bush   MRN: [id number]      DOB: 05/27/1941  Date of Admission: 07/28/2016 Date of Discharge: 08/04/2016  Attending Physician:  Marvel Plan, MD, Stroke MD Consulting Physician(s):   Raelyn Ensign, MD (Interventional Neuroradiologist), Lawanda Cousins, MD (critical care), Rollene Rotunda, MD (cardiology), Claudette Laws, MD (Physical Medicine & Rehabtilitation)  Patient's PCP:  No primary care provider on file.  DISCHARGE DIAGNOSIS:  Principal Problem:   Cerebrovascular accident (CVA) due to thrombosis of right middle cerebral artery (HCC) s/p mechanical thrombectomy Active Problems:   Atrial fibrillation with RVR (HCC)   Hemiplegia affecting left nondominant side (HCC)   HLD (hyperlipidemia)   Dysphagia, post-stroke   Dysarthria, post-stroke   Expressive aphasia   Acute blood loss anemia   Benign essential HTN   Depression   Hypertensive emergency   Hypokalemia  Past Medical History:  Diagnosis Date  . Hypertension   . Osteoarthritis   . PAF (paroxysmal atrial fibrillation) (HCC)    Past Surgical History:  Procedure Laterality Date  . IR GENERIC HISTORICAL  07/28/2016   IR PERCUTANEOUS ART THROMBECTOMY/INFUSION INTRACRANIAL INC DIAG ANGIO 07/28/2016 Julieanne Cotton, MD [facility]-INTER[facility] RAD  . RADIOLOGY WITH ANESTHESIA N/A 07/28/2016   Procedure: RADIOLOGY WITH ANESTHESIA;  Surgeon: Julieanne Cotton, MD;  Location: [facility] OR;  Service: Radiology;  Laterality: N/A;      Medication List    TAKE these medications   aspirin 325 MG EC tablet Take 1 tablet (325 mg total) by mouth daily. Start taking on:  08/05/2016   atorvastatin 20 MG tablet Commonly known as:  LIPITOR Take 1 tablet (20 mg total) by mouth daily at 6 PM.   digoxin 0.25 MG tablet Commonly known as:  LANOXIN Take 1 tablet (0.25 mg total) by mouth daily. Start taking on:  08/05/2016   metoprolol tartrate 25 MG tablet Commonly known as:  LOPRESSOR Take 1  tablet (25 mg total) by mouth 4 (four) times daily.   sertraline 50 MG tablet Commonly known as:  ZOLOFT Take 1 tablet (50 mg total) by mouth at bedtime.       LABORATORY STUDIES CBC    Component Value Date/Time   WBC 5.0 08/04/2016 0409   RBC 3.89 08/04/2016 0409   HGB 10.3 (L) 08/04/2016 0409   HCT 33.8 (L) 08/04/2016 0409   PLT 215 08/04/2016 0409   MCV 86.9 08/04/2016 0409   MCH 26.5 08/04/2016 0409   MCHC 30.5 08/04/2016 0409   RDW 14.4 08/04/2016 0409   LYMPHSABS 1.3 08/04/2016 0409   MONOABS 0.7 08/04/2016 0409   EOSABS 0.2 08/04/2016 0409   BASOSABS 0.0 08/04/2016 0409   CMP    Component Value Date/Time   NA 136 08/02/2016 0308   K 3.7 08/02/2016 0308   CL 105 08/02/2016 0308   CO2 24 08/02/2016 0308   GLUCOSE 93 08/02/2016 0308   BUN 8 08/02/2016 0308   CREATININE 0.55 08/02/2016 0308   CALCIUM 9.0 08/02/2016 0308   PROT 7.0 07/28/2016 2034   ALBUMIN 3.8 07/28/2016 2034   AST 25 07/28/2016 2034   ALT 42 07/28/2016 2034   ALKPHOS 68 07/28/2016 2034   BILITOT 0.7 07/28/2016 2034   GFRNONAA >60 08/02/2016 0308   GFRAA >60 08/02/2016 0308   COAGS Lab Results  Component Value Date   INR 1.15 07/28/2016   Lipid Panel    Component Value Date/Time   CHOL 143 07/29/2016 0500   TRIG 69 07/31/2016  0149   HDL 43 07/29/2016 0500   CHOLHDL 3.3 07/29/2016 0500   VLDL 10 07/29/2016 0500   LDLCALC 90 07/29/2016 0500   HgbA1C  Lab Results  Component Value Date   HGBA1C 5.6 07/29/2016   Urinalysis    Component Value Date/Time   COLORURINE YELLOW 07/28/2016 2034   APPEARANCEUR CLEAR 07/28/2016 2034   LABSPEC 1.020 07/28/2016 2034   PHURINE 6.0 07/28/2016 2034   GLUCOSEU NEGATIVE 07/28/2016 2034   HGBUR NEGATIVE 07/28/2016 2034   BILIRUBINUR NEGATIVE 07/28/2016 2034   KETONESUR NEGATIVE 07/28/2016 2034   PROTEINUR NEGATIVE 07/28/2016 2034   NITRITE NEGATIVE 07/28/2016 2034   LEUKOCYTESUR NEGATIVE 07/28/2016 2034   Urine Drug Screen     Component  Value Date/Time   LABOPIA NONE DETECTED 07/28/2016 2156   COCAINSCRNUR NONE DETECTED 07/28/2016 2156   LABBENZ NONE DETECTED 07/28/2016 2156   AMPHETMU NONE DETECTED 07/28/2016 2156   THCU NONE DETECTED 07/28/2016 2156   LABBARB NONE DETECTED 07/28/2016 2156    Alcohol Level    Component Value Date/Time   [facility] <5 07/28/2016 2034     SIGNIFICANT DIAGNOSTIC STUDIES I have personally reviewed the radiological images below and agree with the radiology interpretations.  MRI of the brain  07/29/2016 Significant progression of acute infarction compared with the preoperative code stroke CT, now with involvement of the insula,posterior frontal and anterior parietal cortex and regional white matter. No visible hemorrhagic transformation.  Ct Head Code Stroke W/o Cm 07/28/2016 1. Acute RIGHT MCA territory nonhemorrhagic infarct with dense RIGHT MCA. Otherwise negative CT HEAD for age.  2. ASPECTS is 8.   Cerebral angiogram 07/28/2016  S/P RT common carotid arteriogram,followed by near complete revascularization of occlded superior division of RT MCA with x 1pass with solitaire 4 mm x 40 mm FR retrieval device and 1.5 mg of superselective Intraarterial intracranial INTEGRELIN achieving a TICI 2b reperfusion.  Ct Head post IR 07/29/2016 1. No complication identified status post recent catheter directed therapy for right MCA embolism.  2. Evolving hypodensity within the right insular cortex, compatible with evolving ischemia.   TTE  - Left ventricle: The cavity size was normal. There was moderateconcentric hypertrophy. Systolic function was vigorous. Theestimated ejection fraction was in the range of 65% to 70%. Wallmotion was normal; there were no regional wall motionabnormalities. The study was not technically sufficient to allowevaluation of LV diastolic dysfunction due to atrialfibrillation. - Aortic valve: Trileaflet; mildly thickened, mildly calcifiedleaflets. There was mild  regurgitation. - Aortic root: The aortic root was normal in size. - Mitral valve: Calcified annulus. Mildly thickened leaflets.There was no regurgitation. - Left atrium: The atrium was normal in size. - Right ventricle: Systolic function was normal. - Right atrium: The atrium was mildly dilated. - Tricuspid valve: There was moderate regurgitation. - Pulmonic valve: There was mild regurgitation. - Pulmonary arteries: The main pulmonary artery was normal-sized.Systolic pressure was moderately increased. PA peak pressure: 48mm Hg (S). - Inferior vena cava: The vessel was dilated. The respirophasicdiameter changes were blunted (<50%), consistent with elevatedcentral venous pressure. - Pericardium, extracardiac: There was no pericardial effusion.  CUS Technically difficult study due to patient movement, cooperation, vessel tortuosity, and acoustic shadowing. Findings are consistent with a 1-39 percent stenosis involving the right internal carotid artery and the left internal carotid artery. The vertebral arteries demonstrate antegrade flow.     HISTORY OF PRESENT ILLNESS Leetta Hendriks an 75 y.o.femalewith a history of hypertension brought to the emergency room and code stroke status following acute onset  of speech, right gaze and left hemiplegia. Symptoms occurred at 8:00 PM tonight 07/28/2016 ([facility]). She has no previous history of stroke nor TIA. CT scan of her head showed a small area of early infarction involving left MCA territory, as well as acute left MCA M1 thrombus. Patient was deemed a candidate for TPA which was administered IV. No significant change and clinical deficits was seen. Interventional radiology was consulted for further management. Blood pressure was elevated on initial presentation which required IV hydralazine for immediate management. NIH stroke score was 21. Patient was taken to IR where she received received near complete revascularization of occluded superior right  MCA with Solitaire and intra-arterial Integrilin with TICI 2B reperfusion. She was admitted to the neuro ICU for further evaluation and treatment.     HOSPITAL COURSE Ms. Janann ColonelMary Woodlief is a 75 y.o. female with history of HTN presenting with right gaze and left hemiplegia. She received IV t-PA 07/28/2016 at 2106. Taken to intervention, where she received near complete revascularization of occluded superior right MCA with Solitaire and intra-arterial Integrilin with TICI 2B revascularization. Initial hypertension treated with cardene. During hospitalization, she developed new onset atrial fibrillation which was felt to be the source of her stroke. Given large size of stroke, will defer anticoagulation for secondary stroke prevention until 10 days post stroke, will use DOAC. Cardiology consulted to help with atrial fibrillation management and they agree. Statin was added as was zoloft for situational depression with bouts of crying. CIR recommended, but given no home care available, will be discharged to SNF.   Stroke:  right MCA infarct s/p IV tPA and thrombectomy with TICI2b revascularization of R MCA, embolic secondary to newly diagnosed atrial fibrillation   Resultant  left hemiplegia, left neglect, dysarthria  CT head right MCA hyperdense sign  MRI Left MCA infarct. No visible hemorrhagic transformation.  Cerebral angio - occlusion of superior division of RT MCA s/p TICI2b revascularization   2D Echo EF 60-65%  CUS No significant stenosis   LDL 90  HgbA1c 5.6  No antithrombotic prior to admission, now on aspirin 325 mg daily. Will need anticoagulation with DOAC 10 days post stroke for stroke prevention. Please start eliquis 5mg  bid on 08/07/16 and discontinue ASA at that time  Ongoing aggressive stroke risk factor management  Therapy recommendations:  CIR recommended but no primary caregiver at home -> SNF  Disposition:  SNF (was primary caregiver of her husband who has had a stroke  PTA)  Atrial Fibrillation/Flutter with RVR new onset  New onset with heart rates elevated, confirmed with EKG   Home anticoagulation:  None  CHA2DS2-VASc Score = 6, ?2 oral anticoagulation recommended             Age in Years:  ?[age]   +2                        Sex:  Female   Female   +1                      Hypertension History:  yes   +1                        Diabetes Mellitus:  0              Congestive Heart Failure History:  0             Vascular Disease History:  0  Stroke/TIA/Thromboembolism History:  yes   +2  Went into AF w/ RVR and placed on cardizem drip for a short while and transitioned to BB. Now in NSR. Had SVT during transition.   Metoprolol 25 mg qid   Cardiology consulted  start anticoagulation with DOAC 10 days post stroke for secondary stroke prevention. Please start eliquis 5mg  bid for stroke prevention on 08/07/16 and discontinue ASA at that time.   Hypertensive Emergency  Initially treated with IV hydralazine and Cardene, now off drip  Normalized in hospital  On metoprolol po  BP goal normotensive   Hyperlipidemia  Home meds:  No statin  LDL 90, goal < 70  On lipitor 20  Continue statin on discharge.  Depression  Lost mom recently  Crying episode  Added Zoloft 50 mg Qhs   Other Stroke Risk Factors  Overweight, Body mass index is 28.52 kg/m., recommend weight loss, diet and exercise as appropriate   Other Active Problems  Hypokalemia, resolved after supplement   DISCHARGE EXAM Blood pressure 136/72, pulse 67, temperature 97.7 F (36.5 C), temperature source Oral, resp. rate 15, height 5\' 5"  (1.651 m), weight 171 lb 6.4 oz (77.7 kg), SpO2 97 %. General - Well nourished, well developed, in no apparent distress.  Ophthalmologic - Fundi not visualized due to noncooperation.  Cardiovascular - irregularly irregular heart rate and rhythm.  Mental Status -  Level of arousal and orientation to time, place, and  person were intact. Language showed able to have short sentences of spontaneous speech, able to follow all commands, name 2/2 and able to repeat, however, pt has severe dysarthria and left neglect.  Cranial Nerves II - XII - II - Visual field showed left visual field neglect. III, IV, VI - right gaze preference V - Facial sensation intact bilaterally. VII - left facial droop. VIII - Hearing & vestibular intact bilaterally. X - Palate elevates symmetrically. XI - Chin turning & shoulder shrug intact bilaterally. XII - Tongue protrusion to the left.  Motor Strength - The patient's strength was 0/5 LUE and LLE, 5/5 RUE and 3/5 RLE due to general letheragy.  Bulk was normal and fasciculations were absent.   Motor Tone - Muscle tone was assessed at the neck and appendages and was normal.  Reflexes - The patient's reflexes were 1+ in all extremities and she had left babinski.  Sensory - Light touch, temperature/pinprick were assessed and were symmetrical.    Coordination - The patient had normal movements in the right hand with no ataxia or dysmetria.  Tremor was absent.  Gait and Station - not tested.  Discharge Diet   DIET - DYS 1 Room service appropriate? Yes; Fluid consistency: Thin liquids  DISCHARGE PLAN  Disposition:  SNF  aspirin 325 mg daily for secondary stroke prevention. Please start eliquis 5mg  bid for stroke prevention on 08/07/16 and discontinue ASA.   Ongoing risk factor control by Primary Care Physician at time of discharge  Follow-up with PCP in 2 weeks after SNF discharge.  Follow-up with Dr. Marvel Plan, Stroke Clinic in 6 weeks, office to schedule an appointment.  45 minutes were spent preparing discharge.  Marvel Plan, MD PhD Stroke Neurology 08/04/2016 4:27 PM

## 2016-08-05 ENCOUNTER — Encounter (HOSPITAL_COMMUNITY): Payer: Self-pay | Admitting: Orthopaedic Surgery

## 2016-08-05 ENCOUNTER — Encounter: Payer: Self-pay | Admitting: Internal Medicine

## 2016-08-05 ENCOUNTER — Non-Acute Institutional Stay (SKILLED_NURSING_FACILITY): Payer: Medicare Other | Admitting: Internal Medicine

## 2016-08-05 DIAGNOSIS — F329 Major depressive disorder, single episode, unspecified: Secondary | ICD-10-CM | POA: Diagnosis not present

## 2016-08-05 DIAGNOSIS — F32A Depression, unspecified: Secondary | ICD-10-CM

## 2016-08-05 DIAGNOSIS — I10 Essential (primary) hypertension: Secondary | ICD-10-CM

## 2016-08-05 DIAGNOSIS — I4891 Unspecified atrial fibrillation: Secondary | ICD-10-CM

## 2016-08-05 DIAGNOSIS — E785 Hyperlipidemia, unspecified: Secondary | ICD-10-CM

## 2016-08-05 DIAGNOSIS — I63311 Cerebral infarction due to thrombosis of right middle cerebral artery: Secondary | ICD-10-CM

## 2016-08-05 DIAGNOSIS — M542 Cervicalgia: Secondary | ICD-10-CM

## 2016-08-05 NOTE — Assessment & Plan Note (Signed)
Repeat fasting lipids with LFTs after 6 weeks of atorvastatin therapy

## 2016-08-05 NOTE — Progress Notes (Signed)
Facility Location: [facility]  Room Number: 308-A  Code Status: Full Code   PCP: Dorrene German, MD [address] [place] [address]    This is a comprehensive admission note to [facility] performed on this date less than 30 days from date of admission. Included are preadmission medical/surgical history;reconciled medication list; family history; social history and comprehensive review of systems.  Corrections and additions to the records were documented . Comprehensive physical exam was also performed. Additionally a clinical summary was entered for each active diagnosis pertinent to this admission in the Problem List to enhance continuity of care.   HPI: The patient was hospitalized 9/20-9/27/17 with CVA due to thrombosis of the right middle cerebral artery for which she had mechanical thrombectomy. This was in the context of atrial fibrillation with rapid ventricular response. Stroke was complicated by hemiplegia affecting the left nondominant side. She's had dysarthria , dysphagia and expressive aphasia  following the stroke . She did have acute blood loss anemia. She was discharged on aspirin EC 325 mg daily and generic Lipitor 20 mg daily. She was prescribed metoprolol 25 mg 4 times a day. CHADs score was calculated as 6. Oral anticoagulant was recommended to be initiated with Ellquis 5 mg twice a day beginning 9/30 with discontinuation of the full dose aspirin at that time. Pertinent results of radiographic studies and labs were entered in the problem list.  Past medical and surgical history: Includes paroxysmal A fibrillation, degenerative joint disease, hypertension, reflux, borderline dyslipidemia, and some sleep disorder. Her daughter also describes anxiety in the context of recent family death.  Social history: She does not drink or smoke.  Family history: No history in Epic. History updated based on information from her  daughter, [name].  Review of systems: It was difficult to understand responses due to expressive aphasia and dysarthria. She indicates that she has discomfort in her neck and occiput .according to her daughter the attendings while she was hospitalized were aware of this. No specific therapy prescribed at discharge. Daughter states that she is very anxious in  that she's been the primary care provider for her husband who has had a stroke. They've been married 53 years. Additionally her mother died of colon cancer 2 weeks ago, a source of stress. She become even more emotionally labile with the stroke. Sertraline was initiated in the hospital. Daughter states she she has GERD as well as some chronic sinus problems and vertigo. The vertigo has caused some imbalance. The remainder of the review of systems is negative. Constitutional: No fever,significant weight change, fatigue  Eyes: No redness, discharge, pain, vision change ENT/mouth: No nasal congestion,  purulent discharge, earache,change in hearing ,sore throat  Cardiovascular: No chest pain, palpitations,paroxysmal nocturnal dyspnea, claudication, edema  Respiratory: No cough, sputum production,hemoptysis, DOE , significant snoring,apnea   Gastrointestinal: No abdominal pain, nausea / vomiting,rectal bleeding, melena,change in bowels Genitourinary: No dysuria,hematuria, pyuria,  incontinence, nocturia Musculoskeletal: Osteoarthritis by history  Dermatologic: No rash, pruritus, change in appearance of skin Endocrine: No change in hair/skin/ nails, excessive thirst, excessive hunger, excessive urination  Hematologic/lymphatic: No significant bruising, lymphadenopathy,abnormal bleeding Allergy/immunology: No itchy/ watery eyes, significant sneezing, urticaria, angioedema  Physical exam:  Pertinent or positive findings: The patient does exhibit some emotional lability. She repeated complaints of pain in the neck. She began to laugh inappropriately  as I examined her abdomen and lower extremities. The daughter explained that her mother was afraid that I was looking at "my stuff".  Teeth reveal fair hygiene with some plaque. Heart rate is slow and irregular. She has fusiform enlargement of the knees and crepitus. Dorsalis pedis pulses are decreased. She has good range of motion of the neck. There is slight rigidity without frank meningismus. She does not move her chin to the left shoulder when instructed to do so. She can touch her chin to the anterior chest intermittently. She is unable to raise her left upper or lower extremities.  General appearance:Adequately nourished; no acute distress , increased work of breathing is present.   Lymphatic: No lymphadenopathy about the head, neck, axilla . Eyes: No conjunctival inflammation or lid edema is present. There is no scleral icterus. Ears:  External ear exam shows no significant lesions or deformities.   Nose:  External nasal examination shows no deformity or inflammation. Nasal mucosa are pink and moist without lesions ,exudates Oral exam: lips and gums are healthy appearing.There is no oropharyngeal erythema or exudate . Neck:  No thyromegaly, masses, tenderness noted.    Heart:  No  gallop, murmur, click, rub .  Lungs:Chest clear to auscultation without wheezes, rhonchi,rales , rubs. Abdomen:Bowel sounds are normal. Abdomen is soft and nontender with no organomegaly, hernias,masses. GU: deferred  Extremities:  No cyanosis, clubbing,edema  Neurologic exam : Strength unequal  in upper & lower extremities Balance,Rhomberg,finger to nose testing could not be completed due to clinical state Skin: Warm & dry w/o tenting. No significant lesions or rash.  See clinical summary under each active problem in the Problem List with associated updated therapeutic plan

## 2016-08-05 NOTE — Assessment & Plan Note (Signed)
Enteric-coated 325 mg aspirin.  Eliquis 5 mg twice a day beginning 08/07/16 with discontinuation of aspirin

## 2016-08-05 NOTE — Assessment & Plan Note (Signed)
Assess response to sertraline with titration of the dose as clinically indicated

## 2016-08-05 NOTE — Assessment & Plan Note (Signed)
Blood pressure control is adequate despite agitation and subjective occipital and neck pain

## 2016-08-05 NOTE — Patient Instructions (Signed)
Her daughter, Lurena JoinerRebecca indicates she wants to transfer her mother to [facility]. I'll sign orders for such when informed that [facility] will accept her.  Check digoxin level.

## 2016-08-05 NOTE — Assessment & Plan Note (Signed)
Digoxin level in a.m.

## 2016-08-06 DIAGNOSIS — M1990 Unspecified osteoarthritis, unspecified site: Secondary | ICD-10-CM | POA: Diagnosis not present

## 2016-08-06 DIAGNOSIS — D500 Iron deficiency anemia secondary to blood loss (chronic): Secondary | ICD-10-CM | POA: Diagnosis not present

## 2016-08-06 DIAGNOSIS — R531 Weakness: Secondary | ICD-10-CM | POA: Diagnosis not present

## 2016-08-06 DIAGNOSIS — G709 Myoneural disorder, unspecified: Secondary | ICD-10-CM | POA: Diagnosis not present

## 2016-08-06 DIAGNOSIS — R1310 Dysphagia, unspecified: Secondary | ICD-10-CM | POA: Diagnosis not present

## 2016-08-06 DIAGNOSIS — G8929 Other chronic pain: Secondary | ICD-10-CM | POA: Diagnosis not present

## 2016-08-06 DIAGNOSIS — D649 Anemia, unspecified: Secondary | ICD-10-CM | POA: Diagnosis not present

## 2016-08-06 DIAGNOSIS — I639 Cerebral infarction, unspecified: Secondary | ICD-10-CM | POA: Diagnosis not present

## 2016-08-06 DIAGNOSIS — R278 Other lack of coordination: Secondary | ICD-10-CM | POA: Diagnosis not present

## 2016-08-06 DIAGNOSIS — I8290 Acute embolism and thrombosis of unspecified vein: Secondary | ICD-10-CM | POA: Diagnosis not present

## 2016-08-06 DIAGNOSIS — M542 Cervicalgia: Secondary | ICD-10-CM | POA: Diagnosis not present

## 2016-08-06 DIAGNOSIS — I69954 Hemiplegia and hemiparesis following unspecified cerebrovascular disease affecting left non-dominant side: Secondary | ICD-10-CM | POA: Diagnosis not present

## 2016-08-06 DIAGNOSIS — I82622 Acute embolism and thrombosis of deep veins of left upper extremity: Secondary | ICD-10-CM | POA: Diagnosis not present

## 2016-08-06 DIAGNOSIS — I10 Essential (primary) hypertension: Secondary | ICD-10-CM | POA: Diagnosis not present

## 2016-08-06 DIAGNOSIS — I4891 Unspecified atrial fibrillation: Secondary | ICD-10-CM | POA: Diagnosis not present

## 2016-08-06 DIAGNOSIS — M62838 Other muscle spasm: Secondary | ICD-10-CM | POA: Diagnosis not present

## 2016-08-06 DIAGNOSIS — F329 Major depressive disorder, single episode, unspecified: Secondary | ICD-10-CM | POA: Diagnosis not present

## 2016-08-06 DIAGNOSIS — F322 Major depressive disorder, single episode, severe without psychotic features: Secondary | ICD-10-CM | POA: Diagnosis not present

## 2016-08-06 DIAGNOSIS — Z8673 Personal history of transient ischemic attack (TIA), and cerebral infarction without residual deficits: Secondary | ICD-10-CM | POA: Diagnosis not present

## 2016-08-06 DIAGNOSIS — G8104 Flaccid hemiplegia affecting left nondominant side: Secondary | ICD-10-CM | POA: Diagnosis not present

## 2016-08-06 DIAGNOSIS — H2513 Age-related nuclear cataract, bilateral: Secondary | ICD-10-CM | POA: Diagnosis not present

## 2016-08-06 DIAGNOSIS — E785 Hyperlipidemia, unspecified: Secondary | ICD-10-CM | POA: Diagnosis not present

## 2016-08-06 DIAGNOSIS — F339 Major depressive disorder, recurrent, unspecified: Secondary | ICD-10-CM | POA: Diagnosis not present

## 2016-08-06 DIAGNOSIS — M6281 Muscle weakness (generalized): Secondary | ICD-10-CM | POA: Diagnosis not present

## 2016-08-06 DIAGNOSIS — Z5181 Encounter for therapeutic drug level monitoring: Secondary | ICD-10-CM | POA: Diagnosis not present

## 2016-08-06 DIAGNOSIS — R4701 Aphasia: Secondary | ICD-10-CM | POA: Diagnosis not present

## 2016-08-06 DIAGNOSIS — M62830 Muscle spasm of back: Secondary | ICD-10-CM | POA: Diagnosis not present

## 2016-08-06 DIAGNOSIS — R1311 Dysphagia, oral phase: Secondary | ICD-10-CM | POA: Diagnosis not present

## 2016-08-06 DIAGNOSIS — Z7901 Long term (current) use of anticoagulants: Secondary | ICD-10-CM | POA: Diagnosis not present

## 2016-08-06 DIAGNOSIS — R2689 Other abnormalities of gait and mobility: Secondary | ICD-10-CM | POA: Diagnosis not present

## 2016-08-06 DIAGNOSIS — I63311 Cerebral infarction due to thrombosis of right middle cerebral artery: Secondary | ICD-10-CM | POA: Diagnosis not present

## 2016-08-06 DIAGNOSIS — Z96641 Presence of right artificial hip joint: Secondary | ICD-10-CM | POA: Diagnosis not present

## 2016-08-10 DIAGNOSIS — I4891 Unspecified atrial fibrillation: Secondary | ICD-10-CM | POA: Diagnosis not present

## 2016-08-10 DIAGNOSIS — R1310 Dysphagia, unspecified: Secondary | ICD-10-CM | POA: Diagnosis not present

## 2016-08-10 DIAGNOSIS — I639 Cerebral infarction, unspecified: Secondary | ICD-10-CM | POA: Diagnosis not present

## 2016-08-10 DIAGNOSIS — F329 Major depressive disorder, single episode, unspecified: Secondary | ICD-10-CM | POA: Diagnosis not present

## 2016-08-13 DIAGNOSIS — I10 Essential (primary) hypertension: Secondary | ICD-10-CM | POA: Diagnosis not present

## 2016-08-13 DIAGNOSIS — D649 Anemia, unspecified: Secondary | ICD-10-CM | POA: Diagnosis not present

## 2016-08-13 DIAGNOSIS — R1310 Dysphagia, unspecified: Secondary | ICD-10-CM | POA: Diagnosis not present

## 2016-08-13 DIAGNOSIS — I639 Cerebral infarction, unspecified: Secondary | ICD-10-CM | POA: Diagnosis not present

## 2016-08-13 DIAGNOSIS — M1990 Unspecified osteoarthritis, unspecified site: Secondary | ICD-10-CM | POA: Diagnosis not present

## 2016-08-13 DIAGNOSIS — I4891 Unspecified atrial fibrillation: Secondary | ICD-10-CM | POA: Diagnosis not present

## 2016-08-18 ENCOUNTER — Encounter (HOSPITAL_COMMUNITY): Payer: Self-pay

## 2016-08-18 ENCOUNTER — Emergency Department (HOSPITAL_COMMUNITY)
Admission: EM | Admit: 2016-08-18 | Discharge: 2016-08-19 | Disposition: A | Discharge status: Home / Self Care | Payer: Medicare Other | Attending: Emergency Medicine | Admitting: Emergency Medicine

## 2016-08-18 DIAGNOSIS — Z8673 Personal history of transient ischemic attack (TIA), and cerebral infarction without residual deficits: Secondary | ICD-10-CM | POA: Insufficient documentation

## 2016-08-18 DIAGNOSIS — I10 Essential (primary) hypertension: Secondary | ICD-10-CM | POA: Diagnosis not present

## 2016-08-18 DIAGNOSIS — Z7901 Long term (current) use of anticoagulants: Secondary | ICD-10-CM | POA: Insufficient documentation

## 2016-08-18 DIAGNOSIS — M62830 Muscle spasm of back: Secondary | ICD-10-CM | POA: Diagnosis not present

## 2016-08-18 DIAGNOSIS — M542 Cervicalgia: Secondary | ICD-10-CM | POA: Diagnosis present

## 2016-08-18 DIAGNOSIS — M62838 Other muscle spasm: Secondary | ICD-10-CM | POA: Insufficient documentation

## 2016-08-18 HISTORY — DX: Cerebral infarction, unspecified: I63.9

## 2016-08-18 MED ORDER — DIAZEPAM 5 MG PO TABS
5.0000 mg | ORAL_TABLET | Freq: Once | ORAL | Status: AC
Start: 2016-08-18 — End: 2016-08-18
  Administered 2016-08-18: 5 mg via ORAL
  Filled 2016-08-18: qty 1

## 2016-08-18 MED ORDER — HYDROCODONE-ACETAMINOPHEN 5-325 MG PO TABS
1.0000 | ORAL_TABLET | Freq: Once | ORAL | Status: AC
  Administered 2016-08-18: 1 via ORAL
  Filled 2016-08-18: qty 1

## 2016-08-18 NOTE — ED Provider Notes (Signed)
TIME SEEN: 11:31  CHIEF COMPLAINT: neck pain  By signing my name below, I, Christy Sartorius, attest that this documentation has been prepared under the direction and in the presence of [employer], DO . Electronically Signed: Christy Sartorius, Scribe. 08/19/2016. 12:50 AM.  HPI:   Diana Bush is a 74 y.o. 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  Her daughter reports that her pain began after starting physical therapy.  Her daughter adds that her pain is worse with movement and she thinks it is secondary to muscle spasms.  She has tried flexeril and tramadol with minimal relief.  Pt has been unable to walk since her stroke.   Her daughter denies new neurological deficits, weakness and changes in speech.  Pt wears depends but daughter denies abnormal loss of bowel or bladder control.  No known falls but daughter thinks that someone in physical therapy may have "pulled on her too hard".  Pt's PCP is Dorrene German, MD.     1:03 AM Pt was asleep and too tired to respond to questioning.    ROS: Level V caveat for aphasia  PAST MEDICAL HISTORY/PAST SURGICAL HISTORY:  Past Medical History:  Diagnosis Date  . Borderline hyperlipidemia   . Difficulty sleeping    DUE TO PAIN  . GERD (gastroesophageal reflux disease)   . Hypertension   . Neuromuscular disorder (HCC)   . Osteoarthritis   . PAF (paroxysmal atrial fibrillation) (HCC)   . Stroke [facility])     MEDICATIONS:  Prior to Admission medications   Medication Sig Start Date End Date Taking? Authorizing Provider  apixaban (ELIQUIS) 5 MG TABS tablet Take 5 mg by mouth 2 (two) times daily.   Yes Historical Provider, MD  atorvastatin (LIPITOR) 20 MG tablet Take 1 tablet (20 mg total)  by mouth daily at 6 PM. 08/04/16  Yes Marvel Plan, MD  cyclobenzaprine (FLEXERIL) 10 MG tablet Take 10 mg by mouth 3 (three) times daily as needed for muscle spasms.   Yes Historical Provider, MD  digoxin (LANOXIN) 0.25 MG tablet Take 1 tablet (0.25 mg total) by mouth daily. 08/05/16  Yes Marvel Plan, MD  metoprolol (LOPRESSOR) 50 MG tablet Take 25 mg by mouth 4 (four) times daily.    Yes Historical Provider, MD  sertraline (ZOLOFT) 50 MG tablet Take 1 tablet (50 mg total) by mouth at bedtime. 08/04/16  Yes Marvel Plan, MD  traMADol (ULTRAM) 50 MG tablet Take 50 mg by mouth every 6 (six) hours as needed for moderate pain.   Yes Historical Provider, MD  aspirin EC 325 MG EC tablet Take 1 tablet (325 mg total) by mouth daily. Patient not taking: Reported on 08/18/2016 08/05/16   Marvel Plan, MD    ALLERGIES:  No Known Allergies  SOCIAL HISTORY:  Social History  Substance Use Topics  . Smoking status: Never Smoker  . Smokeless tobacco: Never Used  . Alcohol use No    FAMILY HISTORY: Family History  Problem Relation Age of Onset  . Cancer Mother   . Heart disease Neg Hx   . Diabetes Neg Hx   . Stroke Neg Hx     EXAM: BP 118/61 (BP Location: Left Arm)   Pulse (!) 57   Temp 98.2 F (  36.8 C) (Oral)   Resp (!) 28   Ht 5\' 5"  (1.651 m)   Wt 171 lb (77.6 kg)   SpO2 98%   BMI 28.46 kg/m  CONSTITUTIONAL: Alert And patient can answer some questions but has aphasia, able to follow some commands, elderly, afebrile, and no significant distress HEAD: Normocephalic EYES: Conjunctivae clear, PERRL ENT: normal nose; no rhinorrhea; moist mucous membranes NECK: Supple, no meningismus, no LAD manner midline spinal tenderness or step-off or deformity, keeps her head turned to the right secondary to neglect of the left side, she has tenderness to palpation over the right sternocleidomastoid and right trapezius with associated muscle spasm CARD: RRR; S1 and S2 appreciated; no murmurs, no clicks, no rubs,  no gallops RESP: Normal chest excursion without splinting or tachypnea; breath sounds clear and equal bilaterally; no wheezes, no rhonchi, no rales, no hypoxia or respiratory distress, speaking full sentences ABD/GI: Normal bowel sounds; non-distended; soft, non-tender, no rebound, no guarding, no peritoneal signs BACK:  The back appears normal and is non-tender to palpation, there is no CVA tenderness midline felt tenderness or step-off or deformity EXT: Normal ROM in all joints; non-tender to palpation; no edema; normal capillary refill; no cyanosis, no calf tenderness or swelling    SKIN: Normal color for age and race; warm; no rash NEURO: Patient has left-sided hemiplegia, able to move the right upper and lower extremity and can follow some commands intermittently, is mostly aphasic, reports normal sensation in her right side but is not reliable historian, normal reflexes in the RUE and RLE and diminished in the LUE and LLE, no clonus, denies saddle anesthesia  MEDICAL DECISION MAKING: Pt here with right sided neck pain.  I suspect this is from muscle spasm, strain likely from keeping her head turned to the right from neglect.  She does not appear to have any new neuro deficits from her recent CVA.  No midline tenderness.  No fever.  Appears to have had a carotid doppler ordered during her recent admission but no results found.  D/w Dr. Roxy Manns on call for neurology.  Appreciate his help.  He agrees that she does not need emergent imaging of her neck vessels at this time since she is already on Eliquis with no new neuro deficitis and has outpatient follow up.  Daughter comfortable with this plan.  I doubt fracture given no injury and no midline tenderness.  Doubt cauda equina, epidural abscess or hematoma, transverse myelitis, discitis, osteomyelitis.  I do not feel she needs emergent MRI of her neck.  Will treat with valium and norco and reassess.  ED PROGRESS: 1:20 AM  Pt appears more comfortable,  resting comfortably. Patient's daughter is comfortable with plan for her to be discharged home. She states she was ordered tramadol and Flexeril when necessary. States patient would not ask for this medication and she is requesting that we order scheduled medications for her. Also requesting that we ask physical therapy to work on neck stretching over the next several days. I have ordered outpatient follow-up with [facility] neurology for her as well as patient's daughter states that this was not scheduled for her after her stroke. Have discussed with him at length return precautions. They're comfortable with this plan.   At this time, I do not feel there is any life-threatening condition present. I have reviewed and discussed all results (EKG, imaging, lab, urine as appropriate), exam findings with patient/family. I have reviewed nursing notes and appropriate previous records.  I feel the  patient is safe to be discharged home without further emergent workup and can continue workup as an outpatient as needed. Discussed usual and customary return precautions. Patient/family verbalize understanding and are comfortable with this plan.  Outpatient follow-up has been provided. All questions have been answered.     I personally performed the services described in this documentation, which was scribed in my presence. The recorded information has been reviewed and is accurate.      Layla MawKristen N Micheale Schlack, DO 08/19/16 0121

## 2016-08-18 NOTE — ED Triage Notes (Signed)
Per [facility], pt from [facility] with complaint of severe neck pain/muscle spasms radiating down the back. Pt had cva 3 weeks ago and has right sided weakness. 130/62, 93%, HR 63, 16. Pt alert but slightly delayed with responses.

## 2016-08-19 DIAGNOSIS — E785 Hyperlipidemia, unspecified: Secondary | ICD-10-CM | POA: Diagnosis not present

## 2016-08-19 DIAGNOSIS — M542 Cervicalgia: Secondary | ICD-10-CM | POA: Diagnosis not present

## 2016-08-19 DIAGNOSIS — I639 Cerebral infarction, unspecified: Secondary | ICD-10-CM | POA: Diagnosis not present

## 2016-08-19 DIAGNOSIS — I4891 Unspecified atrial fibrillation: Secondary | ICD-10-CM | POA: Diagnosis not present

## 2016-08-19 MED ORDER — HYDROCODONE-ACETAMINOPHEN 5-325 MG PO TABS: 1.0000 | ORAL_TABLET | Freq: Four times a day (QID) | ORAL | 0 refills | Status: DC

## 2016-08-19 MED ORDER — DOCUSATE SODIUM 100 MG PO CAPS: 100.0000 mg | ORAL_CAPSULE | Freq: Two times a day (BID) | ORAL | 0 refills | Status: DC

## 2016-08-19 MED ORDER — DIAZEPAM 5 MG PO TABS: 5.0000 mg | ORAL_TABLET | Freq: Two times a day (BID) | ORAL | 0 refills | Status: DC

## 2016-08-19 NOTE — Discharge Instructions (Signed)
I recommend that you canceled tramadol and Flexeril. He may give the patient volume twice a day for muscle spasms and Norco every 6 hours for pain. These medications are ordered for short-term only and should be reassessed by a provider at the nursing home. I recommended the physical therapist over the next several days work on helping patient stretch her neck given she is having acute right-sided torticollis.  You may alternate heat and ice to this area to help with pain.  [facility] recent [facility].

## 2016-08-19 NOTE — ED Notes (Signed)
Pt verbalized understanding of d/c instructions and has no further questions. Pt stable and NAD. Pt d/cf home with ptar and report attempted to NH but no answer. VSS.

## 2016-08-19 NOTE — ED Notes (Signed)
Called [facility] for transport.  

## 2016-08-23 LAB — VAS US CAROTID
LCCADDIAS: -11 cm/s
LCCADSYS: -58 cm/s
LCCAPDIAS: 9 cm/s
LEFT VERTEBRAL DIAS: 19 cm/s
Left CCA prox sys: 96 cm/s
Left ICA dist dias: -19 cm/s
Left ICA dist sys: -49 cm/s
Left ICA prox dias: 13 cm/s
Left ICA prox sys: 76 cm/s
RCCADSYS: -65 cm/s
RCCAPDIAS: 11 cm/s
RCCAPSYS: 80 cm/s
RIGHT ECA DIAS: -13 cm/s
RIGHT VERTEBRAL DIAS: 10 cm/s

## 2016-08-25 DIAGNOSIS — I4891 Unspecified atrial fibrillation: Secondary | ICD-10-CM | POA: Diagnosis not present

## 2016-08-25 DIAGNOSIS — I10 Essential (primary) hypertension: Secondary | ICD-10-CM | POA: Diagnosis not present

## 2016-08-25 DIAGNOSIS — I639 Cerebral infarction, unspecified: Secondary | ICD-10-CM | POA: Diagnosis not present

## 2016-08-25 DIAGNOSIS — G709 Myoneural disorder, unspecified: Secondary | ICD-10-CM | POA: Diagnosis not present

## 2016-08-30 DIAGNOSIS — I10 Essential (primary) hypertension: Secondary | ICD-10-CM | POA: Diagnosis not present

## 2016-08-30 DIAGNOSIS — I8290 Acute embolism and thrombosis of unspecified vein: Secondary | ICD-10-CM | POA: Diagnosis not present

## 2016-08-30 DIAGNOSIS — I639 Cerebral infarction, unspecified: Secondary | ICD-10-CM | POA: Diagnosis not present

## 2016-08-30 DIAGNOSIS — I4891 Unspecified atrial fibrillation: Secondary | ICD-10-CM | POA: Diagnosis not present

## 2016-08-31 ENCOUNTER — Ambulatory Visit (INDEPENDENT_AMBULATORY_CARE_PROVIDER_SITE_OTHER): Payer: Medicare Other | Admitting: Orthopaedic Surgery

## 2016-08-31 ENCOUNTER — Ambulatory Visit (INDEPENDENT_AMBULATORY_CARE_PROVIDER_SITE_OTHER): Payer: Medicare Other

## 2016-08-31 DIAGNOSIS — M542 Cervicalgia: Secondary | ICD-10-CM | POA: Diagnosis not present

## 2016-08-31 DIAGNOSIS — I639 Cerebral infarction, unspecified: Secondary | ICD-10-CM

## 2016-08-31 DIAGNOSIS — Z96641 Presence of right artificial hip joint: Secondary | ICD-10-CM | POA: Diagnosis not present

## 2016-08-31 NOTE — Progress Notes (Signed)
Office Visit Note   Patient: Diana Bush           Date of Birth: 1941-07-05           MRN: [id number] Visit Date: 08/31/2016              Requested by: [name], MD [address] [place], [place] [address] PCP: Dorrene German, MD   Assessment & Plan: Visit Diagnoses:  1. History of total hip replacement, right     Plan: She does need a neurology follow-up for her spasms and pain.  Will increase her neurontin to 300 mg TID, stop valium and change to Baclofen 10 mg TID prn for spasms, and tried Lidoerm patches for pain since can not take NSAIDs due to blood thinner.  Follow-up prn   Follow-Up Instructions: No Follow-up on file.   Orders:  Orders Placed This Encounter  Procedures  . XR Pelvis 1-2 Views   No orders of the defined types were placed in this encounter.     Procedures: No procedures performed   Clinical Data: No additional findings.   Subjective: Chief Complaint  Patient presents with  . Right Hip - Follow-up    08/22/15 s/p THA    Pt is a resident a [facility]. Patient does not report any pain. She is s/p a stoke and the left side is significantly affected. She is in a wheelchair and is only able to state " pain" and point.  Family reports that she has neck pain and cries out with spasms when working with therapy.  Has a Neurology appointment next month.  Review of Systems   Objective: Vital Signs: There were no vitals taken for this visit.  Physical Exam  Ortho Exam Right operative hip stable. Normal passive motion of shoulders. Decreased neck motion.   Specialty Comments:  No specialty comments available.  Imaging: No results found.   PMFS History: Patient Active Problem List   Diagnosis Date Noted  . Hypertensive emergency 08/04/2016  . Hypokalemia 08/04/2016  . Depression   . Cerebrovascular accident (CVA) due to thrombosis of right middle cerebral artery (HCC) s/p mechanical thrombectomy   . Hemiplegia affecting  left nondominant side (HCC)   . HLD (hyperlipidemia)   . Dysphagia, post-stroke   . Dysarthria, post-stroke   . Expressive aphasia   . Acute blood loss anemia   . Benign essential HTN   . Atrial fibrillation with RVR (HCC) 07/29/2016  . Osteoarthritis of right hip 08/22/2015  . 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   Past Medical History:  Diagnosis Date  . Borderline hyperlipidemia   . Difficulty sleeping    DUE TO PAIN  . GERD (gastroesophageal reflux disease)   . Hypertension   . Neuromuscular disorder (HCC)   . Osteoarthritis   . PAF (paroxysmal atrial fibrillation) (HCC)   . Stroke [facility])     Family History  Problem Relation Age of Onset  . Cancer Mother   . Heart disease Neg Hx   . Diabetes Neg Hx   . Stroke Neg Hx     Past Surgical History:  Procedure Laterality Date  . IR GENERIC HISTORICAL  07/28/2016   IR PERCUTANEOUS ART THROMBECTOMY/INFUSION INTRACRANIAL INC DIAG ANGIO 07/28/2016 Julieanne Cotton, MD [facility]-INTERV RAD  . LYMPH NODE BIOPSY     BENIGN  . RADIOLOGY WITH ANESTHESIA N/A 07/28/2016   Procedure: RADIOLOGY WITH ANESTHESIA;  Surgeon: Julieanne Cotton, MD;  Location: [facility] OR;  Service: Radiology;  Laterality:  N/A;  . TOTAL HIP ARTHROPLASTY Right 08/22/2015   Procedure: RIGHT TOTAL HIP ARTHROPLASTY ANTERIOR APPROACH;  Surgeon: Kathryne Hitchhristopher Y Zoella Roberti, MD;  Location: [facility] ORS;  Service: Orthopedics;  Laterality: Right;   Social History   Occupational History  . Not on file.   Social History Main Topics  . Smoking status: Never Smoker  . Smokeless tobacco: Never Used  . Alcohol use No  . Drug use: No  . Sexual activity: Not on file

## 2016-09-03 DIAGNOSIS — H2513 Age-related nuclear cataract, bilateral: Secondary | ICD-10-CM | POA: Diagnosis not present

## 2016-09-08 ENCOUNTER — Ambulatory Visit (INDEPENDENT_AMBULATORY_CARE_PROVIDER_SITE_OTHER): Payer: Medicare Other | Admitting: Diagnostic Neuroimaging

## 2016-09-08 ENCOUNTER — Encounter: Payer: Self-pay | Admitting: Diagnostic Neuroimaging

## 2016-09-08 VITALS — BP 150/86 | HR 70

## 2016-09-08 DIAGNOSIS — I63311 Cerebral infarction due to thrombosis of right middle cerebral artery: Secondary | ICD-10-CM

## 2016-09-08 DIAGNOSIS — G8104 Flaccid hemiplegia affecting left nondominant side: Secondary | ICD-10-CM | POA: Diagnosis not present

## 2016-09-08 DIAGNOSIS — E785 Hyperlipidemia, unspecified: Secondary | ICD-10-CM | POA: Diagnosis not present

## 2016-09-08 DIAGNOSIS — I82622 Acute embolism and thrombosis of deep veins of left upper extremity: Secondary | ICD-10-CM

## 2016-09-08 DIAGNOSIS — I639 Cerebral infarction, unspecified: Secondary | ICD-10-CM | POA: Diagnosis not present

## 2016-09-08 DIAGNOSIS — I10 Essential (primary) hypertension: Secondary | ICD-10-CM | POA: Diagnosis not present

## 2016-09-08 DIAGNOSIS — I4891 Unspecified atrial fibrillation: Secondary | ICD-10-CM

## 2016-09-08 NOTE — Patient Instructions (Signed)
-   repeat CT head (evaluate increasing facial weakness / twisting)  - follow up with Dr. Arther DamesK Husain regarding left arm "blood clot" and referral to cardiology follow up for atrial fibrillation  - follow up with Dr. Roda ShuttersXu in 3-4 months for stroke workup

## 2016-09-08 NOTE — Progress Notes (Signed)
[facility]  PATIENT: Diana ColonelMary Bush DOB: [birth year]  REFERRING CLINICIAN: Xu HISTORY FROM: patient, daughter, grandson REASON FOR VISIT: new consult    HISTORICAL  CHIEF COMPLAINT:  Chief Complaint  Patient presents with  . Stroke    rm 7, New pt, hosp FU, dgtr- Lurena Joinerebecca, grandson- Kian    HISTORY OF PRESENT ILLNESS:   75 year old female with history of hypertension, here for evaluation of stroke. 07/28/16 patient had sudden onset of left hemiplegia, speech and language difficulty and right gaze preference. Patient was brought to the hospital and code stroke was activated. IV TPA was given. Patient then went to interventional radiology and had near complete revascularization of occluded right superior branch of middle cerebral artery (with Solitaire device and intra-arterial Integrilin). Patient then went to neuro ICU for further evaluation. Stroke workup was completed. Patient was diagnosed with atrial fibrillation and treated with eliquis anticoagulation. Patient was discharged to skilled nursing facility and currently is residing at [facility].  Since that time patient has had increasing pain and swelling in the left upper extremity. Ultrasound was obtained which apparently showed a deep vein thrombosis. Patient had been on eliquis, and therefore no change in management was made.  Patient's daughter is concerned and confused about management, prognosis and treatment options of stroke, blood clot in left arm, neck pain, arm pain, language difficulty.  Also there is report of increasing facial twisting and worsening language function per family.     REVIEW OF SYSTEMS: Full 14 system review of systems performed and negative with exception of: Blurred vision constipation joint pain specifically weakness in arm and allergies I discharge frequent waking.  ALLERGIES: No Known Allergies  HOME MEDICATIONS: Outpatient Medications Prior to Visit    Medication Sig Dispense Refill  . apixaban (ELIQUIS) 5 MG TABS tablet Take 5 mg by mouth 2 (two) times daily.    Marland Kitchen. atorvastatin (LIPITOR) 20 MG tablet Take 1 tablet (20 mg total) by mouth daily at 6 PM. 30 tablet 2  . digoxin (LANOXIN) 0.25 MG tablet Take 1 tablet (0.25 mg total) by mouth daily. 30 tablet 2  . docusate sodium (COLACE) 100 MG capsule Take 1 capsule (100 mg total) by mouth every 12 (twelve) hours. 60 capsule 0  . gabapentin (NEURONTIN) 100 MG capsule Take 100 mg by mouth 3 (three) times daily.    Marland Kitchen. HYDROcodone-acetaminophen (NORCO/VICODIN) 5-325 MG tablet Take 1 tablet by mouth every 6 (six) hours. 30 tablet 0  . metoprolol (LOPRESSOR) 50 MG tablet Take 25 mg by mouth 4 (four) times daily.     . sertraline (ZOLOFT) 50 MG tablet Take 1 tablet (50 mg total) by mouth at bedtime. 30 tablet 2  . diazepam (VALIUM) 5 MG tablet Take 1 tablet (5 mg total) by mouth 2 (two) times daily. (Patient not taking: Reported on 09/08/2016) 30 tablet 0   No facility-administered medications prior to visit.     PAST MEDICAL HISTORY: Past Medical History:  Diagnosis Date  . Borderline hyperlipidemia   . Difficulty sleeping    DUE TO PAIN  . GERD (gastroesophageal reflux disease)   . Hypertension   . Neuromuscular disorder (HCC)   . Osteoarthritis   . PAF (paroxysmal atrial fibrillation) (HCC)   . Stroke [facility](HCC) 07/2016    PAST SURGICAL HISTORY: Past Surgical History:  Procedure Laterality Date  . IR GENERIC HISTORICAL  07/28/2016   IR PERCUTANEOUS ART THROMBECTOMY/INFUSION INTRACRANIAL INC DIAG ANGIO 07/28/2016 [name], MD [facility]-INTERV RAD  .  LYMPH NODE BIOPSY     BENIGN  . RADIOLOGY WITH ANESTHESIA N/A 07/28/2016   Procedure: RADIOLOGY WITH ANESTHESIA;  Surgeon: [name], MD;  Location: [facility] OR;  Service: Radiology;  Laterality: N/A;  . TOTAL HIP ARTHROPLASTY Right 08/22/2015   Procedure: RIGHT TOTAL HIP ARTHROPLASTY ANTERIOR APPROACH;  Surgeon: Kathryne Hitchhristopher Y Blackman, MD;   Location: [facility] ORS;  Service: Orthopedics;  Laterality: Right;    FAMILY HISTORY: Family History  Problem Relation Age of Onset  . Cancer Mother   . Heart disease Neg Hx   . Diabetes Neg Hx   . Stroke Neg Hx     SOCIAL HISTORY:  Social History   Social History  . Marital status: Married    Spouse name: N/A  . Number of children: 5  . Years of education: 12   Occupational History  .      retired, caregiver for husband   Social History Main Topics  . Smoking status: Never Smoker  . Smokeless tobacco: Never Used  . Alcohol use No  . Drug use: No  . Sexual activity: Not on file   Other Topics Concern  . Not on file   Social History Narrative   ** Merged History Encounter **    09/08/16 currently at [facility], lives with husband         PHYSICAL EXAM  GENERAL EXAM/CONSTITUTIONAL: Vitals:  Vitals:   09/08/16 1346  BP: (!) 150/86  Pulse: 70     There is no height or weight on file to calculate BMI.  No exam data present  Patient is in no distress; well developed, nourished and groomed; neck is supple  CARDIOVASCULAR:  Examination of carotid arteries is normal; no carotid bruits  Regular rate and rhythm, no murmurs  Examination of peripheral vascular system by observation and palpation is normal  EYES:  Ophthalmoscopic exam of optic discs and posterior segments is normal; no papilledema or hemorrhages  MUSCULOSKELETAL:  Gait, strength, tone, movements noted in Neurologic exam below  NEUROLOGIC: MENTAL STATUS:  No flowsheet data found.   awake, alert, oriented to person; NOT PLACE OR TIME  DECR memory  DECR attention and concentration  MODERATE-SEVERE EXPRESSIVE APHASIA  MILD COMPREHENSION DIFFICULTY  MODERATE DYSARTHRIA  DECR NAMING  ABLE TO REPEAT SIMPLE  DECR fund of knowledge   CRANIAL NERVE:   2nd - no papilledema on fundoscopic exam  2nd, 3rd, 4th, 6th - pupils equal and reactive to light, DECR LEFT VISUAL FIELD;  LEFT NEGLECT; RIGHT GAZE PREFERENCE; extraocular muscles intact, no nystagmus  5th - facial sensation DECR ON LEFT  7th - facial strength --> DECR ON LEFT   8th - hearing intact  9th - palate elevates symmetrically, uvula midline  11th - shoulder shrug symmetric  12th - tongue protrusion midline  MOTOR:   normal bulk and tone IN RUE  RLE 3-4 WITH NORMAL TONE  LUE FLACCID HEMIPLEGIA; SWOLLEN, WARN, SLIGHTLY TENDER  LLE FLACCID HEMIPARESIS (1-2/5)  SENSORY:   LEFT NEGLECT  DECR SENSATION IN LUE AND LLE  COORDINATION:   finger-nose-finger, fine finger movements LIMITED ON LEFT SIDE  REFLEXES:   deep tendon reflexes SLIGHTLY BRISK IN LUE  GAIT/STATION:   IN WHEEL CHAIR    DIAGNOSTIC DATA (LABS, IMAGING, TESTING) - I reviewed patient records, labs, notes, testing and imaging myself where available.  Lab Results  Component Value Date   WBC 5.0 08/04/2016   HGB 10.3 (L) 08/04/2016   HCT 33.8 (L) 08/04/2016   MCV  86.9 08/04/2016   PLT 215 08/04/2016      Component Value Date/Time   NA 136 08/02/2016 0308   K 3.7 08/02/2016 0308   CL 105 08/02/2016 0308   CO2 24 08/02/2016 0308   GLUCOSE 93 08/02/2016 0308   BUN 8 08/02/2016 0308   CREATININE 0.55 08/02/2016 0308   CALCIUM 9.0 08/02/2016 0308   PROT 7.0 07/28/2016 2034   ALBUMIN 3.8 07/28/2016 2034   AST 25 07/28/2016 2034   ALT 42 07/28/2016 2034   ALKPHOS 68 07/28/2016 2034   BILITOT 0.7 07/28/2016 2034   GFRNONAA >60 08/02/2016 0308   GFRAA >60 08/02/2016 0308   Lab Results  Component Value Date   CHOL 143 07/29/2016   HDL 43 07/29/2016   LDLCALC 90 07/29/2016   TRIG 69 07/31/2016   CHOLHDL 3.3 07/29/2016   Lab Results  Component Value Date   HGBA1C 5.6 07/29/2016   No results found for: VITAMINB12 No results found for: TSH  07/28/16 CT head [I reviewed images myself and agree with interpretation. -VRP]  1. Acute RIGHT MCA territory nonhemorrhagic infarct with dense RIGHT MCA.  Otherwise negative CT HEAD for age. 2. ASPECTS is 8.  07/29/16 CT head [I reviewed images myself and agree with interpretation. -VRP]  1. No complication identified status post recent catheter directed therapy for right MCA embolism. 2. Evolving hypodensity within the right insular cortex, compatible with evolving ischemia.  07/29/16 MRI brain [I reviewed images myself and agree with interpretation. -VRP]  - Significant progression of acute infarction compared with the preoperative code stroke CT, now with involvement of the insula, posterior frontal and anterior parietal cortex and regional white matter. No visible hemorrhagic transformation.  07/30/16 TTE - Left ventricle: The cavity size was normal. There was moderate   concentric hypertrophy. Systolic function was vigorous. The   estimated ejection fraction was in the range of 65% to 70%. Wall   motion was normal; there were no regional wall motion   abnormalities. The study was not technically sufficient to allow   evaluation of LV diastolic dysfunction due to atrial   fibrillation. - Aortic valve: Trileaflet; mildly thickened, mildly calcified   leaflets. There was mild regurgitation. - Aortic root: The aortic root was normal in size. - Mitral valve: Calcified annulus. Mildly thickened leaflets .   There was no regurgitation. - Left atrium: The atrium was normal in size. - Right ventricle: Systolic function was normal. - Right atrium: The atrium was mildly dilated. - Tricuspid valve: There was moderate regurgitation. - Pulmonic valve: There was mild regurgitation. - Pulmonary arteries: The main pulmonary artery was normal-sized.   Systolic pressure was moderately increased. PA peak pressure: 48   mm Hg (S). - Inferior vena cava: The vessel was dilated. The respirophasic   diameter changes were blunted (< 50%), consistent with elevated   central venous pressure. - Pericardium, extracardiac: There was no pericardial  effusion.  08/02/16 Carotid u/s - Technically difficult study due to patient movement, cooperation, vessel tortuosity, and acoustic shadowing. Findings are consistent with a 1-39 percent stenosis involving the right internal carotid artery and the left internal carotid artery. The vertebral arteries demonstrate antegrade flow.      ASSESSMENT AND PLAN  75 y.o. year old female here with history of HTN presenting with right gaze and left hemiplegia. She received IV t-PA 07/28/2016 at 2106. Taken to intervention, where she received near complete revascularization of occluded superior right MCA with Solitaire and intra-arterial Integrilin with TICI  2B revascularization.  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)  Arm DVT (deep venous thromboembolism), acute, left (HCC)    Stroke:  right MCA infarct s/p IV tPA and thrombectomy with TICI2b revascularization of R MCA, embolic secondary to newly diagnosed atrial fibrillation   Resultant  left hemiplegia, left neglect, dysarthric, expressive > receptive aphasia  Atrial Fibrillation/Flutter with RVR new onset  Metoprolol 25 mg qid   Continue anticoagulation (eliquis 5mg  BID)  Hypertension  Continue metoprolol  Hyperlipidemia  Continue lipitor 20mg  daily  Depression  Continue sertraline 50 mg daily   Other Stroke Risk Factors  Obesity, Body mass index is 28.52 kg/m., recommend weight loss, diet and exercise as appropriate     PLAN: - repeat CT head (evaluate increasing facial weakness / twisting) - follow up with SNF attending (dr. Arther Dames) regarding left arm DVT and referral to cardiology follow up for atrial fibrillation - follow up with Dr. Roda Shutters in 3-4 months for stroke workup  Return in about 3 months (around 12/09/2016) for  with Dr. Roda Shutters in 3-4 months.  I reviewed images, labs, notes, records myself. I summarized findings and reviewed with patient, for this high risk condition (STROKE; increasing left arm pain; left arm DVT) requiring high complexity decision making.     Suanne Marker, MD 09/08/2016, 2:45 PM Certified in Neurology, Neurophysiology and Neuroimaging  [facility] [address] [place], [place] [address] [phone]

## 2016-09-13 ENCOUNTER — Other Ambulatory Visit: Payer: Self-pay | Admitting: Internal Medicine

## 2016-09-14 ENCOUNTER — Telehealth: Payer: Self-pay | Admitting: Diagnostic Neuroimaging

## 2016-09-14 NOTE — Telephone Encounter (Signed)
Spoke with daughter, Lurena JoinerRebecca and informed her that per Dr Richrd HumblesPenumalli's office visit note, she is to have Dr Eula ListenHussain, residing provider for  [facility] to refer her to cardiologist.  Daughter stated Dr Eula ListenHussain is [place] the [place]. Advised her that [facility] should have another provider to cover the residents in his absence.  She stated she would call to find out. Advised she call back for further needs. She verbalized understanding.

## 2016-09-14 NOTE — Telephone Encounter (Signed)
Pt's daughter called inquiring about the referral for cardiologist discussed at last OV. Please call

## 2016-09-22 ENCOUNTER — Ambulatory Visit
Admission: RE | Admit: 2016-09-22 | Discharge: 2016-09-22 | Disposition: A | Discharge status: Home / Self Care | Payer: Medicare Other | Source: Ambulatory Visit | Attending: Diagnostic Neuroimaging | Admitting: Diagnostic Neuroimaging

## 2016-09-22 DIAGNOSIS — I639 Cerebral infarction, unspecified: Secondary | ICD-10-CM | POA: Diagnosis not present

## 2016-09-22 DIAGNOSIS — I63311 Cerebral infarction due to thrombosis of right middle cerebral artery: Secondary | ICD-10-CM

## 2016-09-23 ENCOUNTER — Telehealth: Payer: Self-pay | Admitting: *Deleted

## 2016-09-23 NOTE — Telephone Encounter (Signed)
Attempted to reach patient's husband to inform of patient's CT Head results. Patient is currently in rehab facility. No answer and voice mailbox full.  Will try to reach later.

## 2016-09-24 DIAGNOSIS — M62838 Other muscle spasm: Secondary | ICD-10-CM | POA: Diagnosis not present

## 2016-09-24 DIAGNOSIS — I10 Essential (primary) hypertension: Secondary | ICD-10-CM | POA: Diagnosis not present

## 2016-09-24 DIAGNOSIS — G8929 Other chronic pain: Secondary | ICD-10-CM | POA: Diagnosis not present

## 2016-09-24 DIAGNOSIS — F322 Major depressive disorder, single episode, severe without psychotic features: Secondary | ICD-10-CM | POA: Diagnosis not present

## 2016-09-24 DIAGNOSIS — R1310 Dysphagia, unspecified: Secondary | ICD-10-CM | POA: Diagnosis not present

## 2016-09-24 DIAGNOSIS — M1990 Unspecified osteoarthritis, unspecified site: Secondary | ICD-10-CM | POA: Diagnosis not present

## 2016-09-24 DIAGNOSIS — I4891 Unspecified atrial fibrillation: Secondary | ICD-10-CM | POA: Diagnosis not present

## 2016-09-24 DIAGNOSIS — I639 Cerebral infarction, unspecified: Secondary | ICD-10-CM | POA: Diagnosis not present

## 2016-09-24 NOTE — Telephone Encounter (Signed)
Reached patient's husband and informed him, per Dr Marjory LiesPenumalli that his wife's scan of her head showed only expected changes from her previous stroke. There are no new findings. He verbalized understanding, appreciation.

## 2016-10-01 DIAGNOSIS — I82622 Acute embolism and thrombosis of deep veins of left upper extremity: Secondary | ICD-10-CM | POA: Diagnosis not present

## 2016-10-01 DIAGNOSIS — I69354 Hemiplegia and hemiparesis following cerebral infarction affecting left non-dominant side: Secondary | ICD-10-CM | POA: Diagnosis not present

## 2016-10-12 DIAGNOSIS — G894 Chronic pain syndrome: Secondary | ICD-10-CM | POA: Diagnosis not present

## 2016-10-12 DIAGNOSIS — F430 Acute stress reaction: Secondary | ICD-10-CM | POA: Diagnosis not present

## 2016-10-12 DIAGNOSIS — I10 Essential (primary) hypertension: Secondary | ICD-10-CM | POA: Diagnosis not present

## 2016-10-12 DIAGNOSIS — I4891 Unspecified atrial fibrillation: Secondary | ICD-10-CM | POA: Diagnosis not present

## 2016-10-12 DIAGNOSIS — I6789 Other cerebrovascular disease: Secondary | ICD-10-CM | POA: Diagnosis not present

## 2016-10-14 ENCOUNTER — Telehealth: Payer: Self-pay | Admitting: Cardiology

## 2016-10-14 NOTE — Telephone Encounter (Signed)
Received records from [facility] for appointment on 11/02/16 with Dr Antoine PocheHochrein.  [facility] ([facility] for Dr Hochrein's schedule on 11/02/16. lp

## 2016-10-20 ENCOUNTER — Emergency Department (HOSPITAL_BASED_OUTPATIENT_CLINIC_OR_DEPARTMENT_OTHER)
Admit: 2016-10-20 | Discharge: 2016-10-20 | Disposition: A | Discharge status: Home / Self Care | Payer: Medicare Other | Attending: Emergency Medicine | Admitting: Emergency Medicine

## 2016-10-20 ENCOUNTER — Emergency Department (HOSPITAL_COMMUNITY)
Admission: EM | Admit: 2016-10-20 | Discharge: 2016-10-20 | Disposition: A | Discharge status: Home / Self Care | Payer: Medicare Other | Attending: Emergency Medicine | Admitting: Emergency Medicine

## 2016-10-20 ENCOUNTER — Emergency Department (HOSPITAL_COMMUNITY): Payer: Medicare Other

## 2016-10-20 ENCOUNTER — Encounter (HOSPITAL_COMMUNITY): Payer: Self-pay | Admitting: Emergency Medicine

## 2016-10-20 DIAGNOSIS — M1711 Unilateral primary osteoarthritis, right knee: Secondary | ICD-10-CM | POA: Insufficient documentation

## 2016-10-20 DIAGNOSIS — M25561 Pain in right knee: Secondary | ICD-10-CM | POA: Diagnosis not present

## 2016-10-20 DIAGNOSIS — M159 Polyosteoarthritis, unspecified: Secondary | ICD-10-CM

## 2016-10-20 DIAGNOSIS — M79606 Pain in leg, unspecified: Secondary | ICD-10-CM

## 2016-10-20 DIAGNOSIS — M25562 Pain in left knee: Secondary | ICD-10-CM | POA: Diagnosis not present

## 2016-10-20 DIAGNOSIS — I10 Essential (primary) hypertension: Secondary | ICD-10-CM | POA: Insufficient documentation

## 2016-10-20 DIAGNOSIS — Z7901 Long term (current) use of anticoagulants: Secondary | ICD-10-CM | POA: Diagnosis not present

## 2016-10-20 DIAGNOSIS — Z79899 Other long term (current) drug therapy: Secondary | ICD-10-CM | POA: Diagnosis not present

## 2016-10-20 DIAGNOSIS — Z8673 Personal history of transient ischemic attack (TIA), and cerebral infarction without residual deficits: Secondary | ICD-10-CM | POA: Diagnosis not present

## 2016-10-20 DIAGNOSIS — Z96641 Presence of right artificial hip joint: Secondary | ICD-10-CM | POA: Insufficient documentation

## 2016-10-20 DIAGNOSIS — M79605 Pain in left leg: Secondary | ICD-10-CM

## 2016-10-20 DIAGNOSIS — M1712 Unilateral primary osteoarthritis, left knee: Secondary | ICD-10-CM | POA: Insufficient documentation

## 2016-10-20 DIAGNOSIS — R030 Elevated blood-pressure reading, without diagnosis of hypertension: Secondary | ICD-10-CM | POA: Diagnosis not present

## 2016-10-20 DIAGNOSIS — M79604 Pain in right leg: Secondary | ICD-10-CM | POA: Diagnosis not present

## 2016-10-20 DIAGNOSIS — R0689 Other abnormalities of breathing: Secondary | ICD-10-CM | POA: Insufficient documentation

## 2016-10-20 DIAGNOSIS — M79609 Pain in unspecified limb: Secondary | ICD-10-CM

## 2016-10-20 DIAGNOSIS — M7989 Other specified soft tissue disorders: Secondary | ICD-10-CM | POA: Diagnosis not present

## 2016-10-20 DIAGNOSIS — M150 Primary generalized (osteo)arthritis: Secondary | ICD-10-CM

## 2016-10-20 LAB — BRAIN NATRIURETIC PEPTIDE: B Natriuretic Peptide: 246 pg/mL — ABNORMAL HIGH (ref 0.0–100.0)

## 2016-10-20 LAB — CBC WITH DIFFERENTIAL/PLATELET
BASOS ABS: 0 10*3/uL (ref 0.0–0.1)
Basophils Relative: 0 %
EOS ABS: 0.1 10*3/uL (ref 0.0–0.7)
EOS PCT: 2 %
HCT: 35.1 % — ABNORMAL LOW (ref 36.0–46.0)
Hemoglobin: 11 g/dL — ABNORMAL LOW (ref 12.0–15.0)
LYMPHS ABS: 1.4 10*3/uL (ref 0.7–4.0)
Lymphocytes Relative: 24 %
MCH: 26.4 pg (ref 26.0–34.0)
MCHC: 31.3 g/dL (ref 30.0–36.0)
MCV: 84.4 fL (ref 78.0–100.0)
Monocytes Absolute: 0.5 10*3/uL (ref 0.1–1.0)
Monocytes Relative: 8 %
Neutro Abs: 4 10*3/uL (ref 1.7–7.7)
Neutrophils Relative %: 66 %
PLATELETS: 232 10*3/uL (ref 150–400)
RBC: 4.16 MIL/uL (ref 3.87–5.11)
RDW: 16.5 % — ABNORMAL HIGH (ref 11.5–15.5)
WBC: 6.1 10*3/uL (ref 4.0–10.5)

## 2016-10-20 LAB — COMPREHENSIVE METABOLIC PANEL
ALT: 18 U/L (ref 14–54)
AST: 21 U/L (ref 15–41)
Albumin: 3.4 g/dL — ABNORMAL LOW (ref 3.5–5.0)
Alkaline Phosphatase: 71 U/L (ref 38–126)
Anion gap: 6 (ref 5–15)
BUN: 9 mg/dL (ref 6–20)
CHLORIDE: 102 mmol/L (ref 101–111)
CO2: 29 mmol/L (ref 22–32)
CREATININE: 0.6 mg/dL (ref 0.44–1.00)
Calcium: 8.9 mg/dL (ref 8.9–10.3)
GFR calc non Af Amer: >60 mL/min (ref >60)
Glucose, Bld: 115 mg/dL — ABNORMAL HIGH (ref 65–99)
POTASSIUM: 3.6 mmol/L (ref 3.5–5.1)
SODIUM: 137 mmol/L (ref 135–145)
Total Bilirubin: 0.7 mg/dL (ref 0.3–1.2)
Total Protein: 6.7 g/dL (ref 6.5–8.1)

## 2016-10-20 MED ORDER — HYDROCODONE-ACETAMINOPHEN 5-325 MG PO TABS: 2.0000 | ORAL_TABLET | Freq: Four times a day (QID) | ORAL | 0 refills | Status: DC | PRN

## 2016-10-20 MED ORDER — ACETAMINOPHEN 500 MG PO TABS
1000.0000 mg | ORAL_TABLET | Freq: Once | ORAL | Status: AC
  Administered 2016-10-20: 1000 mg via ORAL
  Filled 2016-10-20: qty 2

## 2016-10-20 NOTE — Progress Notes (Signed)
VASCULAR LAB PRELIMINARY  PRELIMINARY  PRELIMINARY  PRELIMINARY  Bilateral lower extremity venous duplex completed.    Preliminary report:  Right:  No evidence of DVT, superficial thrombosis, or Baker's cyst. Left:  No evidence of DVT or superficial thrombosis. Positive for a Baker's cyst in the popliteal fossa.  Chike Farrington, RVS 10/20/2016, 2:21 PM

## 2016-10-20 NOTE — ED Triage Notes (Signed)
Per EMS, patient stopped taking her Eliquis 1 week ago; 3 days ago noticed left leg/foot swelling. Today, there is bilateral leg pain. Hx of DVT. Patient has one in her left upper arm right now. Patient denies all other symptoms. Patient is from home with family. Patient is normally bedbound/wheelchair bound. Hx of left sided weakness from a previous stroke.

## 2016-10-20 NOTE — ED Notes (Signed)
Patient transported to X-ray 

## 2016-10-20 NOTE — ED Notes (Signed)
Discharge instructions, follow up care, and rx x1 reviewed with patient and patient'ws family member. Patient and family member verbalized understanding.

## 2016-10-20 NOTE — ED Notes (Signed)
Bed: [id number] Expected date:  Expected time:  Means of arrival:  Comments: EMS 75 yo, leg pain, hx dvt

## 2016-10-20 NOTE — ED Provider Notes (Signed)
[facility]-EMERGENCY DEPT Provider Note   CSN: [id number] Arrival date & time: 10/20/16  1248     History   Chief Complaint Chief Complaint  Patient presents with  . Leg Pain    Bilateral    HPI Diana Bush is a 75 y.o. female.  HPI   Chronic pain in knees, but now pain in whole legs Blood pressure has been increased since yesterday Screaming out in pain with movement of the legs, severe pain, both sides Left side had swelling since probably Monday, and now looks like both sides are swollen Pain must be severe for BP to be elevated No fevers Since stroke has been in wheelchair, using hoyer lift No falls Past Medical History:  Diagnosis Date  . Borderline hyperlipidemia   . Difficulty sleeping    DUE TO PAIN  . GERD (gastroesophageal reflux disease)   . Hypertension   . Neuromuscular disorder (HCC)   . Osteoarthritis   . PAF (paroxysmal atrial fibrillation) (HCC)   . Stroke [facility](HCC) 07/2016    Patient Active Problem List   Diagnosis Date Noted  . Hypertensive emergency 08/04/2016  . Hypokalemia 08/04/2016  . Depression   . Cerebrovascular accident (CVA) due to thrombosis of right middle cerebral artery (HCC) s/p mechanical thrombectomy   . Hemiplegia affecting left nondominant side (HCC)   . HLD (hyperlipidemia)   . Dysphagia, post-stroke   . Dysarthria, post-stroke   . Expressive aphasia   . Acute blood loss anemia   . Benign essential HTN   . Atrial fibrillation with RVR (HCC) 07/29/2016  . Osteoarthritis of right hip 08/22/2015  . 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 IR GENERIC HISTORICAL  07/28/2016   IR PERCUTANEOUS ART THROMBECTOMY/INFUSION INTRACRANIAL INC DIAG ANGIO 07/28/2016 [name], MD [facility]-INTERV RAD  . LYMPH NODE BIOPSY     BENIGN  . RADIOLOGY WITH ANESTHESIA N/A 07/28/2016   Procedure: RADIOLOGY WITH ANESTHESIA;  Surgeon: [name], MD;  Location: [facility] OR;   Service: Radiology;  Laterality: N/A;  . TOTAL HIP ARTHROPLASTY Right 08/22/2015   Procedure: RIGHT TOTAL HIP ARTHROPLASTY ANTERIOR APPROACH;  Surgeon: Kathryne Hitchhristopher Y Blackman, MD;  Location: [facility] ORS;  Service: Orthopedics;  Laterality: Right;    OB History    No data available       Home Medications    Prior to Admission medications   Medication Sig Start Date End Date Taking? Authorizing Provider  apixaban (ELIQUIS) 5 MG TABS tablet Take 5 mg by mouth 2 (two) times daily.   Yes Historical Provider, MD  atorvastatin (LIPITOR) 20 MG tablet Take 1 tablet (20 mg total) by mouth daily at 6 PM. 08/04/16  Yes Marvel PlanJindong Xu, MD  baclofen (LIORESAL) 10 MG tablet Take 10 mg by mouth 3 (three) times daily. 09/08/16 as needed   Yes Historical Provider, MD  digoxin (LANOXIN) 0.25 MG tablet Take 1 tablet (0.25 mg total) by mouth daily. 08/05/16  Yes Marvel PlanJindong Xu, MD  gabapentin (NEURONTIN) 300 MG capsule Take 300 mg by mouth 3 (three) times daily. 10/12/16  Yes Historical Provider, MD  lidocaine (LIDODERM) 5 % Place 1 patch onto the skin daily as needed (pain). Remove & Discard patch within 12 hours or as directed by MD    Yes Historical Provider, MD  losartan-hydrochlorothiazide (HYZAAR) 100-25 MG tablet Take 1 tablet by mouth daily.  09/07/16  Yes Historical Provider, MD  metoprolol (LOPRESSOR) 50 MG tablet Take 50 mg  by mouth 2 (two) times daily.    Yes Historical Provider, MD  sertraline (ZOLOFT) 50 MG tablet Take 1 tablet (50 mg total) by mouth at bedtime. 08/04/16  Yes Marvel PlanJindong Xu, MD  tizanidine (ZANAFLEX) 2 MG capsule Take 2 mg by mouth 3 (three) times daily as needed for muscle spasms.    Yes Historical Provider, MD  HYDROcodone-acetaminophen (NORCO/VICODIN) 5-325 MG tablet Take 2 tablets by mouth every 6 (six) hours as needed. 10/20/16   Alvira MondayErin Nayan Proch, MD    Family History Family History  Problem Relation Age of Onset  . Cancer Mother   . Heart disease Neg Hx   . Diabetes Neg Hx   . Stroke Neg  Hx     Social History Social History  Substance Use Topics  . Smoking status: Never Smoker  . Smokeless tobacco: Never Used  . Alcohol use No     Allergies   Patient has no known allergies.   Review of Systems Review of Systems  Constitutional: Negative for fever.  HENT: Negative for congestion.   Respiratory: Negative for shortness of breath.   Cardiovascular: Positive for leg swelling. Negative for chest pain.  Gastrointestinal: Negative for nausea and vomiting.  Genitourinary: Negative for dysuria.  Musculoskeletal: Positive for arthralgias.  Neurological: Negative for headaches.     Physical Exam Updated Vital Signs BP 184/80 (BP Location: Right Arm)   Pulse (!) 56   Temp 97.5 F (36.4 C) (Oral)   Resp 18   Ht 5\' 5"  (1.651 m)   Wt 171 lb (77.6 kg)   SpO2 95%   BMI 28.46 kg/m   Physical Exam  Constitutional: She is oriented to person, place, and time. She appears well-developed and well-nourished. No distress.  HENT:  Head: Normocephalic and atraumatic.  Eyes: Conjunctivae and EOM are normal.  Neck: Normal range of motion.  Cardiovascular: Normal rate, regular rhythm, normal heart sounds and intact distal pulses.  Exam reveals no gallop and no friction rub.   No murmur heard. Pulmonary/Chest: Effort normal and breath sounds normal. No respiratory distress. She has no wheezes. She has no rales.  Abdominal: Soft. She exhibits no distension. There is no tenderness. There is no guarding.  Musculoskeletal: She exhibits no edema or tenderness.  Left side lower ext swelling greater than right 2+pulses Pain with passive ROM bilateral LE at knee and hip, no sign of erythema  Neurological: She is alert and oriented to person, place, and time.  Skin: Skin is warm and dry. No rash noted. She is not diaphoretic. No erythema.  Nursing note and vitals reviewed.    ED Treatments / Results  Labs (all labs ordered are listed, but only abnormal results are  displayed) Labs Reviewed  CBC WITH DIFFERENTIAL/PLATELET - Abnormal; Notable for the following:       Result Value   Hemoglobin 11.0 (*)    HCT 35.1 (*)    RDW 16.5 (*)    All other components within normal limits  COMPREHENSIVE METABOLIC PANEL - Abnormal; Notable for the following:    Glucose, Bld 115 (*)    Albumin 3.4 (*)    All other components within normal limits  BRAIN NATRIURETIC PEPTIDE - Abnormal; Notable for the following:    B Natriuretic Peptide 246.0 (*)    All other components within normal limits    EKG  EKG Interpretation None       Radiology Dg Knee Complete 4 Views Left  Result Date: 10/20/2016 CLINICAL DATA:  Left leg  swelling for 3 days. EXAM: LEFT KNEE - COMPLETE 4+ VIEW COMPARISON:  None. FINDINGS: No evidence of fracture, dislocation, or joint effusion. Spurring of superior aspect of patella is noted. Osteophyte formation is noted medially and laterally. No significant joint space narrowing is noted. Soft tissues are unremarkable. IMPRESSION: Degenerative changes as described above. No acute abnormality seen in the left knee. Electronically Signed   By: Lupita Raider, M.D.   On: 10/20/2016 15:43   Dg Knee Complete 4 Views Right  Result Date: 10/20/2016 CLINICAL DATA:  Right lower extremity pain. EXAM: RIGHT KNEE - COMPLETE 4+ VIEW COMPARISON:  None. FINDINGS: No evidence of fracture, dislocation, or joint effusion. Spurring of superior aspect of patella is noted. Moderate narrowing of medial joint space is noted with osteophyte formation. Osteophyte formation is noted laterally as well. Soft tissues are unremarkable. IMPRESSION: Moderate degenerative joint disease. No acute abnormality seen in the right knee. Electronically Signed   By: Lupita Raider, M.D.   On: 10/20/2016 15:44   Dg Hips Bilat With Pelvis 3-4 Views  Result Date: 10/20/2016 CLINICAL DATA:  Left foot and leg swelling. Bilateral leg pain. No known injury. EXAM: DG HIP (WITH OR WITHOUT  PELVIS) 3-4V BILAT COMPARISON:  None. FINDINGS: No acute bony or joint abnormality is identified. Right total hip arthroplasty is in place. Hardware is intact. The left hip is unremarkable. There is a large volume of stool in the rectosigmoid colon. IMPRESSION: No acute abnormality or finding to explain the patient's symptoms. Prominent rectosigmoid stool. Electronically Signed   By: Drusilla Kanner M.D.   On: 10/20/2016 15:40    Procedures Procedures (including critical care time)  Medications Ordered in ED Medications  acetaminophen (TYLENOL) tablet 1,000 mg (1,000 mg Oral Given 10/20/16 1529)     Initial Impression / Assessment and Plan / ED Course  I have reviewed the triage vital signs and the nursing notes.  Pertinent labs & imaging results that were available during my care of the patient were reviewed by me and considered in my medical decision making (see chart for details).  Clinical Course    75yo female with hx of CVA, atrial fibrillation, htn, hlpd, off eliquis for days, presents with concern for bilateral leg pain.  DVT study completed and negative for DVT but showed baker's cyst. Strong bilateral pulses, doubt acute arterial occlusion.  XR shows no sign of fx.  No erythema, no significant joint swelling or warmth and doubt septic arthritis. Suspect pain likely secondary to arthritis. Gave small rx for norco prn severe pain and discussed risks. Pt to pick up eliquis from PCP today. Patient discharged in stable condition with understanding of reasons to return.   Final Clinical Impressions(s) / ED Diagnoses   Final diagnoses:  Leg pain  Bilateral leg pain  Primary osteoarthritis involving multiple joints    New Prescriptions Discharge Medication List as of 10/20/2016  3:53 PM       Alvira Monday, MD 10/20/16 2328

## 2016-10-20 NOTE — ED Notes (Signed)
Ultrasound at bedside

## 2016-11-01 NOTE — Progress Notes (Signed)
Cardiology Office Note   Date:  11/02/2016   ID:  Diana Bush, DOB May 27, 1941, MRN [id number]  PCP:  Dorrene GermanEdwin A Avbuere, MD  Cardiologist:   Rollene RotundaJames Adren Dollins, MD   Chief Complaint  Patient presents with  . Atrial Fibrillation      History of Present Illness: Diana Bush is a 75 y.o. female who presents for follow up of atrial fib.  I saw her in Sept when she was in the hospital with an acute CVA and atrial fib with RVR.  Echo demonstrated NL LV function with some TR and perhaps moderately elevated pulmonary pressures.  She had thrombectomy.   Since discharge she has had slow progress.  She is working with PT but is not yet weight bearing.  Her speech is improved.  The patient denies any new symptoms such as chest discomfort, neck or arm discomfort. There has been no new shortness of breath, PND or orthopnea. There have been no reported palpitations, presyncope or syncope.  She has had continued left leg and left arm swelling and reports vein thrombosis in the left arm.    Past Medical History:  Diagnosis Date  . Borderline hyperlipidemia   . Difficulty sleeping    DUE TO PAIN  . GERD (gastroesophageal reflux disease)   . Hypertension   . Neuromuscular disorder (HCC)   . Osteoarthritis   . PAF (paroxysmal atrial fibrillation) (HCC)   . Stroke [facility](HCC) 07/2016    Past Surgical History:  Procedure Laterality Date  . IR GENERIC HISTORICAL  07/28/2016   IR PERCUTANEOUS ART THROMBECTOMY/INFUSION INTRACRANIAL INC DIAG ANGIO 07/28/2016 [name], MD [facility]-INTERV RAD  . LYMPH NODE BIOPSY     BENIGN  . RADIOLOGY WITH ANESTHESIA N/A 07/28/2016   Procedure: RADIOLOGY WITH ANESTHESIA;  Surgeon: [name], MD;  Location: [facility] OR;  Service: Radiology;  Laterality: N/A;  . TOTAL HIP ARTHROPLASTY Right 08/22/2015   Procedure: RIGHT TOTAL HIP ARTHROPLASTY ANTERIOR APPROACH;  Surgeon: Kathryne Hitchhristopher Y Blackman, MD;  Location: [facility] ORS;  Service: Orthopedics;  Laterality: Right;     Current  Outpatient Prescriptions  Medication Sig Dispense Refill  . apixaban (ELIQUIS) 5 MG TABS tablet Take 5 mg by mouth 2 (two) times daily.    Marland Kitchen. atorvastatin (LIPITOR) 20 MG tablet Take 1 tablet (20 mg total) by mouth daily at 6 PM. 30 tablet 2  . baclofen (LIORESAL) 10 MG tablet Take 10 mg by mouth 3 (three) times daily. 09/08/16 as needed    . gabapentin (NEURONTIN) 300 MG capsule Take 300 mg by mouth 3 (three) times daily.  2  . HYDROcodone-acetaminophen (NORCO/VICODIN) 5-325 MG tablet Take 2 tablets by mouth every 6 (six) hours as needed. 10 tablet 0  . lidocaine (LIDODERM) 5 % Place 1 patch onto the skin daily as needed (pain). Remove & Discard patch within 12 hours or as directed by MD     . losartan-hydrochlorothiazide (HYZAAR) 100-25 MG tablet Take 1 tablet by mouth daily.     . metoprolol (LOPRESSOR) 50 MG tablet Take 50 mg by mouth 2 (two) times daily.     . sertraline (ZOLOFT) 50 MG tablet Take 1 tablet (50 mg total) by mouth at bedtime. 30 tablet 2  . tizanidine (ZANAFLEX) 2 MG capsule Take 2 mg by mouth 3 (three) times daily as needed for muscle spasms.     Marland Kitchen. amLODipine (NORVASC) 2.5 MG tablet Take 1 tablet (2.5 mg total) by mouth daily. 30 tablet 11   No current facility-administered medications  for this visit.     Allergies:   Patient has no known allergies.    ROS:  Please see the history of present illness.   Otherwise, review of systems are positive for none.   All other systems are reviewed and negative.    PHYSICAL EXAM: VS:  BP (!) 157/74 (BP Location: Right Arm, Patient Position: Sitting, Cuff Size: Normal)   Pulse (!) 56   Ht 5\' 5"  (1.651 m)  , BMI There is no height or weight on file to calculate BMI. GEN:  No distress NECK:  No jugular venous distention at 90 degrees, waveform within normal limits, carotid upstroke brisk and symmetric, no bruits, no thyromegaly LYMPHATICS:  No cervical adenopathy LUNGS:  Clear to auscultation bilaterally BACK:  No CVA  tenderness CHEST:  Unremarkable HEART:  S1 and S2 within normal limits, no S3, no S4, no clicks, no rubs, no murmurs ABD:  Positive bowel sounds normal in frequency in pitch, no bruits, no rebound, no guarding, unable to assess midline mass or bruit with the patient seated. EXT:  2 plus pulses throughout, moderate left leg and arm non pitting edema, no cyanosis no clubbing SKIN:  No rashes no nodules NEURO:  Cranial nerves II through XII grossly intact, motor grossly intact throughout PSYCH:  Cognitively intact, oriented to person place and time    EKG:  EKG is ordered today. The ekg ordered today demonstrates sinus rhythm, rate 56, axis within normal limits, nonspecific anterior T-wave flattening.   Recent Labs: 07/30/2016: Magnesium 1.9 10/20/2016: ALT 18; B Natriuretic Peptide 246.0; BUN 9; Creatinine, Ser 0.60; Hemoglobin 11.0; Platelets 232; Potassium 3.6; Sodium 137    Lipid Panel    Component Value Date/Time   CHOL 143 07/29/2016 0500   TRIG 69 07/31/2016 0149   HDL 43 07/29/2016 0500   CHOLHDL 3.3 07/29/2016 0500   VLDL 10 07/29/2016 0500   LDLCALC 90 07/29/2016 0500      Wt Readings from Last 3 Encounters:  10/20/16 171 lb (77.6 kg)  08/18/16 171 lb (77.6 kg)  08/05/16 171 lb 6.4 oz (77.7 kg)      Other studies Reviewed: Additional studies/ records that were reviewed today include: .Hospital records Review of the above records demonstrates:  Please see elsewhere in the note.     ASSESSMENT AND PLAN:    ATRIAL FIB:  Diana Bush has a CHA2DS2 - VASc score of 6.  She needs to remain on anticoagulation although she's now in sinus rhythm. I'm going to stop her digoxin. She will be able to afford the Eliquis. I like her to have an appointment with pharmacist to discuss this and whether this needs to be discontinued. If so she will need to transition to warfarin.  HTN:  Her blood pressure has been consistently elevated. I'm going to add Norvasc 2.5 mg  daily.  CVA:  She continues to work with PT.     Current medicines are reviewed at length with the patient today.  The patient does not have concerns regarding medicines.  The following changes have been made:  no change  Labs/ tests ordered today include:   Orders Placed This Encounter  Procedures  . EKG 12-Lead     Disposition:   FU with me in six months.     Signed, Rollene RotundaJames Bryann Gentz, MD  11/02/2016 3:22 PM    [facility]

## 2016-11-02 ENCOUNTER — Encounter: Payer: Self-pay | Admitting: Cardiology

## 2016-11-02 ENCOUNTER — Ambulatory Visit (INDEPENDENT_AMBULATORY_CARE_PROVIDER_SITE_OTHER): Payer: Medicare Other | Admitting: Cardiology

## 2016-11-02 VITALS — BP 157/74 | HR 56 | Ht 65.0 in

## 2016-11-02 DIAGNOSIS — I10 Essential (primary) hypertension: Secondary | ICD-10-CM | POA: Diagnosis not present

## 2016-11-02 DIAGNOSIS — I639 Cerebral infarction, unspecified: Secondary | ICD-10-CM | POA: Diagnosis not present

## 2016-11-02 DIAGNOSIS — I4819 Other persistent atrial fibrillation: Secondary | ICD-10-CM

## 2016-11-02 DIAGNOSIS — I481 Persistent atrial fibrillation: Secondary | ICD-10-CM | POA: Diagnosis not present

## 2016-11-02 MED ORDER — AMLODIPINE BESYLATE 2.5 MG PO TABS: 2.5000 mg | ORAL_TABLET | Freq: Every day | ORAL | 11 refills | Status: AC

## 2016-11-02 NOTE — Patient Instructions (Signed)
Medication Instructions:  STOP- Digoxin START- Amlodipine 2.5 mg daily  Labwork: None Ordered  Testing/Procedures: None Ordered  Follow-Up: Your physician recommends that you schedule a follow-up appointment in: 1 Months with Kristin/Kelley   Your physician wants you to follow-up in: 6 Months. You will receive a reminder letter in the mail two months in advance. If you don't receive a letter, please call our office to schedule the follow-up appointment.  Any Other Special Instructions Will Be Listed Below (If Applicable).  HAPPY NEW YEAR   If you need a refill on your cardiac medications before your next appointment, please call your pharmacy.

## 2016-11-11 ENCOUNTER — Other Ambulatory Visit: Payer: Self-pay

## 2016-11-11 NOTE — Patient Outreach (Signed)
Telephone outreach to patient to obtain mRS was successfully completed, provided by patient's caregiver and daughter. mRS =  5  Sherle PoeNicole Sulema Braid, Conrad BurlingtonB.A.  [facility] Care Management Assistant

## 2016-11-16 ENCOUNTER — Telehealth: Payer: Self-pay | Admitting: Diagnostic Neuroimaging

## 2016-11-16 NOTE — Telephone Encounter (Signed)
Diana Bush with [facility] is calling to clarify if patient is to take magnesium and dosage. Please call

## 2016-11-16 NOTE — Telephone Encounter (Signed)
Spoke with Roe Coombson OT w/[facility] and informed him that there is no record of patient being on magnesium, nor Dr Marjory LiesPenumalli addressing Magnesuim in his office note.  Roe CoombsDon questioned if there was a phone note regarding her taking magnesium for muscle spasms; advised him there is not. He stated she has a follow up with her PCP today, and he will discuss it with PCP. He verbalized understanding, appreciation.

## 2016-11-29 ENCOUNTER — Ambulatory Visit: Payer: Self-pay

## 2016-11-29 NOTE — Progress Notes (Deleted)
Patient ID: Diana Bush                 DOB: Dec 25, 1940                      MRN: [id number]     HPI: Diana Bush is a 76 y.o. female referred by Dr. Antoine PocheHochrein  to HTN clinic. PMH includes Afib with RVR, CVA, hyperlipidemia, HTN, and stroke.    Current HTN meds: losartan-HCTZ 100mg /25mg  daily , metoprolol 50mg  BID, amlodipine 2.5mg  daily  Previously tried: amlodipine 5mg   BP goal: 130/80  Family History:   Social History:   Diet:   Exercise:   Home BP readings:   Wt Readings from Last 3 Encounters:  10/20/16 171 lb (77.6 kg)  08/18/16 171 lb (77.6 kg)  08/05/16 171 lb 6.4 oz (77.7 kg)   BP Readings from Last 3 Encounters:  11/02/16 (!) 157/74  10/20/16 184/80  09/08/16 (!) 150/86   Pulse Readings from Last 3 Encounters:  11/02/16 (!) 56  10/20/16 (!) 56  09/08/16 70    Renal function: CrCl cannot be calculated (Patient's most recent lab result is older than the maximum 21 days allowed.).  Past Medical History:  Diagnosis Date  . Borderline hyperlipidemia   . Difficulty sleeping    DUE TO PAIN  . GERD (gastroesophageal reflux disease)   . Hypertension   . Neuromuscular disorder (HCC)   . Osteoarthritis   . PAF (paroxysmal atrial fibrillation) (HCC)   . Stroke [facility](HCC) 07/2016    Current Outpatient Prescriptions on File Prior to Visit  Medication Sig Dispense Refill  . amLODipine (NORVASC) 2.5 MG tablet Take 1 tablet (2.5 mg total) by mouth daily. 30 tablet 11  . apixaban (ELIQUIS) 5 MG TABS tablet Take 5 mg by mouth 2 (two) times daily.    Marland Kitchen. atorvastatin (LIPITOR) 20 MG tablet Take 1 tablet (20 mg total) by mouth daily at 6 PM. 30 tablet 2  . baclofen (LIORESAL) 10 MG tablet Take 10 mg by mouth 3 (three) times daily. 09/08/16 as needed    . gabapentin (NEURONTIN) 300 MG capsule Take 300 mg by mouth 3 (three) times daily.  2  . HYDROcodone-acetaminophen (NORCO/VICODIN) 5-325 MG tablet Take 2 tablets by mouth every 6 (six) hours as needed. 10 tablet 0  .  lidocaine (LIDODERM) 5 % Place 1 patch onto the skin daily as needed (pain). Remove & Discard patch within 12 hours or as directed by MD     . losartan-hydrochlorothiazide (HYZAAR) 100-25 MG tablet Take 1 tablet by mouth daily.     . metoprolol (LOPRESSOR) 50 MG tablet Take 50 mg by mouth 2 (two) times daily.     . sertraline (ZOLOFT) 50 MG tablet Take 1 tablet (50 mg total) by mouth at bedtime. 30 tablet 2  . tizanidine (ZANAFLEX) 2 MG capsule Take 2 mg by mouth 3 (three) times daily as needed for muscle spasms.      No current facility-administered medications on file prior to visit.     No Known Allergies   Assessment/Plan:  Hypertension :   Primrose Oler Rodriguez-Guzman PharmD, BCPS [facility] [address] [place] [phone] 11/29/2016 1:22 PM

## 2017-01-11 ENCOUNTER — Ambulatory Visit: Payer: Medicare Other | Admitting: Neurology

## 2017-01-12 ENCOUNTER — Encounter: Payer: Self-pay | Admitting: Neurology

## 2017-01-26 ENCOUNTER — Ambulatory Visit: Payer: Medicare Other | Attending: Internal Medicine | Admitting: Physical Therapy

## 2017-01-26 ENCOUNTER — Ambulatory Visit: Payer: Medicare Other | Admitting: *Deleted

## 2017-01-26 DIAGNOSIS — R4701 Aphasia: Secondary | ICD-10-CM | POA: Insufficient documentation

## 2017-02-01 ENCOUNTER — Ambulatory Visit: Payer: Medicare Other | Admitting: Speech Pathology

## 2017-02-01 DIAGNOSIS — R4701 Aphasia: Secondary | ICD-10-CM | POA: Diagnosis not present

## 2017-02-01 NOTE — Therapy (Signed)
[facility] [address] [place], [place], [address] Phone: [phone]   Fax:  [phone]  Speech Language Pathology Evaluation  Patient Details  Name: Diana ColonelMary Bush MRN: [id number] Date of Birth: 1941/07/21 Referring Provider: Dr. Fleet ContrasEdwin Avbuere  Encounter Date: 02/01/2017      End of Session - 02/01/17 1230    Visit Number 1   Number of Visits 17   Date for SLP Re-Evaluation 04/01/17   SLP Start Time 1018   SLP Stop Time  1101   SLP Time Calculation (min) 43 min   Activity Tolerance Patient tolerated treatment well      Past Medical History:  Diagnosis Date  . Borderline hyperlipidemia   . Difficulty sleeping    DUE TO PAIN  . GERD (gastroesophageal reflux disease)   . Hypertension   . Neuromuscular disorder (HCC)   . Osteoarthritis   . PAF (paroxysmal atrial fibrillation) (HCC)   . Stroke [facility](HCC) 07/2016    Past Surgical History:  Procedure Laterality Date  . IR GENERIC HISTORICAL  07/28/2016   IR PERCUTANEOUS ART THROMBECTOMY/INFUSION INTRACRANIAL INC DIAG ANGIO 07/28/2016 [name], MD [facility]-INTERV RAD  . LYMPH NODE BIOPSY     BENIGN  . RADIOLOGY WITH ANESTHESIA N/A 07/28/2016   Procedure: RADIOLOGY WITH ANESTHESIA;  Surgeon: [name], MD;  Location: [facility] OR;  Service: Radiology;  Laterality: N/A;  . TOTAL HIP ARTHROPLASTY Right 08/22/2015   Procedure: RIGHT TOTAL HIP ARTHROPLASTY ANTERIOR APPROACH;  Surgeon: Kathryne Hitchhristopher Y Blackman, MD;  Location: [facility] ORS;  Service: Orthopedics;  Laterality: Right;    There were no vitals filed for this visit.      Subjective Assessment - 02/01/17 1407    Currently in Pain? Yes   Pain Score 10-Worst pain ever   Pain Location Leg   Pain Orientation Right   Pain Descriptors / Indicators Sharp;Shooting   Pain Type Chronic pain   Pain Onset More than a month ago   Pain Frequency Intermittent   Aggravating Factors  being still   Pain Relieving Factors  exercise   Multiple Pain Sites No            SLP Evaluation [facility] - 02/01/17 1023      SLP Visit Information   SLP Received On 02/01/17   Referring Provider Dr. Fleet ContrasEdwin Avbuere   Onset Date 07/28/16   Medical Diagnosis right CVA     Subjective   Subjective "she can't talk"   Patient/Family Stated Goal "to um um uh um accurate speech....  walk"     General Information   HPI 76 year old female with history of hypertension. On 07/28/16 patient had sudden onset of left hemiplegia, speech and language difficulty and right gaze preference. Patient was brought to the hospital and code stroke was activated. IV TPA was given. Patient then went to interventional radiology and had near complete revascularization of occluded right superior branch of middle cerebral artery (with Solitaire device and intra-arterial Integrilin). Patient then went to neuro ICU for further evaluation. Stroke workup was completed. Patient was diagnosed with atrial fibrillation and treated with eliquis anticoagulation. Patient was discharged to skilled nursing facility . Pt d/c'd home before Thanksgiving, however she did not receive HHST according to pt and spouse.    Mobility Status wheelchair     Prior Functional Status   Cognitive/Linguistic Baseline Within functional limits   Type of Home House    Lives With Spouse;Son  grandson   Available Support Family  Vocation Retired     Copy Status Within Functional Limits for tasks assessed   Attention Selective   Awareness Impaired   Awareness Impairment Emergent impairment   Problem Solving Impaired   Behaviors --  Required ongong cues to attend to left arm throughout eval;      Auditory Comprehension   Overall Auditory Comprehension Impaired   Yes/No Questions Impaired   Commands Impaired   Two Step Basic Commands 50-74% accurate   Multistep Basic Commands 25-49% accurate   Conversation Simple   EffectiveTechniques Slowed  speech;Pausing;Extra processing time     Visual Recognition/Discrimination   Discrimination Within Function Limits     Reading Comprehension   Reading Status Impaired   Word level 76-100% accurate   Sentence Level 76-100% accurate   Paragraph Level Not tested   Functional Environmental (signs, name badge) Impaired   Interfering Components Attention;Left neglect/inattention   Effective Techniques Verbal cueing     Expression   Primary Mode of Expression Verbal     Verbal Expression   Overall Verbal Expression Impaired   Initiation Impaired   Automatic Speech Name;Social Response;Counting;Day of week;Month of year  slow, mildly dysfluent   Level of Generative/Spontaneous Verbalization Word;Phrase   Repetition Impaired   Level of Impairment Phrase level   Naming Impairment   Responsive 76-100% accurate   Confrontation 75-100% accurate   Convergent Not tested   Divergent 75-100% accurate   Verbal Errors --  agrammatic   Pragmatics No impairment   Effective Techniques Written cues;Semantic cues     Written Expression   Dominant Hand Right   Written Expression Exceptions to [facility]   Self Formulation Ability Word   Interfering Components Left neglect/inattention     Oral Motor/Sensory Function   Overall Oral Motor/Sensory Function Appears within functional limits for tasks assessed     Motor Speech   Overall Motor Speech Impaired   Respiration Within functional limits   Phonation Normal   Resonance Within functional limits   Articulation Within functional limitis   Intelligibility Intelligible   Motor Planning Impaired   Level of Impairment Phrase   Motor Speech Errors Inconsistent;Aware   Effective Techniques Slow rate;Pause       Clinical Impression   [name], a 76 y.o. is s/p right CVA in July 28 2016. She was hospitalized until 08/04/16 then d/c'd to SNF for rehab. She was d/c'd from [facility] 09/2016, and has not received ST since SNF d/c. She presents today  with moderate non-fluent aphasia characterized by agrammatic speech with mild motor planning impairment . Auditory comprehension is intact to 1-2 step commands and simple yes/no questions. Automatic speech is mildly halting, but intact. Repetition is intact to word level, impaired at phrase level. Written expression in intact to biographical information and simple words. Reading comprehension is intact to setence level. Confrontation and responsive naming are intact for simple objects and simple divergent naming is realtively intact. In conversation, word finding difficulty becomes evident.. Pt mixes up yes/no and overall conversation is slow and halting. Pt has left neglect and consistently required cues to pick up her left arm, attend to the left side of written materials and cookie theft picture. Cookie theft picture descrption was void of function words and morphological markers, with perponderance of fillers such as "uh" and "um." Her husband reports much difficulty communicating with his wife. Although cognitive impairments are evident, I recommend focus on speech/language at this time due to severity of communication difficulty. Consider adding cognitive  goals as language improves.  I recommend skilled ST to maximize verbal expression for improved safety, independence and QOL                 SLP Education - 02/20/2017 1230    Education provided Yes   Education Details goals for ST, areas of impairment,    Person(s) Educated Patient;Spouse   Methods Explanation;Verbal cues   Comprehension Verbalized understanding;Verbal cues required          SLP Short Term Goals - 2017/02/20 1419      SLP SHORT TERM GOAL #1   Title Pt will complete moderately complex naming tasks with 90% accuracy and occasional min A.   Time 4   Period Weeks   Status New     SLP SHORT TERM GOAL #2   Title Pt will generate grammatical  simple subject-verb-object sentences with 80% accuracy and occasional min A.    Time 4   Period Weeks   Status New     SLP SHORT TERM GOAL #3   Title Pt will attend to the left side of the page when reading short 3-5 sentence paragraphs with occasional min A   Time 4   Period Weeks   Status New          SLP Long Term Goals - 2017-02-20 1422      SLP LONG TERM GOAL #1   Title Pt will participate in simple 5 minute conversation with approprate grammar, subject verb agreement 80% of utterances and occasional min A   Time 8   Period Weeks   Status New     SLP LONG TERM GOAL #2   Title Pt will generate 2-3 sentence utterances in structured tasks with 85% accuracy and occasional min A.   Time 8   Period Weeks   Status New        Patient will benefit from skilled therapeutic intervention in order to improve the following deficits and impairments:   Aphasia - Plan: SLP plan of care cert/re-cert      G-Codes - 02/20/2017 Apr 04, 1231    Functional Assessment Tool Used NOMS   Functional Limitations Spoken language expressive   Spoken Language Expression Current Status [phone]) At least 60 percent but less than 80 percent impaired, limited or restricted   Spoken Language Expression Goal Status ([id number]) At least 40 percent but less than 60 percent impaired, limited or restricted      Problem List Patient Active Problem List   Diagnosis Date Noted  . Hypertensive emergency 08/04/2016  . Hypokalemia 08/04/2016  . Depression   . Cerebrovascular accident (CVA) due to thrombosis of right middle cerebral artery (HCC) s/p mechanical thrombectomy   . Hemiplegia affecting left nondominant side (HCC)   . HLD (hyperlipidemia)   . Dysphagia, post-stroke   . Dysarthria, post-stroke   . Expressive aphasia   . Acute blood loss anemia   . Benign essential HTN   . Atrial fibrillation with RVR (HCC) 07/29/2016  . Osteoarthritis of right hip 08/22/2015  . 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    Lovvorn, Radene Journey MS, CCC-SLP February 20, 2017, 2:30 PM  [facility] [address] [place], [place], [address] Phone: [phone]   Fax:  [phone]  Name: Bren Borys MRN: [id number] Date of Birth: May 06, 1941

## 2017-02-03 ENCOUNTER — Ambulatory Visit: Payer: Medicare Other | Admitting: Speech Pathology

## 2017-02-03 DIAGNOSIS — R4701 Aphasia: Secondary | ICD-10-CM | POA: Diagnosis not present

## 2017-02-03 NOTE — Therapy (Signed)
[facility] [address] [place], [place], [address] Phone: [phone]   Fax:  [phone]  Speech Language Pathology Treatment  Patient Details  Name: Diana Bush MRN: [id number] Date of Birth: 1941-10-05 Referring Provider: Dr. Fleet ContrasEdwin Avbuere  Encounter Date: 02/03/2017      End of Session - 02/03/17 1212    Visit Number 2   Number of Visits 17   Date for SLP Re-Evaluation 04/01/17   SLP Start Time 1120   SLP Stop Time  1205   SLP Time Calculation (min) 45 min      Past Medical History:  Diagnosis Date  . Borderline hyperlipidemia   . Difficulty sleeping    DUE TO PAIN  . GERD (gastroesophageal reflux disease)   . Hypertension   . Neuromuscular disorder (HCC)   . Osteoarthritis   . PAF (paroxysmal atrial fibrillation) (HCC)   . Stroke [facility](HCC) 07/2016    Past Surgical History:  Procedure Laterality Date  . IR GENERIC HISTORICAL  07/28/2016   IR PERCUTANEOUS ART THROMBECTOMY/INFUSION INTRACRANIAL INC DIAG ANGIO 07/28/2016 [name], MD [facility]-INTERV RAD  . LYMPH NODE BIOPSY     BENIGN  . RADIOLOGY WITH ANESTHESIA N/A 07/28/2016   Procedure: RADIOLOGY WITH ANESTHESIA;  Surgeon: [name], MD;  Location: [facility] OR;  Service: Radiology;  Laterality: N/A;  . TOTAL HIP ARTHROPLASTY Right 08/22/2015   Procedure: RIGHT TOTAL HIP ARTHROPLASTY ANTERIOR APPROACH;  Surgeon: Kathryne Hitchhristopher Y Blackman, MD;  Location: [facility];  Service: Orthopedics;  Laterality: Right;    There were no vitals filed for this visit.      Subjective Assessment - 02/03/17 1122    Subjective "I did it out loud"   Patient is accompained by: Family member   Special Tests spouse   Currently in Pain? No/denies               ADULT SLP TREATMENT - 02/03/17 1125      General Information   Behavior/Cognition Alert;Cooperative;Pleasant mood     Treatment Provided   Treatment provided Cognitive-Linquistic     Pain Assessment    Pain Assessment No/denies pain     Cognitive-Linquistic Treatment   Treatment focused on Aphasia   Skilled Treatment Facilitated generation of subject(pronoun ) verb + ing and object with written carrier phrarses and photo action cards with usual mod A for use of articles before the object and occasional  min phonemic cues for object. With written carrier phraese (He is.Marland Kitchen.Marland Kitchen.Marland Kitchen.She is.Marland Kitchen.Marland Kitchen.Marland Kitchen.They are...) Pt succesfully generated simple sentence with occasional min A, however mod A for object function words. Pt unscrambled 3-4 word sentences aloud with rare min A and underline of 1st word. Provided sentence unscramble for homework.     Progression Toward Goals   Progression toward goals Progressing toward goals          SLP Education - 02/03/17 1209    Education provided Yes   Education Details schedule SCAT 30 min prior to appt time    Person(s) Educated Patient;Spouse   Methods Explanation   Comprehension Verbalized understanding          SLP Short Term Goals - 02/03/17 1211      SLP SHORT TERM GOAL #1   Title Pt will complete moderately complex naming tasks with 90% accuracy and occasional min A.   Time 4   Period Weeks   Status On-going     SLP SHORT TERM GOAL #2   Title Pt will generate grammatical  simple subject-verb-object  sentences with 80% accuracy and occasional min A.   Time 4   Period Weeks   Status On-going     SLP SHORT TERM GOAL #3   Title Pt will attend to the left side of the page when reading short 3-5 sentence paragraphs with occasional min A   Time 4   Period Weeks   Status On-going          SLP Long Term Goals - 02/03/17 1212      SLP LONG TERM GOAL #1   Title Pt will participate in simple 5 minute conversation with approprate grammar, subject verb agreement 80% of utterances and occasional min A   Time 8   Period Weeks   Status On-going     SLP LONG TERM GOAL #2   Title Pt will generate 2-3 sentence utterances in structured tasks with 85%  accuracy and occasional min A.   Time 8   Period Weeks   Status On-going          Plan - 02/03/17 1209    Clinical Impression Statement Pt arrived 20 minutes late with SCAT - encouraged them to schedule SCAT to arrive here at least 30 min prior to appintment time. Usual min to mod A for simple sentence production with carrier phrases and use of function words. After structured task, I noted use of some function words in simple conversation, however conversation remains halting and non fluent. Continue skilled ST to maximize verbal expression for QOL and to reduce caregiver burden.    Speech Therapy Frequency 2x / week   Treatment/Interventions Functional tasks;Patient/family education;Compensatory strategies;Cueing hierarchy;Multimodal communcation approach;Cognitive reorganization;Internal/external aids;SLP instruction and feedback;Language facilitation   Potential to Achieve Goals Fair   Potential Considerations Severity of impairments   Consulted and Agree with Plan of Care Patient;Family member/caregiver   Family Member Consulted spouse      Patient will benefit from skilled therapeutic intervention in order to improve the following deficits and impairments:   Aphasia    Problem List Patient Active Problem List   Diagnosis Date Noted  . Hypertensive emergency 08/04/2016  . Hypokalemia 08/04/2016  . Depression   . Cerebrovascular accident (CVA) due to thrombosis of right middle cerebral artery (HCC) s/p mechanical thrombectomy   . Hemiplegia affecting left nondominant side (HCC)   . HLD (hyperlipidemia)   . Dysphagia, post-stroke   . Dysarthria, post-stroke   . Expressive aphasia   . Acute blood loss anemia   . Benign essential HTN   . Atrial fibrillation with RVR (HCC) 07/29/2016  . Osteoarthritis of right hip 08/22/2015  . 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    Lovvorn, Radene Journey MS, CCC-SLP 02/03/2017, 12:13 PM  [facility] [address] [place], [place], [address] Phone: [phone]   Fax:  [phone]   Name: Diana Bush MRN: [id number] Date of Birth: December 25, 1940

## 2017-02-11 ENCOUNTER — Encounter: Payer: Self-pay | Admitting: Rehabilitation

## 2017-02-11 ENCOUNTER — Ambulatory Visit: Payer: Medicare Other | Admitting: Occupational Therapy

## 2017-02-11 ENCOUNTER — Ambulatory Visit: Payer: Medicare Other | Attending: Internal Medicine | Admitting: Rehabilitation

## 2017-02-11 DIAGNOSIS — R208 Other disturbances of skin sensation: Secondary | ICD-10-CM | POA: Diagnosis present

## 2017-02-11 DIAGNOSIS — M25612 Stiffness of left shoulder, not elsewhere classified: Secondary | ICD-10-CM

## 2017-02-11 DIAGNOSIS — I69318 Other symptoms and signs involving cognitive functions following cerebral infarction: Secondary | ICD-10-CM

## 2017-02-11 DIAGNOSIS — M25642 Stiffness of left hand, not elsewhere classified: Secondary | ICD-10-CM

## 2017-02-11 DIAGNOSIS — M6281 Muscle weakness (generalized): Secondary | ICD-10-CM | POA: Diagnosis present

## 2017-02-11 DIAGNOSIS — R2689 Other abnormalities of gait and mobility: Secondary | ICD-10-CM | POA: Diagnosis present

## 2017-02-11 DIAGNOSIS — M25512 Pain in left shoulder: Secondary | ICD-10-CM

## 2017-02-11 DIAGNOSIS — M25632 Stiffness of left wrist, not elsewhere classified: Secondary | ICD-10-CM

## 2017-02-11 DIAGNOSIS — I69354 Hemiplegia and hemiparesis following cerebral infarction affecting left non-dominant side: Secondary | ICD-10-CM | POA: Insufficient documentation

## 2017-02-11 DIAGNOSIS — G8929 Other chronic pain: Secondary | ICD-10-CM | POA: Insufficient documentation

## 2017-02-11 DIAGNOSIS — I69352 Hemiplegia and hemiparesis following cerebral infarction affecting left dominant side: Secondary | ICD-10-CM

## 2017-02-11 DIAGNOSIS — R293 Abnormal posture: Secondary | ICD-10-CM

## 2017-02-11 DIAGNOSIS — R29818 Other symptoms and signs involving the nervous system: Secondary | ICD-10-CM | POA: Insufficient documentation

## 2017-02-11 DIAGNOSIS — R4701 Aphasia: Secondary | ICD-10-CM | POA: Insufficient documentation

## 2017-02-11 NOTE — Therapy (Signed)
[facility] [address] [place], [place], [address] Phone: [phone]   Fax:  [phone]  Physical Therapy Evaluation  Patient Details  Name: Diana Bush MRN: [id number] Date of Birth: 03-09-1941 Referring Provider: [name], MD  Encounter Date: 02/11/2017      PT End of Session - 02/11/17 1959    Visit Number 1   Number of Visits 17   Date for PT Re-Evaluation 04/12/17   Authorization Type MCR-G code on every 10th visit   PT Start Time 1400   PT Stop Time 1500  did not bill for whole time with pt due to incontinence   PT Time Calculation (min) 60 min   Activity Tolerance Patient limited by pain;Other (comment)  limited by incontinence   Behavior During Therapy [facility] for tasks assessed/performed      Past Medical History:  Diagnosis Date  . Borderline hyperlipidemia   . Difficulty sleeping    DUE TO PAIN  . GERD (gastroesophageal reflux disease)   . Hypertension   . Neuromuscular disorder (HCC)   . Osteoarthritis   . PAF (paroxysmal atrial fibrillation) (HCC)   . Stroke [facility]) 07/2016    Past Surgical History:  Procedure Laterality Date  . IR GENERIC HISTORICAL  07/28/2016   IR PERCUTANEOUS ART THROMBECTOMY/INFUSION INTRACRANIAL INC DIAG ANGIO 07/28/2016 Julieanne Cotton, MD [facility]-INTERV RAD  . LYMPH NODE BIOPSY     BENIGN  . RADIOLOGY WITH ANESTHESIA N/A 07/28/2016   Procedure: RADIOLOGY WITH ANESTHESIA;  Surgeon: Julieanne Cotton, MD;  Location: [facility] OR;  Service: Radiology;  Laterality: N/A;  . TOTAL HIP ARTHROPLASTY Right 08/22/2015   Procedure: RIGHT TOTAL HIP ARTHROPLASTY ANTERIOR APPROACH;  Surgeon: Kathryne Hitch, MD;  Location: [facility] ORS;  Service: Orthopedics;  Laterality: Right;    There were no vitals filed for this visit.       Subjective Assessment - 02/11/17 1406    Subjective When asked what she would like to work on, she reports "speech and hand and leg."  Husband reports he  would like her to walk.    Patient is accompained by: Family member   Limitations House hold activities;Walking;Standing;Sitting   Patient Stated Goals "I want to walk and per husband strengthen L side."    Currently in Pain? Yes   Pain Score 3    Pain Location Knee   Pain Orientation Right;Left   Pain Descriptors / Indicators Aching   Pain Type Chronic pain   Pain Onset More than a month ago   Pain Frequency Intermittent   Aggravating Factors  bad weather    Pain Relieving Factors exercise            [facility] PT Assessment - 02/11/17 1411      Assessment   Medical Diagnosis R CVA (MCA)   Referring Provider [name], MD   Onset Date/Surgical Date 07/28/16   Hand Dominance Right   Prior Therapy acute then rehab at [facility] until Thanksgiving, then had HHPT/OT     Precautions   Precautions Fall   Precaution Comments aphasia     Balance Screen   Has the patient fallen in the past 6 months No   Has the patient had a decrease in activity level because of a fear of falling?  Yes   Is the patient reluctant to leave their home because of a fear of falling?  Yes     Home Environment   Living Environment Private residence   Living Arrangements Spouse/significant other  Available Help at Discharge Home health;Personal care attendant  11-4, 5 days per week has HH aide   Type of Home House   Home Access Ramped entrance  at back entrance   Home Layout One level   Home Equipment Wheelchair - manual;Hospital bed  hoyer lift, sits at EOB for bathing   Additional Comments HH aide assists with bathing/dressing, however she is able to assist with upper body     Prior Function   Level of Independence Independent   Vocation Retired   Gaffer Worked at [employer] read     Cognition   Overall Cognitive Status Impaired/Different from baseline   Area of Impairment Following commands;Problem solving   Following Commands Follows multi-step  commands inconsistently   Problem Solving Slow processing;Decreased initiation;Requires verbal cues;Requires tactile cues   Attention Selective     Observation/Other Assessments   Skin Integrity Small wound right buttock-per patient- home health nurse aware     Sensation   Light Touch Impaired Detail   Light Touch Impaired Details Impaired LUE;Impaired LLE   Hot/Cold Appears Intact   Proprioception Not tested     Coordination   Gross Motor Movements are Fluid and Coordinated No   Fine Motor Movements are Fluid and Coordinated No   Heel Shin Test unable to perform due to weakness/tightness     Posture/Postural Control   Posture/Postural Control Postural limitations   Postural Limitations Rounded Shoulders;Forward head;Decreased lumbar lordosis;Posterior pelvic tilt;Flexed trunk;Weight shift left     ROM / Strength   AROM / PROM / Strength PROM;Strength     PROM   Overall PROM  Deficits;Due to pain   Overall PROM Comments Pt with increased pain in RLE (knee) with all PROM, note decreased ROM in LLE due to tightness     Strength   Overall Strength Deficits   Overall Strength Comments R hip flex 2+/5 (seated), L hip flex 2-/5 (seated), R knee ext 3+/5 (pain), L knee ext 3+/5, R knee flex 3+/5 (pain), L knee flex 2/5, R ankle DF/PF 4/5, L ankle PF/DF 3/5     Bed Mobility   Bed Mobility Rolling Right;Rolling Left;Sit to Supine   Rolling Right 1: +1 Total assist   Rolling Right Details (indicate cue type and reason) Pt with severe perceptual deficits and resists rolling to the R.  cues for looking and turning towards direction turning.  Note that this was performed several times during session due to bowel and bladder incontinence (x 2).     Rolling Left 3: Mod assist  by hooking arms with PT   Rolling Left Details (indicate cue type and reason) Pt able to hook arms with PT and roll to the L with mod A   Sit to Supine 1: +2 Total assist   Sit to Supine - Details (indicate cue type  and reason) Pt with increased pain in R knee and increaesd pushing in sitting, therefore PT/OT assisted into supine.      Transfers   Transfers Lateral/Scoot Transfers   Lateral/Scoot Transfers With [employer];With armrests removed;1: +2 Total assist   Lateral Transfers: Patient Percentage 10%   Lateral/Scoot Transfer Details (indicate cue type and reason) Performed SB transfer w/c>mat.  Due to heavy pusher tendencies and resistance to forward trunk lean, pt requires +2 A to mat.  Pt with severe anxiety with movement and pain in R knee.  Cues for forward weight shift with assit for BLE management.  Due to time constraint, utilized  hoyer lift to get back to w/c.     Transfer via Oncologist     Ambulation/Gait   Ambulation/Gait No                             PT Short Term Goals - 02/11/17 2011      PT SHORT TERM GOAL #1   Title Pt/caregivers will demonstrate understanding of BLE stretching and strengthening HEP in order to indicate improved functional mobility.  (Target Date: 03/11/17)   Time 4   Period Weeks   Status New     PT SHORT TERM GOAL #2   Title Pt will roll to the R at max A level with mod cues for attending to R side when rolling in order to improve bed mobility.    Time 4   Period Weeks   Status New     PT SHORT TERM GOAL #3   Title Pt will roll to the L with min A in order to indicate improved bed mobility.     Time 4   Period Weeks   Status New     PT SHORT TERM GOAL #4   Title Pt will transition from SL<>sit with max A in order to indicate improved functional mobility.     Time 4   Period Weeks   Status New     PT SHORT TERM GOAL #5   Title Pt will perform static sitting balance with BUE/LE support at mod A level in order to improve funcitonal mobility and ADLs.    Time 4   Period Weeks   Status New     Additional Short Term Goals   Additional Short Term Goals Yes     PT SHORT TERM GOAL #6   Title Pt will perform SB  transfer to/from w/c at max A level in order to improve functional mobility with ADLs.    Time 4   Period Weeks   Status New     PT SHORT TERM GOAL #7   Title Pt will initiate forward trunk lean with mod A 75% of time in order to carryover to improved independence with transfers.    Time 4   Period Weeks   Status New           PT Long Term Goals - 02/11/17 2019      PT LONG TERM GOAL #1   Title Pt will perform rolling R at mod A level in order to indicate improved functional independence. (Target Date: 04/08/17)   Time 8   Period Weeks   Status New     PT LONG TERM GOAL #2   Title Pt will perform rolling to the L at S level in order to indicate improved functional independence.    Time 8   Period Weeks   Status New     PT LONG TERM GOAL #3   Title Pt will perform upper body/waist level RUE task while sitting with back unsupported at EOM (BLE supported) x 5 mins in order to indicate increased participation with ADLs.    Time 8   Period Weeks   Status New     PT LONG TERM GOAL #4   Title Pt will perform SB transfer at mod A level in order to indicate improved functional independence.     Time 8   Period Weeks   Status New     PT LONG TERM GOAL #5  Title Pt will perform SL<>sit at mod A level 50% of the time (with use of bed functions as needed) in order to indicate improved functional independence.    Time 8   Period Weeks   Status New     Additional Long Term Goals   Additional Long Term Goals Yes     PT LONG TERM GOAL #6   Title Pt will initiate forward trunk lean at S level 75% of the time in order to indicate improved carryover with transfers.     Time 8   Period Weeks   Status New               Plan - 02/13/2017 2001    Clinical Impression Statement Pt presents s/p R MCA CVA on 07/28/16 with resulting L hemiplegia.  She also demonstrates decreased balance, poor perception, generalized weakness in addtion to L hemiplegia, and decreased mobility.  Note  she has aphasia and is seeing SLP for this.  She arrived to session in w/c with personal hoyer lift pad in chair under her.  Husband is also w/c bound (does walk but falls often per his report) and they have caregivers 4 hrs/day 5 days/wk.  She is dependent with all aspects of mobility and utilizes hospital bed at home.  Note complex medical history that will limit progress on top of CVA deficits.  Upon PT evaluation , she requires total to +2 A for all mobility and demonstrates heavy pusher tendencies.  Pt is of evolving presentation and moderate complexity.   she will benefit from skilled OP neuro PT in order to address deficits, work on functional transfers, bed mobility and sitting balance.    Rehab Potential Fair   Clinical Impairments Affecting Rehab Potential severity of deficits, co-morbidities   PT Frequency 2x / week   PT Duration 8 weeks   PT Treatment/Interventions ADLs/Self Care Home Management;Functional mobility training;Therapeutic activities;Therapeutic exercise;Balance training;Neuromuscular re-education;Cognitive remediation;Patient/family education;Wheelchair mobility training;Passive range of motion   PT Next Visit Plan Stretching program for BLEs (esp if caregivers present), SB transfers as able (These are very time consuming and you will likely need +2A, heavy PUSHER), sitting balance, bed mobility, education on pressure relief (again, esp if caregiver present)   Consulted and Agree with Plan of Care Patient;Family member/caregiver   Family Member Consulted Husband      Patient will benefit from skilled therapeutic intervention in order to improve the following deficits and impairments:  Decreased activity tolerance, Decreased balance, Decreased cognition, Decreased coordination, Decreased endurance, Decreased mobility, Decreased range of motion, Decreased skin integrity, Decreased strength, Impaired perceived functional ability, Impaired flexibility, Impaired sensation, Impaired  UE functional use, Improper body mechanics, Postural dysfunction, Pain (will not address knee pain directly)  Visit Diagnosis: Hemiplegia and hemiparesis following cerebral infarction affecting left non-dominant side (HCC) - Plan: PT plan of care cert/re-cert  Abnormal posture - Plan: PT plan of care cert/re-cert  Muscle weakness (generalized) - Plan: PT plan of care cert/re-cert  Other abnormalities of gait and mobility - Plan: PT plan of care cert/re-cert      G-Codes - 02-13-2017 03/11/27    Functional Assessment Tool Used (Outpatient Only) max to total A for rolling, +2 A for sit>supine, +2A for SB transfers   Functional Limitation Changing and maintaining body position   Changing and Maintaining Body Position Current Status (Y7829) 100 percent impaired, limited or restricted   Changing and Maintaining Body Position Goal Status (F6213) At least 60 percent but less than 80 percent  impaired, limited or restricted       Problem List Patient Active Problem List   Diagnosis Date Noted  . Hypertensive emergency 08/04/2016  . Hypokalemia 08/04/2016  . Depression   . Cerebrovascular accident (CVA) due to thrombosis of right middle cerebral artery (HCC) s/p mechanical thrombectomy   . Hemiplegia affecting left nondominant side (HCC)   . HLD (hyperlipidemia)   . Dysphagia, post-stroke   . Dysarthria, post-stroke   . Expressive aphasia   . Acute blood loss anemia   . Benign essential HTN   . Atrial fibrillation with RVR (HCC) 07/29/2016  . Osteoarthritis of right hip 08/22/2015  . 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    Harriet Butte, PT, MPT [facility] [address] [place], [place], [address] Phone: [phone]   Fax:  [phone] 02/11/17, 8:31 PM  Name: Diana Bush MRN: [id number] Date of Birth: April 09, 1941

## 2017-02-11 NOTE — Therapy (Signed)
[facility] [address] [place], [place], [address] Phone: [phone]   Fax:  [phone]  Occupational Therapy Evaluation  Patient Details  Name: Diana Bush MRN: [id number] Date of Birth: 1941/03/27 Referring Provider: [name]  Encounter Date: 02/11/2017      OT End of Session - 02/11/17 1732    Visit Number 1   Number of Visits 17   Date for OT Re-Evaluation 04/12/17   Authorization Type Medicare- Progress note and G code every 10th visit   Authorization - Visit Number 1   Authorization - Number of Visits 10   OT Start Time 1530   OT Stop Time 1615   OT Time Calculation (min) 45 min   Activity Tolerance Patient tolerated treatment well   Behavior During Therapy [facility] for tasks assessed/performed      Past Medical History:  Diagnosis Date  . Borderline hyperlipidemia   . Difficulty sleeping    DUE TO PAIN  . GERD (gastroesophageal reflux disease)   . Hypertension   . Neuromuscular disorder (HCC)   . Osteoarthritis   . PAF (paroxysmal atrial fibrillation) (HCC)   . Stroke [facility]) 07/2016    Past Surgical History:  Procedure Laterality Date  . IR GENERIC HISTORICAL  07/28/2016   IR PERCUTANEOUS ART THROMBECTOMY/INFUSION INTRACRANIAL INC DIAG ANGIO 07/28/2016 Julieanne Cotton, MD [facility]-INTERV RAD  . LYMPH NODE BIOPSY     BENIGN  . RADIOLOGY WITH ANESTHESIA N/A 07/28/2016   Procedure: RADIOLOGY WITH ANESTHESIA;  Surgeon: Julieanne Cotton, MD;  Location: [facility] OR;  Service: Radiology;  Laterality: N/A;  . TOTAL HIP ARTHROPLASTY Right 08/22/2015   Procedure: RIGHT TOTAL HIP ARTHROPLASTY ANTERIOR APPROACH;  Surgeon: Kathryne Hitch, MD;  Location: [facility] ORS;  Service: Orthopedics;  Laterality: Right;    There were no vitals filed for this visit.      Subjective Assessment - 02/11/17 1540    Subjective  to walk, to (take care of myself)- supportive   Patient is accompained by: Family member  Husband  Thayer Ohm   Currently in Pain? No/denies   Pain Score 0-No pain           [facility] OT Assessment - 02/11/17 1542      Assessment   Diagnosis R MCA CVA   Referring Provider [name]   Onset Date 07/28/16   Prior Therapy [facility], Home health care     Precautions   Precautions Fall   Precaution Comments aphasia     Restrictions   Weight Bearing Restrictions No     Balance Screen   Has the patient fallen in the past 6 months No   Has the patient had a decrease in activity level because of a fear of falling?  Yes     Home  Environment   Family/patient expects to be discharged to: Private residence   Living Arrangements Spouse/significant other   Available Help at Discharge --  home care 4-5 hrs/ 5 days / week- grandson - Kian grandson   Type of Home House   Home Layout One level   Home Equipment Bedside commode     Prior Function   Level of Independence Independent with basic ADLs   Vocation Retired   Gaffer worked at [employer] -    Leisure enjoys reading, sewing      ADL   Eating/Feeding Set up   Grooming Minimal assistance   Upper Body Bathing Moderate assistance   Lower Body Bathing Maximal assistance  Upper Body Dressing Maximal assistance   Lower Body Dressing +1 Total aassistance   Toilet Tranfer + 2 Total assistance;+ 1 Total assistance   Toileting - Clothing Manipulation + 1 Total assistance   Toileting -  Hygiene + 2 Total Writer Other (comment)   Tub/Shower Transfer: Patient Percentage 0%   Tub/Shower Transfer Method --  Does not shower   ADL comments Patient has a portable commode     IADL   Prior Level of Function Shopping independent   Shopping Completely unable to shop   Prior Level of Function Light Housekeeping independent   Light Housekeeping Does not participate in any housekeeping tasks   Prior Level of Function Meal Prep independent   Meal Prep Needs to have meals prepared and  served   Prior Level of Function Meal Prep independent     Written Expression   Dominant Hand Right     Vision - History   Baseline Vision No visual deficits   Patient Visual Report Unable to keep objects in focus     Vision Assessment   Eye Alignment Within Functional Limits   Ocular Range of Motion Within Functional Limits   Alignment/Gaze Preference Within Defined Limits   Tracking/Visual Pursuits Able to track stimulus in all quads without difficulty     Cognition   Overall Cognitive Status Impaired/Different from baseline   Area of Impairment Memory;Following commands;Problem solving   Memory Decreased short-term memory   Following Commands Follows one step commands consistently;Follows multi-step commands inconsistently   Problem Solving Slow processing;Decreased initiation;Requires verbal cues   Attention Selective     Observation/Other Assessments   Observations Skilled clinical judgement   Skin Integrity Small wound right buttock-per patient- home health nurse aware   Focus on Therapeutic Outcomes (FOTO)  Incomplete intake     Sensation   Light Touch Impaired by gross assessment   Light Touch Impaired Details Impaired LUE  tingling   Stereognosis Not tested   Hot/Cold Not tested   Proprioception Not tested     Coordination   Gross Motor Movements are Fluid and Coordinated No   Fine Motor Movements are Fluid and Coordinated No   Coordination and Movement Description No active movement noted in left arm during assessment     Perception   Perception Impaired   Spatial Orientation Severe contraversive pushing behavior     Praxis   Praxis Impaired     ROM / Strength   AROM / PROM / Strength PROM     PROM   Overall PROM  Deficits;Due to pain  tight shoulder capsule   Overall PROM Comments Patient limits passive movement when she feels increased tingling     Hand Function   Left Hand Gross Grasp Impaired   Left Hand Grip (lbs) unable   Comment No active  movement noted in left hand                         OT Education - 02/11/17 1730    Education provided Yes   Education Details reviewed evaluation findings and potential goals for OT, asked patient to bring extra brief and change of clothes for subsequent therapy sessions   Person(s) Educated Patient;Spouse   Methods Explanation   Comprehension Need further instruction;Verbalized understanding          OT Short Term Goals - 02/11/17 1740      OT SHORT TERM GOAL #1   Title Patient/caregiver will  demonstrate competence with passive range of motion program for left arm due 03/12/2017   Time 4   Period Weeks   Status New     OT SHORT TERM GOAL #2   Title Patient will shift trunk forward in wheelchair with minimal assitance in preparation for a level surface transfer    Time 4   Period Weeks   Status New     OT SHORT TERM GOAL #3   Title Patient will reach below knees to mid calf with min assist in preparation for assist with lower body dressing   Time 4   Period Weeks   Status New     OT SHORT TERM GOAL #4   Title With mod assist, patient will scoot one hip forward on level surface in preparation for bedside commode transfer   Time 4   Period Weeks   Status New     OT SHORT TERM GOAL #5   Title Patient will tolerate resting hand splint and wear 1-2 hours at a time during the day   Time 4   Period Weeks   Status New           OT Long Term Goals - 02/11/17 1744      OT LONG TERM GOAL #1   Title Patient will lean trunk forward from wheelchair back without assistance in preparation for a transfer to drop arm commode due 04/12/2017   Time 8   Period Weeks   Status New     OT LONG TERM GOAL #2   Title Patient will scoot both hips forward on level surface in preparation for transfer to drop arm commode   Time 8   Period Weeks   Status New     OT LONG TERM GOAL #3   Title Patient will demonstrate passive range of motion in left shoulder to at least  110 degrees of flexion or abduction with pain no greater than 2/10 to aide in upper body dressing   Time 8   Period Weeks   Status New     OT LONG TERM GOAL #4   Title Patient will demonstrate sufficient hip flexion to weight shift forward while seated to reach to feet as needed for lower body bathing and dressing   Time 8   Period Weeks   Status New     OT LONG TERM GOAL #5   Title Patient will stand with max assist to aide caregivers with toilet hygiene and clothing management    Time 8   Period Weeks   Status New               Plan - 02/11/17 1733    Clinical Impression Statement Patient is a 76 year old female, referred for OP OT services following a 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  Patient is currently dependent upon paid and family caregivers for all aspects of basic self care.  She is wheelchair dependent, and caregivers need hoyer lift for all transfers at this time.  Patient is incontinent due to severely impaired functional mobility.  Patient lives with disabled husband- who has frequent falls, and often uses wheelchair for safety.  Patient will benefit from skilled OT intervention to decrease her fear of mobility, to improve her ability to participate in her own self care, to improve her shoulder pain (left) and to decrease  overall caregiver burden.     Rehab Potential Fair   Clinical Impairments Affecting Rehab Potential severity of deficits   OT Frequency 2x / week   OT Duration 8 weeks   OT Treatment/Interventions Self-care/ADL training;Therapeutic exercise;Functional Mobility Training;Patient/family education;Balance training;Splinting;Manual Therapy;Neuromuscular education;Ultrasound;Therapeutic activities;DME and/or AE instruction;Electrical Stimulation;Cognitive  remediation/compensation;Visual/perceptual remediation/compensation;Passive range of motion;Moist Heat   Plan Fabricate resting hand splint, or begin training steps for level surface transfer- leaning forward in wheelchair with feet on floor.     Consulted and Agree with Plan of Care Patient;Family member/caregiver   Family Member Consulted Husband Thayer Ohm      Patient will benefit from skilled therapeutic intervention in order to improve the following deficits and impairments:  Decreased activity tolerance, Decreased balance, Decreased cognition, Decreased coordination, Decreased safety awareness, Decreased range of motion, Decreased mobility, Decreased knowledge of use of DME, Decreased knowledge of precautions, Decreased endurance, Decreased skin integrity, Decreased strength, Impaired tone, Impaired UE functional use, Other (comment), Pain, Obesity, Impaired sensation, Improper spinal/pelvic alignment, Improper body mechanics, Impaired perceived functional ability, Increased muscle spasms, Impaired vision/preception  Visit Diagnosis: 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) - Plan: Ot plan of care cert/re-cert  Chronic left shoulder pain - Plan: Ot plan of care cert/re-cert  Abnormal posture - Plan: Ot plan of care cert/re-cert  Other disturbances of skin sensation - Plan: Ot plan of care cert/re-cert  Other symptoms and signs involving the nervous system - Plan: Ot plan of care cert/re-cert  Other symptoms and signs involving cognitive functions following cerebral infarction - Plan: Ot plan of care cert/re-cert  Stiffness of left shoulder, not elsewhere classified - Plan: Ot plan of care cert/re-cert  Stiffness of left wrist, not elsewhere classified - Plan: Ot plan of care cert/re-cert  Stiffness of left hand, not elsewhere classified - Plan: Ot plan of care cert/re-cert      G-Codes - 02/15/17 1753    Functional Limitation Self  care   Self Care Current Status [phone]) At least 80 percent but less than 100 percent impaired, limited or restricted   Self Care Goal Status ([id number]) At least 60 percent but less than 80 percent impaired, limited or restricted      Problem List Patient Active Problem List   Diagnosis Date Noted  . Hypertensive emergency 08/04/2016  . Hypokalemia 08/04/2016  . Depression   . Cerebrovascular accident (CVA) due to thrombosis of right middle cerebral artery (HCC) s/p mechanical thrombectomy   . Hemiplegia affecting left nondominant side (HCC)   . HLD (hyperlipidemia)   . Dysphagia, post-stroke   . Dysarthria, post-stroke   . Expressive aphasia   . Acute blood loss anemia   . Benign essential HTN   . Atrial fibrillation with RVR (HCC) 07/29/2016  . Osteoarthritis of right hip 08/22/2015  . 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    Collier Salina, OTR/L 02/15/17, 5:55 PM  [facility] [address] [place], [place], [address] Phone: [phone]   Fax:  [phone]  Name: Diana Bush MRN: [id number] Date of Birth: May 01, 1941

## 2017-02-16 ENCOUNTER — Encounter: Payer: Self-pay | Admitting: Occupational Therapy

## 2017-02-16 ENCOUNTER — Ambulatory Visit: Payer: Medicare Other

## 2017-02-16 ENCOUNTER — Encounter: Payer: Self-pay | Admitting: Physical Therapy

## 2017-02-16 ENCOUNTER — Ambulatory Visit: Payer: Medicare Other | Admitting: Physical Therapy

## 2017-02-16 ENCOUNTER — Ambulatory Visit: Payer: Medicare Other | Admitting: Occupational Therapy

## 2017-02-16 DIAGNOSIS — I69318 Other symptoms and signs involving cognitive functions following cerebral infarction: Secondary | ICD-10-CM

## 2017-02-16 DIAGNOSIS — I69352 Hemiplegia and hemiparesis following cerebral infarction affecting left dominant side: Secondary | ICD-10-CM

## 2017-02-16 DIAGNOSIS — R29818 Other symptoms and signs involving the nervous system: Secondary | ICD-10-CM

## 2017-02-16 DIAGNOSIS — G8929 Other chronic pain: Secondary | ICD-10-CM

## 2017-02-16 DIAGNOSIS — M25632 Stiffness of left wrist, not elsewhere classified: Secondary | ICD-10-CM

## 2017-02-16 DIAGNOSIS — M25512 Pain in left shoulder: Secondary | ICD-10-CM

## 2017-02-16 DIAGNOSIS — R4701 Aphasia: Secondary | ICD-10-CM

## 2017-02-16 DIAGNOSIS — R293 Abnormal posture: Secondary | ICD-10-CM

## 2017-02-16 DIAGNOSIS — I69354 Hemiplegia and hemiparesis following cerebral infarction affecting left non-dominant side: Secondary | ICD-10-CM | POA: Diagnosis not present

## 2017-02-16 DIAGNOSIS — M25642 Stiffness of left hand, not elsewhere classified: Secondary | ICD-10-CM

## 2017-02-16 DIAGNOSIS — R208 Other disturbances of skin sensation: Secondary | ICD-10-CM

## 2017-02-16 DIAGNOSIS — M25612 Stiffness of left shoulder, not elsewhere classified: Secondary | ICD-10-CM

## 2017-02-16 NOTE — Therapy (Signed)
[facility] [address] [place], [place], [address] Phone: [phone]   Fax:  [phone]  Speech Language Pathology Treatment  Patient Details  Name: Diana Bush MRN: [id number] Date of Birth: Jul 12, 1941 Referring Provider: Dr. [name]  Encounter Date: 02/16/2017      End of Session - 02/16/17 1128    Visit Number 3   Number of Visits 17   SLP Start Time 1018   SLP Stop Time  1100   SLP Time Calculation (min) 42 min   Activity Tolerance Patient tolerated treatment well      Past Medical History:  Diagnosis Date  . Borderline hyperlipidemia   . Difficulty sleeping    DUE TO PAIN  . GERD (gastroesophageal reflux disease)   . Hypertension   . Neuromuscular disorder (HCC)   . Osteoarthritis   . PAF (paroxysmal atrial fibrillation) (HCC)   . Stroke [facility]) 07/2016    Past Surgical History:  Procedure Laterality Date  . IR GENERIC HISTORICAL  07/28/2016   IR PERCUTANEOUS ART THROMBECTOMY/INFUSION INTRACRANIAL INC DIAG ANGIO 07/28/2016 Julieanne Cotton, MD [facility]-INTERV RAD  . LYMPH NODE BIOPSY     BENIGN  . RADIOLOGY WITH ANESTHESIA N/A 07/28/2016   Procedure: RADIOLOGY WITH ANESTHESIA;  Surgeon: Julieanne Cotton, MD;  Location: [facility] OR;  Service: Radiology;  Laterality: N/A;  . TOTAL HIP ARTHROPLASTY Right 08/22/2015   Procedure: RIGHT TOTAL HIP ARTHROPLASTY ANTERIOR APPROACH;  Surgeon: Kathryne Hitch, MD;  Location: [facility] ORS;  Service: Orthopedics;  Laterality: Right;    There were no vitals filed for this visit.      Subjective Assessment - 02/16/17 1024    Subjective PEople - and - artic-ulate -- rambling. (pt, re: "why are you in ST?")   Patient is accompained by: Family member  husband   Currently in Pain? Yes   Pain Score 4    Pain Location Knee   Pain Orientation Right   Pain Descriptors / Indicators Aching   Pain Type Chronic pain   Pain Onset More than a month ago   Pain Frequency  Constant   Aggravating Factors  weather   Pain Relieving Factors weather               ADULT SLP TREATMENT - 02/16/17 1027      General Information   Behavior/Cognition Alert;Cooperative;Pleasant mood     Treatment Provided   Treatment provided Cognitive-Linquistic     Cognitive-Linquistic Treatment   Treatment focused on Aphasia   Skilled Treatment Pt with significant left inattention complicating reading tasks during therapy. SLP with consistent cues necessary to look to lt, sometimes mod-max cues needed. In simple sentence (subject+is+"ing" verb + object) pt was 25% successful, and this improved to 90% with mod-max verbal and visual cues.      Assessment / Recommendations / Plan   Plan Continue with current plan of care     Progression Toward Goals   Progression toward goals Progressing toward goals            SLP Short Term Goals - 02/16/17 1138      SLP SHORT TERM GOAL #1   Title Pt will complete moderately complex naming tasks with 90% accuracy and occasional min A.   Time 3   Period Weeks   Status On-going     SLP SHORT TERM GOAL #2   Title Pt will generate grammatical  simple subject-verb-object sentences with 80% accuracy and occasional min A.   Time 3  Period Weeks   Status On-going     SLP SHORT TERM GOAL #3   Title Pt will attend to the left side of the page when reading short 3-5 sentence paragraphs with occasional min A   Time 3   Period Weeks   Status On-going          SLP Long Term Goals - 02/16/17 1138      SLP LONG TERM GOAL #1   Title Pt will participate in simple 5 minute conversation with approprate grammar, subject verb agreement 80% of utterances and occasional min A   Time 7   Period Weeks   Status On-going     SLP LONG TERM GOAL #2   Title Pt will generate 2-3 sentence utterances in structured tasks with 85% accuracy and occasional min A.   Time 7   Period Weeks   Status On-going          Plan - 02/16/17 1128     Clinical Impression Statement SLP again provided A for simple sentence production (He/she is ___ing the ___"). Conversation remained halting and non fluent, with some functional content words - nearly functional for wants/needs. Pt's lt inattention made reading tasks difficult. Continue skilled ST to maximize verbal expression for QOL and to reduce caregiver burden.    Speech Therapy Frequency 2x / week   Treatment/Interventions Functional tasks;Patient/family education;Compensatory strategies;Cueing hierarchy;Multimodal communcation approach;Cognitive reorganization;Internal/external aids;SLP instruction and feedback;Language facilitation   Potential to Achieve Goals Fair   Potential Considerations Severity of impairments   Consulted and Agree with Plan of Care Patient;Family member/caregiver   Family Member Consulted spouse      Patient will benefit from skilled therapeutic intervention in order to improve the following deficits and impairments:   Aphasia    Problem List Patient Active Problem List   Diagnosis Date Noted  . Hypertensive emergency 08/04/2016  . Hypokalemia 08/04/2016  . Depression   . Cerebrovascular accident (CVA) due to thrombosis of right middle cerebral artery (HCC) s/p mechanical thrombectomy   . Hemiplegia affecting left nondominant side (HCC)   . HLD (hyperlipidemia)   . Dysphagia, post-stroke   . Dysarthria, post-stroke   . Expressive aphasia   . Acute blood loss anemia   . Benign essential HTN   . Atrial fibrillation with RVR (HCC) 07/29/2016  . Osteoarthritis of right hip 08/22/2015  . 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    [facility] ,MS, CCC-SLP  02/16/2017, 11:38 AM  [facility] [address] [place], [place], [address] Phone: [phone]   Fax:  [phone]   Name: Diana Bush MRN: [id number] Date of Birth: 1941/04/11

## 2017-02-16 NOTE — Patient Instructions (Signed)
Continue your homework from last time with Vernona Rieger.

## 2017-02-16 NOTE — Therapy (Signed)
[facility] [address] [place], [place], [address] Phone: [phone]   Fax:  [phone]  Occupational Therapy Treatment  Patient Details  Name: Diana Bush MRN: [id number] Date of Birth: [birth year] Referring Provider: [name]  Encounter Date: 02/16/2017      OT End of Session - 02/16/17 1048    Visit Number 2   Number of Visits 17   Date for OT Re-Evaluation 04/12/17   Authorization Type Medicare- Progress note and G code every 10th visit   Authorization - Visit Number 2   Authorization - Number of Visits 10   OT Start Time 0935   OT Stop Time 1015   OT Time Calculation (min) 40 min   Activity Tolerance Patient tolerated treatment well   Behavior During Therapy [facility] for tasks assessed/performed      Past Medical History:  Diagnosis Date  . Borderline hyperlipidemia   . Difficulty sleeping    DUE TO PAIN  . GERD (gastroesophageal reflux disease)   . Hypertension   . Neuromuscular disorder (HCC)   . Osteoarthritis   . PAF (paroxysmal atrial fibrillation) (HCC)   . Stroke [facility]) 07/2016    Past Surgical History:  Procedure Laterality Date  . IR GENERIC HISTORICAL  07/28/2016   IR PERCUTANEOUS ART THROMBECTOMY/INFUSION INTRACRANIAL INC DIAG ANGIO 07/28/2016 Julieanne Cotton, MD [facility]-INTERV RAD  . LYMPH NODE BIOPSY     BENIGN  . RADIOLOGY WITH ANESTHESIA N/A 07/28/2016   Procedure: RADIOLOGY WITH ANESTHESIA;  Surgeon: Julieanne Cotton, MD;  Location: [facility] OR;  Service: Radiology;  Laterality: N/A;  . TOTAL HIP ARTHROPLASTY Right 08/22/2015   Procedure: RIGHT TOTAL HIP ARTHROPLASTY ANTERIOR APPROACH;  Surgeon: Kathryne Hitch, MD;  Location: [facility] ORS;  Service: Orthopedics;  Laterality: Right;    There were no vitals filed for this visit.                    OT Treatments/Exercises (OP) - 02/16/17 0001      Neurological Re-education Exercises   Other Exercises 1 Neuromuscular  reeducation to address postural control in sitting.  Worked to actively weight shift toward right side, encouraging right lateral flexion.  Encouraged forward weight shift.  Patient with severe perceptual deficits, and does best moving toward target.  Patient actively resists passive movement.  Need continued focus on weight shifts forward and toward right side to aide with active transfers and ADL.                 OT Education - 02/16/17 1046    Education provided Yes   Education Details The importance of actively flexing forward to assist with transfer readiness, sitting balance   Person(s) Educated Patient;Spouse   Methods Explanation;Demonstration   Comprehension Need further instruction;Verbalized understanding          OT Short Term Goals - 02/16/17 1058      OT SHORT TERM GOAL #1   Title Patient/caregiver will demonstrate competence with passive range of motion program for left arm due 03/12/2017   Time 4   Period Weeks   Status On-going     OT SHORT TERM GOAL #2   Title Patient will shift trunk forward in wheelchair with minimal assitance in preparation for a level surface transfer    Time 4   Period Weeks   Status On-going     OT SHORT TERM GOAL #3   Title Patient will reach below knees to mid calf with min assist in  preparation for assist with lower body dressing   Time 4   Period Weeks   Status On-going     OT SHORT TERM GOAL #4   Title With mod assist, patient will scoot one hip forward on level surface in preparation for bedside commode transfer   Time 4   Period Weeks   Status On-going     OT SHORT TERM GOAL #5   Title Patient will tolerate resting hand splint and wear 1-2 hours at a time during the day   Time 4   Period Weeks   Status On-going           OT Long Term Goals - 02/16/17 1059      OT LONG TERM GOAL #1   Title Patient will lean trunk forward from wheelchair back without assistance in preparation for a transfer to drop arm commode  due 04/12/2017   Time 8   Period Weeks   Status On-going     OT LONG TERM GOAL #2   Title Patient will scoot both hips forward on level surface in preparation for transfer to drop arm commode   Time 8   Period Weeks   Status On-going     OT LONG TERM GOAL #3   Title Patient will demonstrate passive range of motion in left shoulder to at least 110 degrees of flexion or abduction with pain no greater than 2/10 to aide in upper body dressing   Time 8   Period Weeks   Status On-going     OT LONG TERM GOAL #4   Title Patient will demonstrate sufficient hip flexion to weight shift forward while seated to reach to feet as needed for lower body bathing and dressing   Time 8   Period Weeks   Status On-going     OT LONG TERM GOAL #5   Title Patient will stand with max assist to aide caregivers with toilet hygiene and clothing management    Time 8   Period Weeks   Status On-going               Plan - 02/16/17 1056    Clinical Impression Statement Patient with severe perceptual deficits, and very fearfulof movement.  patient with significant pain in R knee- felt to be arthritis, as well as overactivation of RLE to push toward weaker side.  Patient responding well to gentle approach- establishing trust will be key for forward progression with all aspects of functional mobility / ADL.     Rehab Potential Fair   Clinical Impairments Affecting Rehab Potential severity of deficits   OT Frequency 2x / week   OT Duration 8 weeks   OT Treatment/Interventions Self-care/ADL training;Therapeutic exercise;Functional Mobility Training;Patient/family education;Balance training;Splinting;Manual Therapy;Neuromuscular education;Ultrasound;Therapeutic activities;DME and/or AE instruction;Electrical Stimulation;Cognitive remediation/compensation;Visual/perceptual remediation/compensation;Passive range of motion;Moist Heat   Plan Level surface active transfer - stages, forward weight shift, scooting from  w/c, needs resting hand splint   Consulted and Agree with Plan of Care Patient;Family member/caregiver   Family Member Consulted Husband Thayer Ohm      Patient will benefit from skilled therapeutic intervention in order to improve the following deficits and impairments:  Decreased activity tolerance, Decreased balance, Decreased cognition, Decreased coordination, Decreased safety awareness, Decreased range of motion, Decreased mobility, Decreased knowledge of use of DME, Decreased knowledge of precautions, Decreased endurance, Decreased skin integrity, Decreased strength, Impaired tone, Impaired UE functional use, Other (comment), Pain, Obesity, Impaired sensation, Improper spinal/pelvic alignment, Improper body mechanics, Impaired perceived functional ability, Increased  muscle spasms, Impaired vision/preception  Visit Diagnosis: 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)  Chronic left shoulder pain  Abnormal posture  Other disturbances of skin sensation  Other symptoms and signs involving the nervous system  Other symptoms and signs involving cognitive functions following cerebral infarction  Stiffness of left shoulder, not elsewhere classified  Stiffness of left wrist, not elsewhere classified  Stiffness of left hand, not elsewhere classified    Problem List Patient Active Problem List   Diagnosis Date Noted  . Hypertensive emergency 08/04/2016  . Hypokalemia 08/04/2016  . Depression   . Cerebrovascular accident (CVA) due to thrombosis of right middle cerebral artery (HCC) s/p mechanical thrombectomy   . Hemiplegia affecting left nondominant side (HCC)   . HLD (hyperlipidemia)   . Dysphagia, post-stroke   . Dysarthria, post-stroke   . Expressive aphasia   . Acute blood loss anemia   . Benign essential HTN   . Atrial fibrillation with RVR (HCC) 07/29/2016  . Osteoarthritis of right hip 08/22/2015  . Status post total  replacement of right hip 08/22/2015    Collier Salina 02/16/2017, 11:00 AM  [place] [address] [place], [place], [address] Phone: [phone]   Fax:  [phone]  Name: Diana Bush MRN: [id number] Date of Birth: 25-Nov-1940

## 2017-02-18 ENCOUNTER — Ambulatory Visit: Payer: Medicare Other

## 2017-02-18 ENCOUNTER — Ambulatory Visit: Payer: Medicare Other | Admitting: Rehabilitation

## 2017-02-18 ENCOUNTER — Ambulatory Visit: Payer: Medicare Other | Admitting: Occupational Therapy

## 2017-02-18 NOTE — Therapy (Signed)
[facility] [address] [place], [place], [address] Phone: [phone]   Fax:  [phone]  Patient Details  Name: Diana Bush MRN: [id number] Date of Birth: 1940/12/05 Referring Provider:  [name], MD  Encounter Date: 02/16/2017  Patient visit changed from PT to OT visit (see OT progress note 02/16/17).    Zena Amos, PT 02/18/2017, 7:43 AM  [facility] [address] [place], [place], [address] Phone: [phone]   Fax:  [phone]

## 2017-02-22 ENCOUNTER — Encounter: Payer: Self-pay | Admitting: Occupational Therapy

## 2017-02-22 ENCOUNTER — Ambulatory Visit: Payer: Self-pay | Admitting: Physical Therapy

## 2017-02-24 ENCOUNTER — Ambulatory Visit: Payer: Medicare Other

## 2017-02-24 ENCOUNTER — Ambulatory Visit: Payer: Medicare Other | Admitting: Rehabilitation

## 2017-02-24 ENCOUNTER — Ambulatory Visit: Payer: Medicare Other | Admitting: Occupational Therapy

## 2017-03-01 ENCOUNTER — Ambulatory Visit: Payer: Medicare Other | Admitting: Speech Pathology

## 2017-03-01 ENCOUNTER — Encounter: Payer: Self-pay | Admitting: Occupational Therapy

## 2017-03-01 ENCOUNTER — Encounter: Payer: Self-pay | Admitting: Physical Therapy

## 2017-03-01 ENCOUNTER — Ambulatory Visit: Payer: Medicare Other | Admitting: Physical Therapy

## 2017-03-01 ENCOUNTER — Ambulatory Visit: Payer: Medicare Other | Admitting: Occupational Therapy

## 2017-03-01 DIAGNOSIS — I69354 Hemiplegia and hemiparesis following cerebral infarction affecting left non-dominant side: Secondary | ICD-10-CM | POA: Diagnosis not present

## 2017-03-01 DIAGNOSIS — I69352 Hemiplegia and hemiparesis following cerebral infarction affecting left dominant side: Secondary | ICD-10-CM

## 2017-03-01 DIAGNOSIS — R2689 Other abnormalities of gait and mobility: Secondary | ICD-10-CM

## 2017-03-01 DIAGNOSIS — R29818 Other symptoms and signs involving the nervous system: Secondary | ICD-10-CM

## 2017-03-01 DIAGNOSIS — R293 Abnormal posture: Secondary | ICD-10-CM

## 2017-03-01 DIAGNOSIS — M6281 Muscle weakness (generalized): Secondary | ICD-10-CM

## 2017-03-01 DIAGNOSIS — R4701 Aphasia: Secondary | ICD-10-CM

## 2017-03-01 DIAGNOSIS — I69318 Other symptoms and signs involving cognitive functions following cerebral infarction: Secondary | ICD-10-CM

## 2017-03-01 DIAGNOSIS — G8929 Other chronic pain: Secondary | ICD-10-CM

## 2017-03-01 DIAGNOSIS — M25612 Stiffness of left shoulder, not elsewhere classified: Secondary | ICD-10-CM

## 2017-03-01 DIAGNOSIS — M25632 Stiffness of left wrist, not elsewhere classified: Secondary | ICD-10-CM

## 2017-03-01 DIAGNOSIS — M25512 Pain in left shoulder: Secondary | ICD-10-CM

## 2017-03-01 DIAGNOSIS — M25642 Stiffness of left hand, not elsewhere classified: Secondary | ICD-10-CM

## 2017-03-01 DIAGNOSIS — R208 Other disturbances of skin sensation: Secondary | ICD-10-CM

## 2017-03-01 NOTE — Therapy (Signed)
[facility] [address] [place], [place], [address] Phone: [phone]   Fax:  [phone]  Physical Therapy Treatment  Patient Details  Name: Diana Bush MRN: [id number] Date of Birth: [birth year] Referring Provider: [name], MD  Encounter Date: 03/01/2017      PT End of Session - 03/01/17 2023    Visit Number 2   Number of Visits 17   Date for PT Re-Evaluation 04/12/17   Authorization Type MCR-G code on every 10th visit   PT Start Time 1414   PT Stop Time 1446   PT Time Calculation (min) 32 min   Activity Tolerance Patient limited by pain;Other (comment)  limited by incontinence   Behavior During Therapy [facility] for tasks assessed/performed      Past Medical History:  Diagnosis Date  . Borderline hyperlipidemia   . Difficulty sleeping    DUE TO PAIN  . GERD (gastroesophageal reflux disease)   . Hypertension   . Neuromuscular disorder (HCC)   . Osteoarthritis   . PAF (paroxysmal atrial fibrillation) (HCC)   . Stroke [facility] 07/2016    Past Surgical History:  Procedure Laterality Date  . IR GENERIC HISTORICAL  07/28/2016   IR PERCUTANEOUS ART THROMBECTOMY/INFUSION INTRACRANIAL INC DIAG ANGIO 07/28/2016 Julieanne Cotton, MD [facility]-INTERV RAD  . LYMPH NODE BIOPSY     BENIGN  . RADIOLOGY WITH ANESTHESIA N/A 07/28/2016   Procedure: RADIOLOGY WITH ANESTHESIA;  Surgeon: Julieanne Cotton, MD;  Location: [facility] OR;  Service: Radiology;  Laterality: N/A;  . TOTAL HIP ARTHROPLASTY Right 08/22/2015   Procedure: RIGHT TOTAL HIP ARTHROPLASTY ANTERIOR APPROACH;  Surgeon: Kathryne Hitch, MD;  Location: [facility];  Service: Orthopedics;  Laterality: Right;    There were no vitals filed for this visit.      Subjective Assessment - 03/01/17 1527    Subjective Reports she has been having more knee pain (Rt>lt) recently. Showed she has pain patches on her knees (covered by gauze and tape). Thinks moving her knees makes  them feel better.   Patient is accompained by: Family member  husband, Thayer Ohm   Limitations House hold activities;Walking;Standing;Sitting   Patient Stated Goals "I want to walk and per husband strengthen L side."    Currently in Pain? Yes   Pain Score 9    Pain Location Knee   Pain Orientation Right   Pain Descriptors / Indicators Aching   Pain Type Chronic pain   Pain Onset More than a month ago   Pain Frequency Constant                         [facility] Adult PT Treatment/Exercise - 03/01/17 1529      Transfers   Comments Pt arrived late for appointment and had SLP appt after PT, therefore abbreviated session completed. Worked on pre-transfer components of movement to aid with scoot or sliding board transfers     Neuro Re-ed    Neuro Re-ed Details  Patient tends to sit in w/c in posterior pelvic tilt. Required incr time and effort to work towards scooting out to edge of seat and placing feet on the floor (pt wants to push torso back against seat to push her hips forward). Worked on anterior and rt wt-shift for advancing Lt hip. Once seated EOC, vc for proper foot placement for stability. Again worked on weight-shifting anterior and to her right with repeated reaching activities with RUE due to pt tending to push towards her  weaker Lt side.                 PT Education - 03/01/17 2022    Education provided Yes   Education Details reason for working on weightshifting forward and to the right;    Person(s) Educated Patient   Methods Explanation;Demonstration;Tactile cues;Verbal cues   Comprehension Verbalized understanding;Verbal cues required;Tactile cues required;Need further instruction;Returned demonstration          PT Short Term Goals - 02/11/17 2011      PT SHORT TERM GOAL #1   Title Pt/caregivers will demonstrate understanding of BLE stretching and strengthening HEP in order to indicate improved functional mobility.  (Target Date: 03/11/17)   Time 4    Period Weeks   Status New     PT SHORT TERM GOAL #2   Title Pt will roll to the R at max A level with mod cues for attending to R side when rolling in order to improve bed mobility.    Time 4   Period Weeks   Status New     PT SHORT TERM GOAL #3   Title Pt will roll to the L with min A in order to indicate improved bed mobility.     Time 4   Period Weeks   Status New     PT SHORT TERM GOAL #4   Title Pt will transition from SL<>sit with max A in order to indicate improved functional mobility.     Time 4   Period Weeks   Status New     PT SHORT TERM GOAL #5   Title Pt will perform static sitting balance with BUE/LE support at mod A level in order to improve funcitonal mobility and ADLs.    Time 4   Period Weeks   Status New     Additional Short Term Goals   Additional Short Term Goals Yes     PT SHORT TERM GOAL #6   Title Pt will perform SB transfer to/from w/c at max A level in order to improve functional mobility with ADLs.    Time 4   Period Weeks   Status New     PT SHORT TERM GOAL #7   Title Pt will initiate forward trunk lean with mod A 75% of time in order to carryover to improved independence with transfers.    Time 4   Period Weeks   Status New           PT Long Term Goals - 02/11/17 2019      PT LONG TERM GOAL #1   Title Pt will perform rolling R at mod A level in order to indicate improved functional independence. (Target Date: 04/08/17)   Time 8   Period Weeks   Status New     PT LONG TERM GOAL #2   Title Pt will perform rolling to the L at S level in order to indicate improved functional independence.    Time 8   Period Weeks   Status New     PT LONG TERM GOAL #3   Title Pt will perform upper body/waist level RUE task while sitting with back unsupported at EOM (BLE supported) x 5 mins in order to indicate increased participation with ADLs.    Time 8   Period Weeks   Status New     PT LONG TERM GOAL #4   Title Pt will perform SB transfer  at mod A level in order to indicate improved functional  independence.     Time 8   Period Weeks   Status New     PT LONG TERM GOAL #5   Title Pt will perform SL<>sit at mod A level 50% of the time (with use of bed functions as needed) in order to indicate improved functional independence.    Time 8   Period Weeks   Status New     Additional Long Term Goals   Additional Long Term Goals Yes     PT LONG TERM GOAL #6   Title Pt will initiate forward trunk lean at S level 75% of the time in order to indicate improved carryover with transfers.     Time 8   Period Weeks   Status New               Plan - 03/01/17 2025    Clinical Impression Statement Arrived late for PT session (using [employer] public transportation). Focus of session on weight-shifting anterior and to the right to prepare for transfer training. Pt reports it is scarey to move to right and forward with fear of falling. With repetition and PT sitting in front of pt during activities, she gradually shifted farther and farther and required less time to accomplish task.   Rehab Potential Fair   Clinical Impairments Affecting Rehab Potential severity of deficits, co-morbidities   PT Frequency 2x / week   PT Duration 8 weeks   PT Treatment/Interventions ADLs/Self Care Home Management;Functional mobility training;Therapeutic activities;Therapeutic exercise;Balance training;Neuromuscular re-education;Cognitive remediation;Patient/family education;Wheelchair mobility training;Passive range of motion   PT Next Visit Plan Stretching program for BLEs (esp if caregivers present), ?hoyer onto mat table, work on precursors to SB or squat-pivot transfers and by end of session transfer to w/c as able (These are very time consuming and you will likely need +2A, heavy PUSHER), sitting balance, bed mobility, education on pressure relief (again, esp if caregiver present)   Consulted and Agree with Plan of Care Patient;Family member/caregiver    Family Member Consulted Husband      Patient will benefit from skilled therapeutic intervention in order to improve the following deficits and impairments:  Decreased activity tolerance, Decreased balance, Decreased cognition, Decreased coordination, Decreased endurance, Decreased mobility, Decreased range of motion, Decreased skin integrity, Decreased strength, Impaired perceived functional ability, Impaired flexibility, Impaired sensation, Impaired UE functional use, Improper body mechanics, Postural dysfunction, Pain (will not address knee pain directly)  Visit Diagnosis: 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)  Abnormal posture  Other symptoms and signs involving the nervous system  Muscle weakness (generalized)  Other abnormalities of gait and mobility     Problem List Patient Active Problem List   Diagnosis Date Noted  . Hypertensive emergency 08/04/2016  . Hypokalemia 08/04/2016  . Depression   . Cerebrovascular accident (CVA) due to thrombosis of right middle cerebral artery (HCC) s/p mechanical thrombectomy   . Hemiplegia affecting left nondominant side (HCC)   . HLD (hyperlipidemia)   . Dysphagia, post-stroke   . Dysarthria, post-stroke   . Expressive aphasia   . Acute blood loss anemia   . Benign essential HTN   . Atrial fibrillation with RVR (HCC) 07/29/2016  . Osteoarthritis of right hip 08/22/2015  . 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    Zena Amos , PT 03/01/2017, 8:31 PM  [facility] [address] [place], [place], [address] Phone: [phone]   Fax:  [phone]  Name: Mada Sadik  MRN: [id number] Date of Birth: 1940/11/10

## 2017-03-01 NOTE — Therapy (Signed)
[facility] [address] [place], [place], [address] Phone: [phone]   Fax:  [phone]  Speech Language Pathology Treatment  Patient Details  Name: Sahily Biddle MRN: [id number] Date of Birth: 21-Apr-1941 Referring Provider: Dr. [name]  Encounter Date: 03/01/2017      End of Session - 03/01/17 1633    Visit Number 4   Number of Visits 17   Date for SLP Re-Evaluation 04/01/17   SLP Start Time 1450   SLP Stop Time  1530   SLP Time Calculation (min) 40 min   Activity Tolerance Patient tolerated treatment well      Past Medical History:  Diagnosis Date  . Borderline hyperlipidemia   . Difficulty sleeping    DUE TO PAIN  . GERD (gastroesophageal reflux disease)   . Hypertension   . Neuromuscular disorder (HCC)   . Osteoarthritis   . PAF (paroxysmal atrial fibrillation) (HCC)   . Stroke [facility]) 07/2016    Past Surgical History:  Procedure Laterality Date  . IR GENERIC HISTORICAL  07/28/2016   IR PERCUTANEOUS ART THROMBECTOMY/INFUSION INTRACRANIAL INC DIAG ANGIO 07/28/2016 Julieanne Cotton, MD [facility]-INTERV RAD  . LYMPH NODE BIOPSY     BENIGN  . RADIOLOGY WITH ANESTHESIA N/A 07/28/2016   Procedure: RADIOLOGY WITH ANESTHESIA;  Surgeon: Julieanne Cotton, MD;  Location: [facility] OR;  Service: Radiology;  Laterality: N/A;  . TOTAL HIP ARTHROPLASTY Right 08/22/2015   Procedure: RIGHT TOTAL HIP ARTHROPLASTY ANTERIOR APPROACH;  Surgeon: Kathryne Hitch, MD;  Location: [facility] ORS;  Service: Orthopedics;  Laterality: Right;    There were no vitals filed for this visit.      Subjective Assessment - 03/01/17 1459    Subjective "Because - stroke - and - articulate."   Patient is accompained by: Family member   Special Tests spouse   Currently in Pain? Yes   Pain Score 9    Pain Location Knee   Pain Orientation Right   Pain Onset More than a month ago   Pain Frequency Constant   Aggravating Factors  rain   Pain  Relieving Factors better weather   Multiple Pain Sites No               ADULT SLP TREATMENT - 03/01/17 1502      General Information   Behavior/Cognition Alert;Cooperative;Pleasant mood   Patient Positioning Upright in chair   Oral care provided N/A     Treatment Provided   Treatment provided Cognitive-Linquistic     Cognitive-Linquistic Treatment   Treatment focused on Aphasia   Skilled Treatment Pt with significant left inattention complicating reading tasks during therapy. SLP provided environmental adjustments to reduce competing stimuli and visual cues to left side of page. Patient required consistent mod-max A including verbal and tactile cues to utilize structure provided. Targeted left attention with letter identification task. Pt with 60% total accuracy in identifying letters with verbal directions to whiteboard location, 100% of errors left-sided. Corrects errors with mod-max A and cuing for use of compenatory strategies (tracking with finger, use of visual aids).     Assessment / Recommendations / Plan   Plan Continue with current plan of care     Progression Toward Goals   Progression toward goals Progressing toward goals          SLP Education - 03/01/17 1633    Education provided Yes   Education Details strategies to compensate for left neglect   Person(s) Educated Patient;Spouse   Methods Explanation;Demonstration  Comprehension Verbalized understanding;Need further instruction          SLP Short Term Goals - 03/01/17 1637      SLP SHORT TERM GOAL #1   Title Pt will complete moderately complex naming tasks with 90% accuracy and occasional min A.   Time 2   Period Weeks   Status On-going     SLP SHORT TERM GOAL #2   Title Pt will generate grammatical  simple subject-verb-object sentences with 80% accuracy and occasional min A.   Time 2   Period Weeks   Status On-going     SLP SHORT TERM GOAL #3   Title Pt will attend to the left side of the  page when reading short 3-5 sentence paragraphs with occasional min A   Time 2   Period Weeks   Status On-going          SLP Long Term Goals - 03/01/17 1637      SLP LONG TERM GOAL #1   Time 6   Period Weeks   Status On-going     SLP LONG TERM GOAL #2   Title Pt will generate 2-3 sentence utterances in structured tasks with 85% accuracy and occasional min A.   Time 6   Period Weeks   Status On-going          Plan - 03/01/17 1634    Clinical Impression Statement SLP focused session on left inattention as pt was unable to complete her assigned homework, suspect largely due to impact of inattention on reading. Pt benefits from reducing competing stimuli, continues to require mod-max A including verbal, tacile and visual cues to attend to left-sided details. Continue skilled ST to maximize verbal expression for QOL and to reduce caregiver burden.    Speech Therapy Frequency 2x / week   Treatment/Interventions Functional tasks;Patient/family education;Compensatory strategies;Cueing hierarchy;Multimodal communcation approach;Cognitive reorganization;Internal/external aids;SLP instruction and feedback;Language facilitation   Potential to Achieve Goals Fair   Potential Considerations Severity of impairments   Consulted and Agree with Plan of Care Patient;Family member/caregiver   Family Member Consulted spouse      Patient will benefit from skilled therapeutic intervention in order to improve the following deficits and impairments:   Aphasia    Problem List Patient Active Problem List   Diagnosis Date Noted  . Hypertensive emergency 08/04/2016  . Hypokalemia 08/04/2016  . Depression   . Cerebrovascular accident (CVA) due to thrombosis of right middle cerebral artery (HCC) s/p mechanical thrombectomy   . Hemiplegia affecting left nondominant side (HCC)   . HLD (hyperlipidemia)   . Dysphagia, post-stroke   . Dysarthria, post-stroke   . Expressive aphasia   . Acute blood  loss anemia   . Benign essential HTN   . Atrial fibrillation with RVR (HCC) 07/29/2016  . Osteoarthritis of right hip 08/22/2015  . 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   Rondel Baton, MS CF-SLP Speech-Language Pathologist   WILDA WETHERELL 03/01/2017, 4:39 PM  [facility] [address] [place], [place], [address] Phone: [phone]   Fax:  [phone]   Name: Aolani Piggott MRN: [id number] Date of Birth: [birth year]

## 2017-03-01 NOTE — Therapy (Signed)
[facility] [address] [place], [place], [address] Phone: [phone]   Fax:  [phone]  Occupational Therapy Treatment  Patient Details  Name: Diana Bush MRN: [id number] Date of Birth: 08-31-1941 Referring Provider: [name]  Encounter Date: 03/01/2017      OT End of Session - 03/01/17 1645    Visit Number 3   Number of Visits 17   Date for OT Re-Evaluation 04/12/17   Authorization Type Medicare- Progress note and G code every 10th visit   Authorization - Visit Number 3   Authorization - Number of Visits 10   OT Start Time 1530   OT Stop Time 1615   OT Time Calculation (min) 45 min   Activity Tolerance Patient tolerated treatment well   Behavior During Therapy [facility] for tasks assessed/performed      Past Medical History:  Diagnosis Date  . Borderline hyperlipidemia   . Difficulty sleeping    DUE TO PAIN  . GERD (gastroesophageal reflux disease)   . Hypertension   . Neuromuscular disorder (HCC)   . Osteoarthritis   . PAF (paroxysmal atrial fibrillation) (HCC)   . Stroke [facility]) 07/2016    Past Surgical History:  Procedure Laterality Date  . IR GENERIC HISTORICAL  07/28/2016   IR PERCUTANEOUS ART THROMBECTOMY/INFUSION INTRACRANIAL INC DIAG ANGIO 07/28/2016 Julieanne Cotton, MD [facility]-INTERV RAD  . LYMPH NODE BIOPSY     BENIGN  . RADIOLOGY WITH ANESTHESIA N/A 07/28/2016   Procedure: RADIOLOGY WITH ANESTHESIA;  Surgeon: Julieanne Cotton, MD;  Location: [facility] OR;  Service: Radiology;  Laterality: N/A;  . TOTAL HIP ARTHROPLASTY Right 08/22/2015   Procedure: RIGHT TOTAL HIP ARTHROPLASTY ANTERIOR APPROACH;  Surgeon: Kathryne Hitch, MD;  Location: [facility] ORS;  Service: Orthopedics;  Laterality: Right;    There were no vitals filed for this visit.      Subjective Assessment - 03/01/17 1640    Subjective  Need to lean forward   Patient is accompained by: Family member   Currently in Pain? Yes   Pain  Score 9    Pain Location Knee   Pain Orientation Right   Pain Descriptors / Indicators Aching   Pain Type Chronic pain   Pain Onset More than a month ago   Pain Frequency Constant   Aggravating Factors  weather- rain   Pain Relieving Factors pain patch   Effect of Pain on Daily Activities Limits mobility                      OT Treatments/Exercises (OP) - 03/01/17 0001      Neurological Re-education Exercises   Other Exercises 1 Neuromuscular reeducation to address postural control, and weight shift toward right and forward.  Patient pushes strongly to weaker left side.  Patient able to unweight bottom, accept slight weight onto legs.  Worked on active squat pivot transfer toward right side to get back into wheelchair- with total assist of 2 people.  Patient very pleased that she was able to transfer without hydraulic lift.                  OT Education - 03/01/17 1644    Education provided Yes   Education Details importance of weight shift to right, and forward    Person(s) Educated Patient   Methods Explanation;Demonstration   Comprehension Need further instruction;Verbal cues required;Tactile cues required          OT Short Term Goals - 02/16/17 1058  OT SHORT TERM GOAL #1   Title Patient/caregiver will demonstrate competence with passive range of motion program for left arm due 03/12/2017   Time 4   Period Weeks   Status On-going     OT SHORT TERM GOAL #2   Title Patient will shift trunk forward in wheelchair with minimal assitance in preparation for a level surface transfer    Time 4   Period Weeks   Status On-going     OT SHORT TERM GOAL #3   Title Patient will reach below knees to mid calf with min assist in preparation for assist with lower body dressing   Time 4   Period Weeks   Status On-going     OT SHORT TERM GOAL #4   Title With mod assist, patient will scoot one hip forward on level surface in preparation for bedside commode  transfer   Time 4   Period Weeks   Status On-going     OT SHORT TERM GOAL #5   Title Patient will tolerate resting hand splint and wear 1-2 hours at a time during the day   Time 4   Period Weeks   Status On-going           OT Long Term Goals - 02/16/17 1059      OT LONG TERM GOAL #1   Title Patient will lean trunk forward from wheelchair back without assistance in preparation for a transfer to drop arm commode due 04/12/2017   Time 8   Period Weeks   Status On-going     OT LONG TERM GOAL #2   Title Patient will scoot both hips forward on level surface in preparation for transfer to drop arm commode   Time 8   Period Weeks   Status On-going     OT LONG TERM GOAL #3   Title Patient will demonstrate passive range of motion in left shoulder to at least 110 degrees of flexion or abduction with pain no greater than 2/10 to aide in upper body dressing   Time 8   Period Weeks   Status On-going     OT LONG TERM GOAL #4   Title Patient will demonstrate sufficient hip flexion to weight shift forward while seated to reach to feet as needed for lower body bathing and dressing   Time 8   Period Weeks   Status On-going     OT LONG TERM GOAL #5   Title Patient will stand with max assist to aide caregivers with toilet hygiene and clothing management    Time 8   Period Weeks   Status On-going               Plan - 03/01/17 1645    Clinical Impression Statement Patient with severe perceptual deficits, and pain from knee arthritis.  Patient responding well to repetition of simple forward/ right active weight shifts   Rehab Potential Fair   Clinical Impairments Affecting Rehab Potential severity of deficits   OT Frequency 2x / week   OT Duration 8 weeks   OT Treatment/Interventions Self-care/ADL training;Therapeutic exercise;Functional Mobility Training;Patient/family education;Balance training;Splinting;Manual Therapy;Neuromuscular education;Ultrasound;Therapeutic  activities;DME and/or AE instruction;Electrical Stimulation;Cognitive remediation/compensation;Visual/perceptual remediation/compensation;Passive range of motion;Moist Heat   Plan Fabricate resting hand splint left   Consulted and Agree with Plan of Care Patient;Family member/caregiver   Family Member Consulted Husband Thayer Ohm      Patient will benefit from skilled therapeutic intervention in order to improve the following deficits and impairments:  Decreased activity  tolerance, Decreased balance, Decreased cognition, Decreased coordination, Decreased safety awareness, Decreased range of motion, Decreased mobility, Decreased knowledge of use of DME, Decreased knowledge of precautions, Decreased endurance, Decreased skin integrity, Decreased strength, Impaired tone, Impaired UE functional use, Other (comment), Pain, Obesity, Impaired sensation, Improper spinal/pelvic alignment, Improper body mechanics, Impaired perceived functional ability, Increased muscle spasms, Impaired vision/preception  Visit Diagnosis: 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)  Chronic left shoulder pain  Abnormal posture  Other disturbances of skin sensation  Other symptoms and signs involving the nervous system  Other symptoms and signs involving cognitive functions following cerebral infarction  Stiffness of left shoulder, not elsewhere classified  Stiffness of left wrist, not elsewhere classified  Stiffness of left hand, not elsewhere classified    Problem List Patient Active Problem List   Diagnosis Date Noted  . Hypertensive emergency 08/04/2016  . Hypokalemia 08/04/2016  . Depression   . Cerebrovascular accident (CVA) due to thrombosis of right middle cerebral artery (HCC) s/p mechanical thrombectomy   . Hemiplegia affecting left nondominant side (HCC)   . HLD (hyperlipidemia)   . Dysphagia, post-stroke   . Dysarthria, post-stroke   . Expressive  aphasia   . Acute blood loss anemia   . Benign essential HTN   . Atrial fibrillation with RVR (HCC) 07/29/2016  . Osteoarthritis of right hip 08/22/2015  . 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    Collier Salina , OTR/L 03/01/2017, 4:48 PM  [facility] [address] [place], [place], [address] Phone: [phone]   Fax:  [phone]  Name: Diana Bush MRN: [id number] Date of Birth: 1941-07-19

## 2017-03-04 ENCOUNTER — Ambulatory Visit: Payer: Medicare Other

## 2017-03-04 ENCOUNTER — Ambulatory Visit: Payer: Medicare Other | Admitting: Rehabilitation

## 2017-03-04 ENCOUNTER — Encounter: Payer: Self-pay | Admitting: Rehabilitation

## 2017-03-04 ENCOUNTER — Ambulatory Visit: Payer: Medicare Other | Admitting: Occupational Therapy

## 2017-03-04 DIAGNOSIS — M6281 Muscle weakness (generalized): Secondary | ICD-10-CM

## 2017-03-04 DIAGNOSIS — R293 Abnormal posture: Secondary | ICD-10-CM

## 2017-03-04 DIAGNOSIS — R208 Other disturbances of skin sensation: Secondary | ICD-10-CM

## 2017-03-04 DIAGNOSIS — I69352 Hemiplegia and hemiparesis following cerebral infarction affecting left dominant side: Secondary | ICD-10-CM

## 2017-03-04 DIAGNOSIS — R4701 Aphasia: Secondary | ICD-10-CM

## 2017-03-04 DIAGNOSIS — G8929 Other chronic pain: Secondary | ICD-10-CM

## 2017-03-04 DIAGNOSIS — M25512 Pain in left shoulder: Secondary | ICD-10-CM

## 2017-03-04 DIAGNOSIS — R2689 Other abnormalities of gait and mobility: Secondary | ICD-10-CM

## 2017-03-04 DIAGNOSIS — I69354 Hemiplegia and hemiparesis following cerebral infarction affecting left non-dominant side: Secondary | ICD-10-CM | POA: Diagnosis not present

## 2017-03-04 DIAGNOSIS — R29818 Other symptoms and signs involving the nervous system: Secondary | ICD-10-CM

## 2017-03-04 NOTE — Therapy (Signed)
[facility] [address] [place], [place], [address] Phone: [phone]   Fax:  [phone]  Speech Language Pathology Treatment  Patient Details  Name: Diana Bush MRN: [id number] Date of Birth: 1941/04/01 Referring Provider: Dr. [name]  Encounter Date: 03/04/2017      End of Session - 03/04/17 1320    Visit Number 5   Number of Visits 17   Date for SLP Re-Evaluation 04/01/17   SLP Start Time 1150   SLP Stop Time  1231   SLP Time Calculation (min) 41 min   Activity Tolerance Patient tolerated treatment well      Past Medical History:  Diagnosis Date  . Borderline hyperlipidemia   . Difficulty sleeping    DUE TO PAIN  . GERD (gastroesophageal reflux disease)   . Hypertension   . Neuromuscular disorder (HCC)   . Osteoarthritis   . PAF (paroxysmal atrial fibrillation) (HCC)   . Stroke [facility]) 07/2016    Past Surgical History:  Procedure Laterality Date  . IR GENERIC HISTORICAL  07/28/2016   IR PERCUTANEOUS ART THROMBECTOMY/INFUSION INTRACRANIAL INC DIAG ANGIO 07/28/2016 Julieanne Cotton, MD [facility]-INTERV RAD  . LYMPH NODE BIOPSY     BENIGN  . RADIOLOGY WITH ANESTHESIA N/A 07/28/2016   Procedure: RADIOLOGY WITH ANESTHESIA;  Surgeon: Julieanne Cotton, MD;  Location: [facility] OR;  Service: Radiology;  Laterality: N/A;  . TOTAL HIP ARTHROPLASTY Right 08/22/2015   Procedure: RIGHT TOTAL HIP ARTHROPLASTY ANTERIOR APPROACH;  Surgeon: Kathryne Hitch, MD;  Location: [facility]S;  Service: Orthopedics;  Laterality: Right;    There were no vitals filed for this visit.      Subjective Assessment - 03/04/17 1154    Subjective "work - me" (pt did not complete homework)               ADULT SLP TREATMENT - 03/04/17 1157      General Information   Behavior/Cognition Alert;Cooperative;Pleasant mood     Treatment Provided   Treatment provided Cognitive-Linquistic     Pain Assessment   Pain Assessment 0-10    Pain Score 8    Pain Location rt knee   Pain Descriptors / Indicators Sore   Pain Intervention(s) Monitored during session     Cognitive-Linquistic Treatment   Treatment focused on Aphasia   Skilled Treatment Pt with cont'd significant left inattention complicating reading tasks during therapy. SLP again provided environmental adjustments to reduce competing stimuli (closing blinds) and held cards to pt's left side. Patient required consistent mod-max A including verbal and tactile cues to utilize structure provided. Pt success with naming one thing about a picture 80% success and occasional min A.  Short simple conversation resulted in SLP asking numerous ?s re: pt's broken utterances.      Assessment / Recommendations / Plan   Plan Continue with current plan of care     Progression Toward Goals   Progression toward goals Progressing toward goals            SLP Short Term Goals - 03/04/17 1321      SLP SHORT TERM GOAL #1   Title Pt will complete moderately complex naming tasks with 90% accuracy and occasional min A.   Time 2   Period Weeks   Status On-going     SLP SHORT TERM GOAL #2   Title Pt will generate grammatical  simple subject-verb-object sentences with 80% accuracy and occasional min A.   Time 2   Period Weeks  Status On-going     SLP SHORT TERM GOAL #3   Title Pt will attend to the left side of the page when reading short 3-5 sentence paragraphs with occasional min A   Time 2   Period Weeks   Status On-going          SLP Long Term Goals - 03/04/17 1321      SLP LONG TERM GOAL #1   Title Pt will participate functionally in 5 minute conversation and occasional min A   Time 6   Period Weeks   Status Revised     SLP LONG TERM GOAL #2   Title Pt will generate 2 sentence utterances in structured tasks with 85% accuracy and occasional min A.   Time 6   Period Weeks   Status Revised          Plan - 03/04/17 1320    Clinical Impression Statement  SLP focused session on left inattention as well as expressive language. Pt benefits from reducing competing stimuli, continues to require mod-max A including verbal, tacile and visual cues to attend to left-sided details. SLP had to ask numerous questions today in simple conversation to comprehend pt's message. Continue skilled ST to maximize verbal expression for QOL and to reduce caregiver burden.    Speech Therapy Frequency 2x / week   Treatment/Interventions Functional tasks;Patient/family education;Compensatory strategies;Cueing hierarchy;Multimodal communcation approach;Cognitive reorganization;Internal/external aids;SLP instruction and feedback;Language facilitation   Potential to Achieve Goals Fair   Potential Considerations Severity of impairments   Consulted and Agree with Plan of Care Patient;Family member/caregiver   Family Member Consulted spouse      Patient will benefit from skilled therapeutic intervention in order to improve the following deficits and impairments:   Aphasia    Problem List Patient Active Problem List   Diagnosis Date Noted  . Hypertensive emergency 08/04/2016  . Hypokalemia 08/04/2016  . Depression   . Cerebrovascular accident (CVA) due to thrombosis of right middle cerebral artery (HCC) s/p mechanical thrombectomy   . Hemiplegia affecting left nondominant side (HCC)   . HLD (hyperlipidemia)   . Dysphagia, post-stroke   . Dysarthria, post-stroke   . Expressive aphasia   . Acute blood loss anemia   . Benign essential HTN   . Atrial fibrillation with RVR (HCC) 07/29/2016  . Osteoarthritis of right hip 08/22/2015  . 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    [facility] ,MS, CCC-SLP  03/04/2017, 1:23 PM  [facility] [address] [place], [place], [address] Phone: [phone]   Fax:  [phone]   Name: Diana Bush MRN: [id number] Date of Birth: [birth year]

## 2017-03-04 NOTE — Therapy (Signed)
[facility] [address] [place], [place], [address] Phone: [phone]   Fax:  [phone]  Physical Therapy Treatment  Patient Details  Name: Diana Bush MRN: [id number] Date of Birth: 03/14/1941 Referring Provider: [name], MD  Encounter Date: 03/04/2017      PT End of Session - 03/04/17 1432    Visit Number 3   Number of Visits 17   Date for PT Re-Evaluation 04/12/17   Authorization Type MCR-G code on every 10th visit   PT Start Time 1017   PT Stop Time 1102   PT Time Calculation (min) 45 min   Activity Tolerance Patient limited by pain;Other (comment)  limited by incontinence   Behavior During Therapy [facility] for tasks assessed/performed      Past Medical History:  Diagnosis Date  . Borderline hyperlipidemia   . Difficulty sleeping    DUE TO PAIN  . GERD (gastroesophageal reflux disease)   . Hypertension   . Neuromuscular disorder (HCC)   . Osteoarthritis   . PAF (paroxysmal atrial fibrillation) (HCC)   . Stroke [facility]) 07/2016    Past Surgical History:  Procedure Laterality Date  . IR GENERIC HISTORICAL  07/28/2016   IR PERCUTANEOUS ART THROMBECTOMY/INFUSION INTRACRANIAL INC DIAG ANGIO 07/28/2016 Julieanne Cotton, MD [facility]-INTERV RAD  . LYMPH NODE BIOPSY     BENIGN  . RADIOLOGY WITH ANESTHESIA N/A 07/28/2016   Procedure: RADIOLOGY WITH ANESTHESIA;  Surgeon: Julieanne Cotton, MD;  Location: [facility] OR;  Service: Radiology;  Laterality: N/A;  . TOTAL HIP ARTHROPLASTY Right 08/22/2015   Procedure: RIGHT TOTAL HIP ARTHROPLASTY ANTERIOR APPROACH;  Surgeon: Kathryne Hitch, MD;  Location: [facility] ORS;  Service: Orthopedics;  Laterality: Right;    There were no vitals filed for this visit.      Subjective Assessment - 03/04/17 1021    Subjective Pt still having increased knee pain.    Patient is accompained by: Family member   Limitations House hold activities;Walking;Standing;Sitting   Patient Stated  Goals "I want to walk and per husband strengthen L side."    Currently in Pain? Yes   Pain Score 9    Pain Location Knee   Pain Orientation Right   Pain Descriptors / Indicators Aching   Pain Type Chronic pain   Pain Onset More than a month ago   Pain Frequency Constant   Aggravating Factors  weather   Pain Relieving Factors pain patch, exercise                         [facility] Adult PT Treatment/Exercise - 03/04/17 0001      Bed Mobility   Bed Mobility Rolling Left;Left Sidelying to Sit;Sit to Sidelying Left   Rolling Left 3: Mod assist   Rolling Left Details (indicate cue type and reason) Pt able to initiate R knee flex and reaching across body, however needs max cues to continue to roll and reach for EOM.  Provided hand over hand assist for this and mod A to get hips all the way into SL.     Left Sidelying to Sit 2: Max assist   Left Sidelying to Sit Details (indicate cue type and reason) Pt able to initiate getting LEs off of mat, however requires max A to elevate trunk into sitting with facilitation for flexion and rotation.  Note decreased pushing with cues to reach L as she is coming into sitting.      Transfers   Transfers Lateral/Scoot  Transfers   Lateral/Scoot Transfers With slide board;With armrests removed;1: +2 Total assist   Lateral Transfers: Patient Percentage 50%   Lateral/Scoot Transfer Details (indicate cue type and reason) Had second PT today for safety, however this PTA mainly assisted with stabilizing chair due to faulty brakes.  Pt doing much better initiating forward trunk lean and weight shift.  Assist for scooting L hip forward to prepare for transfer.  Allowed increased time for pt to feel weight bearing through LEs to lift buttocks onto SB.  Note she did better going to the L due to pusher tendencies, but had increased difficulty maintaining forward trunk lean when going L vs R.       Therapeutic Activites    Therapeutic Activities Other  Therapeutic Activities   Other Therapeutic Activities Performed PROM/stretching to BLEs while in supine; knee to chest x 10 reps with 2 reps of 1 min hold, hip ER stretch x 2 min hold, and hamstring stretch x 2 min on each side to assist with knee pain and flexibiity.  Pt tolerated well.      Neuro Re-ed    Neuro Re-ed Details  While seated on mat, had pt work on forward weight shift with flexion at hips and upright head and chest.  Provided light facilitation and pt able to achieve but difficult to maintain.  Also had her place hand on therapists leg to facilitate and encourage forward and L lateral weight shift, leaning onto L elbow, attempting to move body away from arm (this was difficult likely from a cognitive standpoint).  Also had pt push therapist away with LUE to facilitate forward weight shift.  Attempted to have pt load LEs and elevate buttocks.  Pt did well with forward weight shift, but unable to bear weight through LEs enough to elevate without pushing L.                 PT Education - 03/04/17 1432    Education provided Yes   Education Details working on forward weight shift   Person(s) Educated Patient   Methods Explanation;Demonstration;Tactile cues   Comprehension Verbalized understanding;Returned demonstration;Verbal cues required;Tactile cues required;Need further instruction          PT Short Term Goals - 02/11/17 2011      PT SHORT TERM GOAL #1   Title Pt/caregivers will demonstrate understanding of BLE stretching and strengthening HEP in order to indicate improved functional mobility.  (Target Date: 03/11/17)   Time 4   Period Weeks   Status New     PT SHORT TERM GOAL #2   Title Pt will roll to the R at max A level with mod cues for attending to R side when rolling in order to improve bed mobility.    Time 4   Period Weeks   Status New     PT SHORT TERM GOAL #3   Title Pt will roll to the L with min A in order to indicate improved bed mobility.      Time 4   Period Weeks   Status New     PT SHORT TERM GOAL #4   Title Pt will transition from SL<>sit with max A in order to indicate improved functional mobility.     Time 4   Period Weeks   Status New     PT SHORT TERM GOAL #5   Title Pt will perform static sitting balance with BUE/LE support at mod A level in order to improve funcitonal mobility and  ADLs.    Time 4   Period Weeks   Status New     Additional Short Term Goals   Additional Short Term Goals Yes     PT SHORT TERM GOAL #6   Title Pt will perform SB transfer to/from w/c at max A level in order to improve functional mobility with ADLs.    Time 4   Period Weeks   Status New     PT SHORT TERM GOAL #7   Title Pt will initiate forward trunk lean with mod A 75% of time in order to carryover to improved independence with transfers.    Time 4   Period Weeks   Status New           PT Long Term Goals - 02/11/17 2019      PT LONG TERM GOAL #1   Title Pt will perform rolling R at mod A level in order to indicate improved functional independence. (Target Date: 04/08/17)   Time 8   Period Weeks   Status New     PT LONG TERM GOAL #2   Title Pt will perform rolling to the L at S level in order to indicate improved functional independence.    Time 8   Period Weeks   Status New     PT LONG TERM GOAL #3   Title Pt will perform upper body/waist level RUE task while sitting with back unsupported at EOM (BLE supported) x 5 mins in order to indicate increased participation with ADLs.    Time 8   Period Weeks   Status New     PT LONG TERM GOAL #4   Title Pt will perform SB transfer at mod A level in order to indicate improved functional independence.     Time 8   Period Weeks   Status New     PT LONG TERM GOAL #5   Title Pt will perform SL<>sit at mod A level 50% of the time (with use of bed functions as needed) in order to indicate improved functional independence.    Time 8   Period Weeks   Status New      Additional Long Term Goals   Additional Long Term Goals Yes     PT LONG TERM GOAL #6   Title Pt will initiate forward trunk lean at S level 75% of the time in order to indicate improved carryover with transfers.     Time 8   Period Weeks   Status New               Plan - 03/04/17 1433    Clinical Impression Statement Skilled session focused on improved postural control and forward weight shift for improved transfers, sitting balance, dynamic sitting balance, decreasing perceptual deficits and supine therex for LE flexibility.    Rehab Potential Fair   Clinical Impairments Affecting Rehab Potential severity of deficits, co-morbidities   PT Frequency 2x / week   PT Duration 8 weeks   PT Treatment/Interventions ADLs/Self Care Home Management;Functional mobility training;Therapeutic activities;Therapeutic exercise;Balance training;Neuromuscular re-education;Cognitive remediation;Patient/family education;Wheelchair mobility training;Passive range of motion   PT Next Visit Plan Stretching program for BLEs (esp if caregivers present), ?hoyer onto mat table, work on precursors to SB or squat-pivot transfers and by end of session transfer to w/c as able (These are very time consuming and you will likely need +2A, heavy PUSHER), sitting balance, bed mobility, education on pressure relief (again, esp if caregiver present)   Consulted and  Agree with Plan of Care Patient;Family member/caregiver   Family Member Consulted Husband      Patient will benefit from skilled therapeutic intervention in order to improve the following deficits and impairments:  Decreased activity tolerance, Decreased balance, Decreased cognition, Decreased coordination, Decreased endurance, Decreased mobility, Decreased range of motion, Decreased skin integrity, Decreased strength, Impaired perceived functional ability, Impaired flexibility, Impaired sensation, Impaired UE functional use, Improper body mechanics, Postural  dysfunction, Pain (will not address knee pain directly)  Visit Diagnosis: 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)  Abnormal posture  Muscle weakness (generalized)  Other abnormalities of gait and mobility     Problem List Patient Active Problem List   Diagnosis Date Noted  . Hypertensive emergency 08/04/2016  . Hypokalemia 08/04/2016  . Depression   . Cerebrovascular accident (CVA) due to thrombosis of right middle cerebral artery (HCC) s/p mechanical thrombectomy   . Hemiplegia affecting left nondominant side (HCC)   . HLD (hyperlipidemia)   . Dysphagia, post-stroke   . Dysarthria, post-stroke   . Expressive aphasia   . Acute blood loss anemia   . Benign essential HTN   . Atrial fibrillation with RVR (HCC) 07/29/2016  . Osteoarthritis of right hip 08/22/2015  . 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    Harriet Butte, PT, MPT [facility] [address] [place], [place], [address] Phone: [phone]   Fax:  [phone] 03/04/17, 2:35 PM  Name: Diana Bush MRN: [id number] Date of Birth: 09/29/1941

## 2017-03-04 NOTE — Patient Instructions (Signed)
  Please complete the assigned speech therapy homework prior to your next session and return it to the speech therapist at your next visit.  

## 2017-03-04 NOTE — Therapy (Signed)
[facility] [address] [place], [place], [address] Phone: [phone]   Fax:  [phone]  Occupational Therapy Treatment  Patient Details  Name: Diana Bush MRN: [id number] Date of Birth: [birth year] Referring Provider: [name]  Encounter Date: 03/04/2017      OT End of Session - 03/04/17 1153    Visit Number 4   Number of Visits 17   Date for OT Re-Evaluation 04/12/17   Authorization Type Medicare- Progress note and G code every 10th visit   Authorization - Visit Number 4   Authorization - Number of Visits 10   OT Start Time 1102   OT Stop Time 1145   OT Time Calculation (min) 43 min   Activity Tolerance Patient tolerated treatment well   Behavior During Therapy [facility] for tasks assessed/performed      Past Medical History:  Diagnosis Date  . Borderline hyperlipidemia   . Difficulty sleeping    DUE TO PAIN  . GERD (gastroesophageal reflux disease)   . Hypertension   . Neuromuscular disorder (HCC)   . Osteoarthritis   . PAF (paroxysmal atrial fibrillation) (HCC)   . Stroke [facility]) 07/2016    Past Surgical History:  Procedure Laterality Date  . IR GENERIC HISTORICAL  07/28/2016   IR PERCUTANEOUS ART THROMBECTOMY/INFUSION INTRACRANIAL INC DIAG ANGIO 07/28/2016 Julieanne Cotton, MD [facility]-INTERV RAD  . LYMPH NODE BIOPSY     BENIGN  . RADIOLOGY WITH ANESTHESIA N/A 07/28/2016   Procedure: RADIOLOGY WITH ANESTHESIA;  Surgeon: Julieanne Cotton, MD;  Location: [facility] OR;  Service: Radiology;  Laterality: N/A;  . TOTAL HIP ARTHROPLASTY Right 08/22/2015   Procedure: RIGHT TOTAL HIP ARTHROPLASTY ANTERIOR APPROACH;  Surgeon: Kathryne Hitch, MD;  Location: [facility] ORS;  Service: Orthopedics;  Laterality: Right;    There were no vitals filed for this visit.      Subjective Assessment - 03/04/17 1130    Currently in Pain? Yes   Pain Location Knee   Pain Orientation Right   Pain Descriptors / Indicators Aching   Pain Type Chronic pain   Pain Onset More than a month ago   Pain Frequency Constant   Aggravating Factors  wether   Pain Relieving Factors pain patch          Treatment:Ttherapist assisted PT with sliding board transfer mat to w/c, pt demonstrates improved ability to assist following max v.c. To lean forwards. Therapist cleaned and dried  left hand as pt had dead skin, in between digits and at thumb web space. Therapist initiated fabrication of a custom resting hand splint yet did not finish due to time constraints.                      OT Short Term Goals - 02/16/17 1058      OT SHORT TERM GOAL #1   Title Patient/caregiver will demonstrate competence with passive range of motion program for left arm due 03/12/2017   Time 4   Period Weeks   Status On-going     OT SHORT TERM GOAL #2   Title Patient will shift trunk forward in wheelchair with minimal assitance in preparation for a level surface transfer    Time 4   Period Weeks   Status On-going     OT SHORT TERM GOAL #3   Title Patient will reach below knees to mid calf with min assist in preparation for assist with lower body dressing   Time 4   Period Weeks  Status On-going     OT SHORT TERM GOAL #4   Title With mod assist, patient will scoot one hip forward on level surface in preparation for bedside commode transfer   Time 4   Period Weeks   Status On-going     OT SHORT TERM GOAL #5   Title Patient will tolerate resting hand splint and wear 1-2 hours at a time during the day   Time 4   Period Weeks   Status On-going           OT Long Term Goals - 02/16/17 1059      OT LONG TERM GOAL #1   Title Patient will lean trunk forward from wheelchair back without assistance in preparation for a transfer to drop arm commode due 04/12/2017   Time 8   Period Weeks   Status On-going     OT LONG TERM GOAL #2   Title Patient will scoot both hips forward on level surface in preparation for transfer to  drop arm commode   Time 8   Period Weeks   Status On-going     OT LONG TERM GOAL #3   Title Patient will demonstrate passive range of motion in left shoulder to at least 110 degrees of flexion or abduction with pain no greater than 2/10 to aide in upper body dressing   Time 8   Period Weeks   Status On-going     OT LONG TERM GOAL #4   Title Patient will demonstrate sufficient hip flexion to weight shift forward while seated to reach to feet as needed for lower body bathing and dressing   Time 8   Period Weeks   Status On-going     OT LONG TERM GOAL #5   Title Patient will stand with max assist to aide caregivers with toilet hygiene and clothing management    Time 8   Period Weeks   Status On-going               Plan - 03/04/17 1154    Clinical Impression Statement Pt demonstrates spasticity in left hand. Pt can benefit from splinting for improved positioning.   Rehab Potential Fair   Clinical Impairments Affecting Rehab Potential severity of deficits   OT Frequency 2x / week   OT Duration 8 weeks   OT Treatment/Interventions Self-care/ADL training;Therapeutic exercise;Functional Mobility Training;Patient/family education;Balance training;Splinting;Manual Therapy;Neuromuscular education;Ultrasound;Therapeutic activities;DME and/or AE instruction;Electrical Stimulation;Cognitive remediation/compensation;Visual/perceptual remediation/compensation;Passive range of motion;Moist Heat   Plan splint initiated, due to time constraints did not finish, complete splint and issue.   Consulted and Agree with Plan of Care Patient;Family member/caregiver   Family Member Consulted Husband Thayer Ohm      Patient will benefit from skilled therapeutic intervention in order to improve the following deficits and impairments:  Decreased activity tolerance, Decreased balance, Decreased cognition, Decreased coordination, Decreased safety awareness, Decreased range of motion, Decreased mobility,  Decreased knowledge of use of DME, Decreased knowledge of precautions, Decreased endurance, Decreased skin integrity, Decreased strength, Impaired tone, Impaired UE functional use, Other (comment), Pain, Obesity, Impaired sensation, Improper spinal/pelvic alignment, Improper body mechanics, Impaired perceived functional ability, Increased muscle spasms, Impaired vision/preception  Visit Diagnosis: 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 Hypertensive emergency 08/04/2016  . Hypokalemia  08/04/2016  . Depression   . Cerebrovascular accident (CVA) due to thrombosis of right middle cerebral artery (HCC) s/p mechanical thrombectomy   . Hemiplegia affecting left nondominant side (HCC)   . HLD (hyperlipidemia)   . Dysphagia, post-stroke   . Dysarthria, post-stroke   . Expressive aphasia   . Acute blood loss anemia   . Benign essential HTN   . Atrial fibrillation with RVR (HCC) 07/29/2016  . Osteoarthritis of right hip 08/22/2015  . 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    Niyam Bisping 03/04/2017, 11:55 AM  [facility] [address] [place], [place], [address] Phone: [phone]   Fax:  [phone]  Name: Diana Bush MRN: [id number] Date of Birth: 1941/02/16

## 2017-03-08 ENCOUNTER — Ambulatory Visit: Payer: Medicare Other | Admitting: Occupational Therapy

## 2017-03-08 ENCOUNTER — Encounter: Payer: Self-pay | Admitting: Physical Therapy

## 2017-03-08 ENCOUNTER — Ambulatory Visit: Payer: Medicare Other | Admitting: Physical Therapy

## 2017-03-08 ENCOUNTER — Ambulatory Visit: Payer: Medicare Other | Attending: Internal Medicine

## 2017-03-08 DIAGNOSIS — R2689 Other abnormalities of gait and mobility: Secondary | ICD-10-CM | POA: Insufficient documentation

## 2017-03-08 DIAGNOSIS — R4701 Aphasia: Secondary | ICD-10-CM | POA: Insufficient documentation

## 2017-03-08 DIAGNOSIS — I69352 Hemiplegia and hemiparesis following cerebral infarction affecting left dominant side: Secondary | ICD-10-CM | POA: Insufficient documentation

## 2017-03-08 DIAGNOSIS — R293 Abnormal posture: Secondary | ICD-10-CM | POA: Insufficient documentation
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
## 2017-03-08 NOTE — Therapy (Signed)
[facility] [address] [place], [place], [address] Phone: [phone]   Fax:  [phone]  Speech Language Pathology Treatment  Patient Details  Name: Diana Bush MRN: [id number] Date of Birth: [birth year] Referring Provider: Dr. [name]  Encounter Date: 03/08/2017      End of Session - 03/08/17 1550    Visit Number 6   Number of Visits 17   Date for SLP Re-Evaluation 04/01/17   SLP Start Time 1448   SLP Stop Time  1528   SLP Time Calculation (min) 40 min      Past Medical History:  Diagnosis Date  . Borderline hyperlipidemia   . Difficulty sleeping    DUE TO PAIN  . GERD (gastroesophageal reflux disease)   . Hypertension   . Neuromuscular disorder (HCC)   . Osteoarthritis   . PAF (paroxysmal atrial fibrillation) (HCC)   . Stroke [facility]) 07/2016    Past Surgical History:  Procedure Laterality Date  . IR GENERIC HISTORICAL  07/28/2016   IR PERCUTANEOUS ART THROMBECTOMY/INFUSION INTRACRANIAL INC DIAG ANGIO 07/28/2016 Julieanne Cotton, MD [facility]-INTERV RAD  . LYMPH NODE BIOPSY     BENIGN  . RADIOLOGY WITH ANESTHESIA N/A 07/28/2016   Procedure: RADIOLOGY WITH ANESTHESIA;  Surgeon: Julieanne Cotton, MD;  Location: [facility] OR;  Service: Radiology;  Laterality: N/A;  . TOTAL HIP ARTHROPLASTY Right 08/22/2015   Procedure: RIGHT TOTAL HIP ARTHROPLASTY ANTERIOR APPROACH;  Surgeon: Kathryne Hitch, MD;  Location: [facility] ORS;  Service: Orthopedics;  Laterality: Right;    There were no vitals filed for this visit.      Subjective Assessment - 03/08/17 1448    Subjective "Stingin!" (re: horsefly in ST room)   Patient is accompained by: Family member  husband   Currently in Pain? Yes   Pain Score 9    Pain Location Knee   Pain Orientation Right   Pain Descriptors / Indicators Aching   Pain Type Chronic pain   Pain Onset More than a month ago   Pain Frequency Constant               ADULT SLP  TREATMENT - 03/08/17 1456      General Information   Behavior/Cognition Alert;Cooperative;Pleasant mood     Treatment Provided   Treatment provided Cognitive-Linquistic     Cognitive-Linquistic Treatment   Treatment focused on Aphasia   Skilled Treatment Left neglet/inattention indirectly targeted by SLP holding cards to pt's left. Pt named cards (foods) with 75% success on first or second attempt; 95% with multiple attempts (>2). Pt very good with error awareness. Simple conversation re: vegetable dishes (sauerkraut, slaw, eggplant parmesan, etc) completeed with SLP with telegraphic speech, and min A and multiple attempts.      Assessment / Recommendations / Plan   Plan Continue with current plan of care     Progression Toward Goals   Progression toward goals Progressing toward goals          SLP Education - 03/08/17 1540    Education provided Yes   Education Details conversational topics to have at home - high interest, high frequency topics   Person(s) Educated Patient;Spouse   Methods Explanation   Comprehension Verbalized understanding          SLP Short Term Goals - 03/08/17 1552      SLP SHORT TERM GOAL #1   Title Pt will complete moderately complex naming tasks with 90% accuracy and occasional min A.   Time 1  Period Weeks   Status On-going     SLP SHORT TERM GOAL #2   Title Pt will generate grammatical  simple subject-verb-object sentences with 80% accuracy and occasional min A.   Time 1   Period Weeks   Status On-going     SLP SHORT TERM GOAL #3   Title Pt will attend to the left side of the page when reading short 3-5 sentence paragraphs with occasional min A   Time 1   Period Weeks   Status On-going          SLP Long Term Goals - 03/08/17 1553      SLP LONG TERM GOAL #1   Title Pt will participate functionally in 5 minute conversation and occasional min A   Time 5   Period Weeks   Status On-going     SLP LONG TERM GOAL #2   Title Pt will  generate 2 sentence utterances in structured tasks with 85% accuracy and occasional min A.   Time 5   Period Weeks   Status On-going          Plan - 03/08/17 1551    Clinical Impression Statement SLP focused session on left inattention as well as expressive language. SLP found today that pt benefits from speaking on topics that are high interest and high frequency (e.g., cooking) - see "skilled interventino for details. Continue skilled ST to maximize verbal expression for QOL and to reduce caregiver burden.    Speech Therapy Frequency 2x / week   Duration --  8 weeks   Treatment/Interventions Functional tasks;Patient/family education;Compensatory strategies;Cueing hierarchy;Multimodal communcation approach;Cognitive reorganization;Internal/external aids;SLP instruction and feedback;Language facilitation   Potential to Achieve Goals Fair   Potential Considerations Severity of impairments      Patient will benefit from skilled therapeutic intervention in order to improve the following deficits and impairments:   Aphasia    Problem List Patient Active Problem List   Diagnosis Date Noted  . Hypertensive emergency 08/04/2016  . Hypokalemia 08/04/2016  . Depression   . Cerebrovascular accident (CVA) due to thrombosis of right middle cerebral artery (HCC) s/p mechanical thrombectomy   . Hemiplegia affecting left nondominant side (HCC)   . HLD (hyperlipidemia)   . Dysphagia, post-stroke   . Dysarthria, post-stroke   . Expressive aphasia   . Acute blood loss anemia   . Benign essential HTN   . Atrial fibrillation with RVR (HCC) 07/29/2016  . Osteoarthritis of right hip 08/22/2015  . 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    [facility] ,MS, CCC-SLP  03/08/2017, 3:54 PM  [facility] [address] [place], [place], [address] Phone: [phone]   Fax:  [phone]   Name: Avo Schlachter MRN:  [id number] Date of Birth: Apr 14, 1941

## 2017-03-11 ENCOUNTER — Ambulatory Visit: Payer: Medicare Other

## 2017-03-11 ENCOUNTER — Ambulatory Visit: Payer: Medicare Other | Admitting: Occupational Therapy

## 2017-03-11 ENCOUNTER — Ambulatory Visit: Payer: Medicare Other | Admitting: Physical Therapy

## 2017-03-24 ENCOUNTER — Ambulatory Visit: Payer: Medicare Other | Admitting: Rehabilitation

## 2017-03-24 ENCOUNTER — Ambulatory Visit: Payer: Medicare Other | Admitting: Speech Pathology

## 2017-03-24 ENCOUNTER — Ambulatory Visit: Payer: Medicare Other | Admitting: Occupational Therapy

## 2017-04-11 ENCOUNTER — Ambulatory Visit: Payer: Medicare Other | Admitting: Occupational Therapy

## 2017-04-11 ENCOUNTER — Encounter: Payer: Self-pay | Admitting: Occupational Therapy

## 2017-04-11 ENCOUNTER — Ambulatory Visit: Payer: Medicare Other | Admitting: Physical Therapy

## 2017-04-11 ENCOUNTER — Ambulatory Visit: Payer: Medicare Other | Attending: Internal Medicine | Admitting: Speech Pathology

## 2017-04-11 DIAGNOSIS — R293 Abnormal posture: Secondary | ICD-10-CM | POA: Insufficient documentation

## 2017-04-11 DIAGNOSIS — R208 Other disturbances of skin sensation: Secondary | ICD-10-CM | POA: Diagnosis present

## 2017-04-11 DIAGNOSIS — R29818 Other symptoms and signs involving the nervous system: Secondary | ICD-10-CM | POA: Diagnosis present

## 2017-04-11 DIAGNOSIS — I69352 Hemiplegia and hemiparesis following cerebral infarction affecting left dominant side: Secondary | ICD-10-CM | POA: Diagnosis present

## 2017-04-11 DIAGNOSIS — M25612 Stiffness of left shoulder, not elsewhere classified: Secondary | ICD-10-CM | POA: Diagnosis present

## 2017-04-11 DIAGNOSIS — M25512 Pain in left shoulder: Secondary | ICD-10-CM

## 2017-04-11 DIAGNOSIS — R4701 Aphasia: Secondary | ICD-10-CM | POA: Diagnosis present

## 2017-04-11 DIAGNOSIS — M25642 Stiffness of left hand, not elsewhere classified: Secondary | ICD-10-CM | POA: Insufficient documentation

## 2017-04-11 DIAGNOSIS — M25632 Stiffness of left wrist, not elsewhere classified: Secondary | ICD-10-CM | POA: Insufficient documentation

## 2017-04-11 DIAGNOSIS — G8929 Other chronic pain: Secondary | ICD-10-CM | POA: Diagnosis present

## 2017-04-11 DIAGNOSIS — I69318 Other symptoms and signs involving cognitive functions following cerebral infarction: Secondary | ICD-10-CM

## 2017-04-11 DIAGNOSIS — M6281 Muscle weakness (generalized): Secondary | ICD-10-CM | POA: Insufficient documentation

## 2017-04-11 NOTE — Therapy (Signed)
[facility] [address] [place], [place], [address] Phone: [phone]   Fax:  [phone]  Occupational Therapy Treatment  Patient Details  Name: Diana Bush MRN: [id number] Date of Birth: 03-12-1941 Referring Provider: [name]  Encounter Date: 04/11/2017      OT End of Session - 04/11/17 1635    Visit Number 5   Number of Visits 17   Date for OT Re-Evaluation 06/10/17   Authorization Type Medicare- Progress note and G code every 10th visit   Authorization - Visit Number 5   Authorization - Number of Visits 10   OT Start Time 1445   OT Stop Time 1530   OT Time Calculation (min) 45 min   Activity Tolerance Patient tolerated treatment well   Behavior During Therapy [facility] for tasks assessed/performed      Past Medical History:  Diagnosis Date  . Borderline hyperlipidemia   . Difficulty sleeping    DUE TO PAIN  . GERD (gastroesophageal reflux disease)   . Hypertension   . Neuromuscular disorder (HCC)   . Osteoarthritis   . PAF (paroxysmal atrial fibrillation) (HCC)   . Stroke [facility]) 07/2016    Past Surgical History:  Procedure Laterality Date  . IR GENERIC HISTORICAL  07/28/2016   IR PERCUTANEOUS ART THROMBECTOMY/INFUSION INTRACRANIAL INC DIAG ANGIO 07/28/2016 Julieanne Cotton, MD [facility]-INTERV RAD  . LYMPH NODE BIOPSY     BENIGN  . RADIOLOGY WITH ANESTHESIA N/A 07/28/2016   Procedure: RADIOLOGY WITH ANESTHESIA;  Surgeon: Julieanne Cotton, MD;  Location: [facility] OR;  Service: Radiology;  Laterality: N/A;  . TOTAL HIP ARTHROPLASTY Right 08/22/2015   Procedure: RIGHT TOTAL HIP ARTHROPLASTY ANTERIOR APPROACH;  Surgeon: Kathryne Hitch, MD;  Location: [facility] ORS;  Service: Orthopedics;  Laterality: Right;    There were no vitals filed for this visit.      Subjective Assessment - 04/11/17 1625    Subjective  Lean forward   Patient is accompained by: Family member   Currently in Pain? No/denies   Pain Score  0-No pain                      OT Treatments/Exercises (OP) - 04/11/17 0001      Neurological Re-education Exercises   Other Exercises 1 Neuromuscular reeducation to address postural control for sitting and preparing to transition from sitting to standing.  Patient pushes toward weaker left side, but with facilitation, and targets able to shift weight toward right side.  Patient very fearful of weight shift forward and to right.  Attempted sit to stand with assist of second person.  Patient able to lean forward sufficiently for transition, but unable to bear weight on legs to lift off.                  OT Education - 04/11/17 1635    Education provided Yes   Education Details Head hip relationship for slide board transfer   Person(s) Educated Patient;Spouse   Methods Explanation;Demonstration;Tactile cues;Verbal cues   Comprehension Need further instruction;Tactile cues required;Verbal cues required          OT Short Term Goals - 04/11/17 1638      OT SHORT TERM GOAL #1   Title Patient/caregiver will demonstrate competence with passive range of motion program for left arm due 03/12/2017   Status On-going     OT SHORT TERM GOAL #2   Title Patient will shift trunk forward in wheelchair with minimal assitance in  preparation for a level surface transfer    Status On-going     OT SHORT TERM GOAL #3   Title Patient will reach below knees to mid calf with min assist in preparation for assist with lower body dressing   Status Achieved     OT SHORT TERM GOAL #4   Title With mod assist, patient will scoot one hip forward on level surface in preparation for bedside commode transfer   Status Achieved     OT SHORT TERM GOAL #5   Title Patient will tolerate resting hand splint and wear 1-2 hours at a time during the day   Status On-going           OT Long Term Goals - 04/11/17 1638      OT LONG TERM GOAL #1   Title Patient will lean trunk forward from  wheelchair back without assistance in preparation for a transfer to drop arm commode due 04/12/2017   Status On-going     OT LONG TERM GOAL #2   Title Patient will scoot both hips forward on level surface in preparation for transfer to drop arm commode   Status On-going     OT LONG TERM GOAL #3   Title Patient will demonstrate passive range of motion in left shoulder to at least 110 degrees of flexion or abduction with pain no greater than 2/10 to aide in upper body dressing   Status On-going     OT LONG TERM GOAL #4   Title Patient will demonstrate sufficient hip flexion to weight shift forward while seated to reach to feet as needed for lower body bathing and dressing   Status On-going     OT LONG TERM GOAL #5   Title Patient will stand with max assist to aide caregivers with toilet hygiene and clothing management    Status On-going               Plan - 04/11/17 1636    Clinical Impression Statement Patient progressing with tolerance for weight shift forward and to right in preparation for transitional movements   Occupational performance deficits (Please refer to evaluation for details): ADL's;Rest and Sleep;Social Participation;IADL's   Rehab Potential Fair   Current Impairments/barriers affecting progress: severity of deficits   OT Frequency 2x / week   OT Duration 8 weeks   OT Treatment/Interventions Self-care/ADL training;Therapeutic exercise;Functional Mobility Training;Patient/family education;Balance training;Splinting;Manual Therapy;Neuromuscular education;Ultrasound;Therapeutic activities;DME and/or AE instruction;Electrical Stimulation;Cognitive remediation/compensation;Visual/perceptual remediation/compensation;Passive range of motion;Moist Heat   Plan need to complete splint and issue   Clinical Decision Making Multiple treatment options, significant modification of task necessary   Consulted and Agree with Plan of Care Patient;Family member/caregiver   Family  Member Consulted Husband Thayer OhmChris      Patient will benefit from skilled therapeutic intervention in order to improve the following deficits and impairments:  Decreased activity tolerance, Decreased balance, Decreased cognition, Decreased coordination, Decreased safety awareness, Decreased range of motion, Decreased mobility, Decreased knowledge of use of DME, Decreased knowledge of precautions, Decreased endurance, Decreased skin integrity, Decreased strength, Impaired tone, Impaired UE functional use, Other (comment), Pain, Obesity, Impaired sensation, Improper spinal/pelvic alignment, Improper body mechanics, Impaired perceived functional ability, Increased muscle spasms, Impaired vision/preception  Visit Diagnosis: Abnormal posture - Plan: Ot plan of care cert/re-cert  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) - Plan: Ot plan of care cert/re-cert  Chronic left shoulder pain - Plan: Ot plan of care cert/re-cert  Other disturbances of skin sensation -  Plan: Ot plan of care cert/re-cert  Other symptoms and signs involving the nervous system - Plan: Ot plan of care cert/re-cert  Muscle weakness (generalized) - Plan: Ot plan of care cert/re-cert  Other symptoms and signs involving cognitive functions following cerebral infarction - Plan: Ot plan of care cert/re-cert  Stiffness of left shoulder, not elsewhere classified - Plan: Ot plan of care cert/re-cert  Stiffness of left wrist, not elsewhere classified - Plan: Ot plan of care cert/re-cert  Stiffness of left hand, not elsewhere classified - Plan: Ot plan of care cert/re-cert      G-Codes - 04-30-2017 1639    Functional Limitation Self care   Self Care Current Status [phone]) At least 80 percent but less than 100 percent impaired, limited or restricted   Self Care Goal Status (U0454) At least 60 percent but less than 80 percent impaired, limited or restricted      Problem List Patient  Active Problem List   Diagnosis Date Noted  . Hypertensive emergency 08/04/2016  . Hypokalemia 08/04/2016  . Depression   . Cerebrovascular accident (CVA) due to thrombosis of right middle cerebral artery (HCC) s/p mechanical thrombectomy   . Hemiplegia affecting left nondominant side (HCC)   . HLD (hyperlipidemia)   . Dysphagia, post-stroke   . Dysarthria, post-stroke   . Expressive aphasia   . Acute blood loss anemia   . Benign essential HTN   . Atrial fibrillation with RVR (HCC) 07/29/2016  . Osteoarthritis of right hip 08/22/2015  . 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    Collier Salina, OTR/L 2017/04/30, 4:46 PM  [facility] [address] [place], [place], [address] Phone: [phone]   Fax:  [phone]  Name: Diana Bush MRN: [id number] Date of Birth: 1941/06/26

## 2017-04-11 NOTE — Therapy (Signed)
[facility] [address] [place], [place], [address] Phone: [phone]   Fax:  [phone]  Speech Language Pathology Treatment and Recertification  Patient Details  Name: Diana Bush MRN: [id number] Date of Birth: 05-01-1941 Referring Provider: Dr. Nolene Ebbs  Encounter Date: 04/11/2017      End of Session - 04/11/17 1747    Visit Number 8   Number of Visits 33   Date for SLP Re-Evaluation 06/06/17   SLP Start Time 1404   SLP Stop Time  1446   SLP Time Calculation (min) 42 min   Activity Tolerance Patient tolerated treatment well      Past Medical History:  Diagnosis Date  . Borderline hyperlipidemia   . Difficulty sleeping    DUE TO PAIN  . GERD (gastroesophageal reflux disease)   . Hypertension   . Neuromuscular disorder ([place])   . Osteoarthritis   . PAF (paroxysmal atrial fibrillation) ([place])   . Stroke [facility]) 07/2016    Past Surgical History:  Procedure Laterality Date  . IR GENERIC HISTORICAL  07/28/2016   IR PERCUTANEOUS ART THROMBECTOMY/INFUSION INTRACRANIAL INC DIAG ANGIO 07/28/2016 Luanne Bras, MD [facility]-INTERV RAD  . LYMPH NODE BIOPSY     BENIGN  . RADIOLOGY WITH ANESTHESIA N/A 07/28/2016   Procedure: RADIOLOGY WITH ANESTHESIA;  Surgeon: Luanne Bras, MD;  Location: [facility];  Service: Radiology;  Laterality: N/A;  . TOTAL HIP ARTHROPLASTY Right 08/22/2015   Procedure: RIGHT TOTAL HIP ARTHROPLASTY ANTERIOR APPROACH;  Surgeon: Mcarthur Rossetti, MD;  Location: [facility];  Service: Orthopedics;  Laterality: Right;    There were no vitals filed for this visit.      Subjective Assessment - 04/11/17 1414    Subjective "C-c-c-arl. T-t-tornado." (pt returning after 1 month; previous appointments cancelled due to transportation issue).   Patient is accompained by: Family member   Special Tests spouse   Currently in Pain? No/denies               ADULT SLP TREATMENT - 04/11/17 1416       General Information   Behavior/Cognition Alert;Cooperative;Pleasant mood   Patient Positioning Upright in chair   Oral care provided N/A     Treatment Provided   Treatment provided Cognitive-Linquistic     Pain Assessment   Pain Assessment No/denies pain     Cognitive-Linquistic Treatment   Treatment focused on Aphasia   Skilled Treatment SLP facilitated discussion of care plan and pt's goals; she expresses motivation to continue skilled ST in order to improve her ability to communicate. She stated, "remember to read...to bible... to teach... this... 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 She wishes to be able to read the bible and speak about it with friends at church. SLP provided short paragraphs of bible verses in large text; with visual structure provided, pt required moderate, consistent cues to attend to the left side of the page.      Assessment / Recommendations / Plan   Plan Other (Comment)  Goals extended     Progression Toward Goals   Progression toward goals Progressing toward goals          SLP Education - 04/11/17 1747    Education provided Yes   Education Details use of visual structure for left attention when reading   Person(s) Educated Patient;Spouse   Methods Explanation;Demonstration;Verbal cues   Comprehension Verbal cues required;Verbalized understanding;Need further instruction  SLP Short Term Goals - 04/11/17 1732      SLP SHORT TERM GOAL #1   Title Pt will complete moderately complex naming tasks with 90% accuracy and occasional min A.   Time 4   Period Weeks   Status Revised     SLP SHORT TERM GOAL #2   Title Pt will generate grammatical simple subject-verb-object sentences with 80% accuracy and occasional min A.   Time 4   Period Weeks   Status Revised     SLP SHORT TERM GOAL #3   Title Pt will attend to the left side of the page when reading  short 3-5 sentence paragraphs with occasional min A   Time 4   Period Weeks   Status Revised          SLP Long Term Goals - 04/11/17 1733      SLP LONG TERM GOAL #1   Title Pt will participate functionally in 5 minute conversation and occasional min A   Time 8   Period Weeks   Status On-going     SLP LONG TERM GOAL #2   Title Pt will generate 2 sentence utterances in structured tasks with 85% accuracy and occasional min A.   Time 8   Period Weeks   Status On-going          Plan - 04/11/17 1749    Clinical Impression Statement SLP focused session on left inattention as well as expressive language. Pt particularly engaged when discussing church, reading from the [place]. Suspect familiarity of passages improves her awareness of skipping words due to left neglect. Continue skilled ST to maximize verbal expression for QOL and to reduce caregiver burden. Recommend recertification for additional 8 weeks to address communication impairment. At time of most recent appointment, pt was making slow, steady progress toward goals, had not yet met short-term goals. Recommend continuing goals as written.   Speech Therapy Frequency 2x / week   Duration --  8 weeks or 16 additional visits   Treatment/Interventions Functional tasks;Patient/family education;Compensatory strategies;Cueing hierarchy;Multimodal communcation approach;Cognitive reorganization;Internal/external aids;SLP instruction and feedback;Language facilitation   Potential Considerations Severity of impairments   Consulted and Agree with Plan of Care Patient;Family member/caregiver   Family Member Consulted spouse      Patient will benefit from skilled therapeutic intervention in order to improve the following deficits and impairments:   Aphasia    Problem List Patient Active Problem List   Diagnosis Date Noted  . Hypertensive emergency 08/04/2016  . Hypokalemia 08/04/2016  . Depression   . Cerebrovascular accident (CVA)  due to thrombosis of right middle cerebral artery (HCC) s/p mechanical thrombectomy   . Hemiplegia affecting left nondominant side ([place])   . HLD (hyperlipidemia)   . Dysphagia, post-stroke   . Dysarthria, post-stroke   . Expressive aphasia   . Acute blood loss anemia   . Benign essential HTN   . Atrial fibrillation with RVR ([place]) 07/29/2016  . Osteoarthritis of right hip 08/22/2015  . 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   Deneise Lever, [place], [place] E Javel Hersh 04/11/2017, 5:54 PM  [facility] [address] [place], [place], [address] Phone: [phone]   Fax:  [phone]   Name: Diana Bush MRN: [id number] Date of Birth: [birth year]

## 2017-05-11 NOTE — Progress Notes (Signed)
Cardiology Office Note   Date:  05/12/2017   ID:  Diana Bush Baez, DOB 1941/04/25, MRN [id number]  PCP:  Fleet ContrasAvbuere, Edwin, MD  Cardiologist:   Rollene RotundaJames Roux Brandy, MD   Chief Complaint  Patient presents with  . Atrial Fibrillation      History of Present Illness: Diana Bush Cohoon is a 76 y.o. female who presents for follow up of atrial fib with an acute CVA.  She was in the hospital last year with this.  Echo demonstrated NL LV function with some TR and perhaps moderately elevated pulmonary pressures.  She had thrombectomy.   She has significant hemiparesis and dysarthria as residual. She gets around in a wheelchair. She understands me completely and I was able to understand fairly quickly when she was discussing. She was brought by transport didn't have any family with her. She still apparently works with PT.  The patient denies any new symptoms such as chest discomfort, neck or arm discomfort. There has been no new shortness of breath, PND or orthopnea. There have been no reported palpitations, presyncope or syncope.     Past Medical History:  Diagnosis Date  . Borderline hyperlipidemia   . Difficulty sleeping    DUE TO PAIN  . GERD (gastroesophageal reflux disease)   . Hypertension   . Neuromuscular disorder ([facility])   . Osteoarthritis   . PAF (paroxysmal atrial fibrillation) ([facility])   . Stroke [facility](H[facility]) 07/2016    Past Surgical History:  Procedure Laterality Date  . IR GENERIC HISTORICAL  07/28/2016   IR PERCUTANEOUS ART THROMBECTOMY/INFUSION INTRACRANIAL INC DIAG ANGIO 07/28/2016 [name], MD [facility]-INTERV RAD  . LYMPH NODE BIOPSY     BENIGN  . RADIOLOGY WITH ANESTHESIA N/A 07/28/2016   Procedure: RADIOLOGY WITH ANESTHESIA;  Surgeon: [name], MD;  [facility];  Service: Radiology;  Laterality: N/A;  . TOTAL HIP ARTHROPLASTY Right 08/22/2015   Procedure: RIGHT TOTAL HIP ARTHROPLASTY ANTERIOR APPROACH;  Surgeon: Kathryne Hitchhristopher Y Blackman, MD;  [facility];  Service:  Orthopedics;  Laterality: Right;     Current Outpatient Prescriptions  Medication Sig Dispense Refill  . amLODipine (NORVASC) 2.5 MG tablet Take 1 tablet (2.5 mg total) by mouth daily. 30 tablet 11  . apixaban (ELIQUIS) 5 MG TABS tablet Take 5 mg by mouth 2 (two) times daily.    Marland Kitchen. atorvastatin (LIPITOR) 20 MG tablet Take 1 tablet (20 mg total) by mouth daily at 6 PM. 30 tablet 2  . baclofen (LIORESAL) 10 MG tablet Take 10 mg by mouth 3 (three) times daily. 09/08/16 as needed    . gabapentin (NEURONTIN) 300 MG capsule Take 300 mg by mouth 3 (three) times daily.  2  . HYDROcodone-acetaminophen (NORCO/VICODIN) 5-325 MG tablet Take 2 tablets by mouth every 6 (six) hours as needed. 10 tablet 0  . lidocaine (LIDODERM) 5 % Place 1 patch onto the skin daily as needed (pain). Remove & Discard patch within 12 hours or as directed by MD     . losartan-hydrochlorothiazide (HYZAAR) 100-25 MG tablet Take 1 tablet by mouth daily.     . metoprolol (LOPRESSOR) 50 MG tablet Take 50 mg by mouth 2 (two) times daily.     . sertraline (ZOLOFT) 50 MG tablet Take 1 tablet (50 mg total) by mouth at bedtime. 30 tablet 2  . tizanidine (ZANAFLEX) 2 MG capsule Take 2 mg by mouth 3 (three) times daily as needed for muscle spasms.      No current facility-administered medications for this visit.  Allergies:   Patient has no known allergies.    ROS:  Please see the history of present illness.   Otherwise, review of systems are positive for cold intolerance.   All other systems are reviewed and negative.    PHYSICAL EXAM: VS:  BP 124/64   Pulse (!) 54   Ht 5\' 5"  (1.651 m)   SpO2 97%  , BMI There is no height or weight on file to calculate BMI.  GEN:  No distress NECK:  No jugular venous distention at 90 degrees, waveform within normal limits, carotid upstroke brisk and symmetric, no bruits, no thyromegaly LYMPHATICS:  No cervical adenopathy LUNGS:  Clear to auscultation bilaterally BACK:  No CVA  tenderness CHEST:  Unremarkable HEART:  S1 and S2 within normal limits, no S3, no S4, no clicks, no rubs, no murmurs ABD:  Positive bowel sounds normal in frequency in pitch, no bruits, no rebound, no guarding, unable to assess midline mass or bruit with the patient seated. EXT:  2 plus pulses throughout, moderate edema, no cyanosis no clubbing SKIN:  No rashes no nodules NEURO:  Dysarthria, dense hemiparesis PSYCH:  Cognitively intact.      EKG:  EKG is not  ordered today.   Recent Labs: 07/30/2016: Magnesium 1.9 10/20/2016: ALT 18; B Natriuretic Peptide 246.0; BUN 9; Creatinine, Ser 0.60; Hemoglobin 11.0; Platelets 232; Potassium 3.6; Sodium 137    Lipid Panel    Component Value Date/Time   CHOL 143 07/29/2016 0500   TRIG 69 07/31/2016 0149   HDL 43 07/29/2016 0500   CHOLHDL 3.3 07/29/2016 0500   VLDL 10 07/29/2016 0500   LDLCALC 90 07/29/2016 0500      Wt Readings from Last 3 Encounters:  10/20/16 171 lb (77.6 kg)  08/18/16 171 lb (77.6 kg)  08/05/16 171 lb 6.4 oz (77.7 kg)      Other studies Reviewed: Additional studies/ records that were reviewed today include:  None Review of the above records demonstrates:     ASSESSMENT AND PLAN:    ATRIAL FIB:  Ms. Shirleyann Montero has a CHA2DS2 - VASc score of 6.    She needs to remain on anticoagulation.  She does not have a recent CBC.   I will order a CBC.    HTN:   The blood pressure is at target. No change in medications is indicated. We will continue with therapeutic lifestyle changes (TLC).   Current medicines are reviewed at length with the patient today.  The patient does not have concerns regarding medicines.  The following changes have been made:    Labs/ tests ordered today include:    Orders Placed This Encounter  Procedures  . CBC     Disposition:   FU with me in 12 months.    Signed, Rollene Rotunda, MD  05/12/2017 9:34 PM    [facility]

## 2017-05-12 ENCOUNTER — Encounter: Payer: Self-pay | Admitting: Cardiology

## 2017-05-12 ENCOUNTER — Ambulatory Visit (INDEPENDENT_AMBULATORY_CARE_PROVIDER_SITE_OTHER): Payer: Medicare Other | Admitting: Cardiology

## 2017-05-12 VITALS — BP 124/64 | HR 54 | Ht 65.0 in

## 2017-05-12 DIAGNOSIS — I10 Essential (primary) hypertension: Secondary | ICD-10-CM

## 2017-05-12 DIAGNOSIS — Z79899 Other long term (current) drug therapy: Secondary | ICD-10-CM

## 2017-05-12 DIAGNOSIS — I4819 Other persistent atrial fibrillation: Secondary | ICD-10-CM | POA: Insufficient documentation

## 2017-05-12 DIAGNOSIS — I481 Persistent atrial fibrillation: Secondary | ICD-10-CM | POA: Diagnosis not present

## 2017-05-12 NOTE — Patient Instructions (Signed)
Medication Instructions:  Your physician recommends that you continue on your current medications as directed. Please refer to the Current Medication list given to you today.  Labwork: TODAY-CBC   Follow-Up: Your physician wants you to follow-up in: 1 YEAR with Dr. Antoine PocheHochrein. You will receive a reminder letter in the mail two months in advance. If you don't receive a letter, please call our office to schedule the follow-up appointment.   Any Other Special Instructions Will Be Listed Below (If Applicable).     If you need a refill on your cardiac medications before your next appointment, please call your pharmacy.

## 2017-05-13 LAB — CBC
HEMATOCRIT: 33.4 % — AB (ref 34.0–46.6)
Hemoglobin: 10.8 g/dL — ABNORMAL LOW (ref 11.1–15.9)
MCH: 27.1 pg (ref 26.6–33.0)
MCHC: 32.3 g/dL (ref 31.5–35.7)
MCV: 84 fL (ref 79–97)
Platelets: 234 10*3/uL (ref 150–379)
RBC: 3.98 x10E6/uL (ref 3.77–5.28)
RDW: 15 % (ref 12.3–15.4)
WBC: 8.1 10*3/uL (ref 3.4–10.8)

## 2017-07-20 ENCOUNTER — Encounter: Payer: Self-pay | Admitting: Occupational Therapy

## 2017-07-20 NOTE — Therapy (Signed)
[facility] [address] [place], [place], [address] Phone: [phone]   Fax:  [phone]  Patient Details  Name: Diana Bush MRN: [id number] Date of Birth: 03-18-1941 Referring Provider:  No ref. provider found  Encounter Date: 07/20/2017 OCCUPATIONAL THERAPY DISCHARGE SUMMARY  Visits from Start of Care: 5  Current functional level related to goals / functional outcomes: Unable to comment, patient has not been seen since early June 2018.     Remaining deficits:  Unable to comment, patient has not been seen since early June 2018.   Education / Equipment: incomplete Plan: Patient agrees to discharge.  Patient goals were not met. Patient is being discharged due to not returning since the last visit.  ?????      Mariah Milling, OTR/L 07/20/2017, 2:38 PM  [facility] [address] [place], [place], [address] Phone: [phone]   Fax:  [phone]

## 2017-07-22 ENCOUNTER — Emergency Department (HOSPITAL_COMMUNITY): Payer: Medicare Other

## 2017-07-22 ENCOUNTER — Encounter (HOSPITAL_COMMUNITY): Payer: Self-pay

## 2017-07-22 ENCOUNTER — Emergency Department (HOSPITAL_COMMUNITY)
Admission: EM | Admit: 2017-07-22 | Discharge: 2017-07-22 | Disposition: A | Discharge status: Home / Self Care | Payer: Medicare Other | Attending: Emergency Medicine | Admitting: Emergency Medicine

## 2017-07-22 DIAGNOSIS — Z791 Long term (current) use of non-steroidal anti-inflammatories (NSAID): Secondary | ICD-10-CM | POA: Diagnosis not present

## 2017-07-22 DIAGNOSIS — Z7901 Long term (current) use of anticoagulants: Secondary | ICD-10-CM | POA: Insufficient documentation

## 2017-07-22 DIAGNOSIS — Z79899 Other long term (current) drug therapy: Secondary | ICD-10-CM | POA: Insufficient documentation

## 2017-07-22 DIAGNOSIS — I4891 Unspecified atrial fibrillation: Secondary | ICD-10-CM | POA: Insufficient documentation

## 2017-07-22 DIAGNOSIS — R29810 Facial weakness: Secondary | ICD-10-CM | POA: Diagnosis not present

## 2017-07-22 DIAGNOSIS — I10 Essential (primary) hypertension: Secondary | ICD-10-CM | POA: Diagnosis not present

## 2017-07-22 DIAGNOSIS — Z8673 Personal history of transient ischemic attack (TIA), and cerebral infarction without residual deficits: Secondary | ICD-10-CM | POA: Insufficient documentation

## 2017-07-22 LAB — I-STAT CHEM 8, ED
BUN: 26 mg/dL — ABNORMAL HIGH (ref 6–20)
CALCIUM ION: 1.18 mmol/L (ref 1.15–1.40)
CREATININE: 0.9 mg/dL (ref 0.44–1.00)
Chloride: 102 mmol/L (ref 101–111)
GLUCOSE: 96 mg/dL (ref 65–99)
HCT: 34 % — ABNORMAL LOW (ref 36.0–46.0)
HEMOGLOBIN: 11.6 g/dL — AB (ref 12.0–15.0)
Potassium: 3.7 mmol/L (ref 3.5–5.1)
Sodium: 142 mmol/L (ref 135–145)
TCO2: 29 mmol/L (ref 22–32)

## 2017-07-22 LAB — CBC
HEMATOCRIT: 36.5 % (ref 36.0–46.0)
HEMOGLOBIN: 11.3 g/dL — AB (ref 12.0–15.0)
MCH: 27.1 pg (ref 26.0–34.0)
MCHC: 31 g/dL (ref 30.0–36.0)
MCV: 87.5 fL (ref 78.0–100.0)
Platelets: 205 10*3/uL (ref 150–400)
RBC: 4.17 MIL/uL (ref 3.87–5.11)
RDW: 13.1 % (ref 11.5–15.5)
WBC: 7.6 10*3/uL (ref 4.0–10.5)

## 2017-07-22 LAB — DIFFERENTIAL
Basophils Absolute: 0 10*3/uL (ref 0.0–0.1)
Basophils Relative: 0 %
Eosinophils Absolute: 0.1 10*3/uL (ref 0.0–0.7)
Eosinophils Relative: 2 %
LYMPHS ABS: 1.6 10*3/uL (ref 0.7–4.0)
Lymphocytes Relative: 21 %
MONO ABS: 0.4 10*3/uL (ref 0.1–1.0)
MONOS PCT: 5 %
Neutro Abs: 5.5 10*3/uL (ref 1.7–7.7)
Neutrophils Relative %: 72 %

## 2017-07-22 LAB — URINALYSIS, ROUTINE W REFLEX MICROSCOPIC
BILIRUBIN URINE: NEGATIVE
GLUCOSE, UA: NEGATIVE mg/dL
HGB URINE DIPSTICK: NEGATIVE
Ketones, ur: NEGATIVE mg/dL
LEUKOCYTES UA: NEGATIVE
Nitrite: NEGATIVE
PH: 5 (ref 5.0–8.0)
PROTEIN: NEGATIVE mg/dL
Specific Gravity, Urine: 1.017 (ref 1.005–1.030)

## 2017-07-22 LAB — COMPREHENSIVE METABOLIC PANEL
ALT: 16 U/L (ref 14–54)
AST: 22 U/L (ref 15–41)
Albumin: 3.8 g/dL (ref 3.5–5.0)
Alkaline Phosphatase: 78 U/L (ref 38–126)
Anion gap: 8 (ref 5–15)
BILIRUBIN TOTAL: 0.7 mg/dL (ref 0.3–1.2)
BUN: 23 mg/dL — AB (ref 6–20)
CHLORIDE: 103 mmol/L (ref 101–111)
CO2: 27 mmol/L (ref 22–32)
CREATININE: 0.89 mg/dL (ref 0.44–1.00)
Calcium: 9.4 mg/dL (ref 8.9–10.3)
Glucose, Bld: 95 mg/dL (ref 65–99)
POTASSIUM: 3.6 mmol/L (ref 3.5–5.1)
Sodium: 138 mmol/L (ref 135–145)
TOTAL PROTEIN: 7.8 g/dL (ref 6.5–8.1)

## 2017-07-22 LAB — I-STAT TROPONIN, ED: TROPONIN I, POC: 0.03 ng/mL (ref 0.00–0.08)

## 2017-07-22 LAB — PROTIME-INR
INR: 1.22
Prothrombin Time: 15.3 seconds — ABNORMAL HIGH (ref 11.4–15.2)

## 2017-07-22 LAB — APTT: aPTT: 30 seconds (ref 24–36)

## 2017-07-22 MED ORDER — SODIUM CHLORIDE 0.9 % IV BOLUS (SEPSIS)
500.0000 mL | Freq: Once | INTRAVENOUS | Status: AC
  Administered 2017-07-22: 500 mL via INTRAVENOUS

## 2017-07-22 NOTE — ED Notes (Signed)
Patient transported to MRI 

## 2017-07-22 NOTE — ED Triage Notes (Signed)
Pt BIB [facility] from home where pt lives with daughter. EMS was called r/t pt "screaming and moaning" more than normal. Pt has hx. Of stroke with left sided deficits, today family reports new left sided facial droop. Pt c/o pain behind right ear. LSN 1800, 07-21-17.
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
## 2017-07-22 NOTE — Discharge Instructions (Signed)
Make sure that he have close follow-up with your primary care physician. Return to the hospital if you have any worsening symptoms.

## 2017-07-22 NOTE — ED Notes (Signed)
Daughter at bedside reports new medication of Amlodipine and Atorvastatin. Daughter thinks pt is having a reaction to new medication. She also reports pt urine has had foul/strong odor.

## 2017-07-22 NOTE — ED Provider Notes (Signed)
Reports the patient had left-sided facial droopto include eyelid and mouth onset last night which has resolved presently. She also has been stutteringsince yesterday  her speech is not normal presently. Her daughter statesthat she was more "loopy" meaning less energetic since amlodipine was added to her medication as well as metoprolol dosage increased 7 days ago by Dr. All PerryPatient is alert. HEENT exam no facial asymmetry. Speech is clear. I discussed case with Dr.Lindzen, neurologist who does not feel that patient has had an acute stroke. Needs no further workup. Patient daughter satisfied with that.I've advised patient to stop amlodipine until she can follow up with Dr.Avbuere next week.  Anemia is chronic. Symptoms are nonspecific Results for orders placed or performed during the hospital encounter of 07/22/17  Protime-INR  Result Value Ref Range   Prothrombin Time 15.3 (H) 11.4 - 15.2 seconds   INR 1.22   APTT  Result Value Ref Range   aPTT 30 24 - 36 seconds  CBC  Result Value Ref Range   WBC 7.6 4.0 - 10.5 K/uL   RBC 4.17 3.87 - 5.11 MIL/uL   Hemoglobin 11.3 (L) 12.0 - 15.0 g/dL   HCT 16.1 09.6 - 04.5 %   MCV 87.5 78.0 - 100.0 fL   MCH 27.1 26.0 - 34.0 pg   MCHC 31.0 30.0 - 36.0 g/dL   RDW 40.9 81.1 - 91.4 %   Platelets 205 150 - 400 K/uL  Differential  Result Value Ref Range   Neutrophils Relative % 72 %   Neutro Abs 5.5 1.7 - 7.7 K/uL   Lymphocytes Relative 21 %   Lymphs Abs 1.6 0.7 - 4.0 K/uL   Monocytes Relative 5 %   Monocytes Absolute 0.4 0.1 - 1.0 K/uL   Eosinophils Relative 2 %   Eosinophils Absolute 0.1 0.0 - 0.7 K/uL   Basophils Relative 0 %   Basophils Absolute 0.0 0.0 - 0.1 K/uL  Comprehensive metabolic panel  Result Value Ref Range   Sodium 138 135 - 145 mmol/L   Potassium 3.6 3.5 - 5.1 mmol/L   Chloride 103 101 - 111 mmol/L   CO2 27 22 - 32 mmol/L   Glucose, Bld 95 65 - 99 mg/dL   BUN 23 (H) 6 - 20 mg/dL   Creatinine, Ser 7.82 0.44 - 1.00 mg/dL    Calcium 9.4 8.9 - 95.6 mg/dL   Total Protein 7.8 6.5 - 8.1 g/dL   Albumin 3.8 3.5 - 5.0 g/dL   AST 22 15 - 41 U/L   ALT 16 14 - 54 U/L   Alkaline Phosphatase 78 38 - 126 U/L   Total Bilirubin 0.7 0.3 - 1.2 mg/dL   GFR calc non Af Amer >60 >60 mL/min   GFR calc Af Amer >60 >60 mL/min   Anion gap 8 5 - 15  Urinalysis, Routine w reflex microscopic  Result Value Ref Range   Color, Urine YELLOW YELLOW   APPearance CLEAR CLEAR   Specific Gravity, Urine 1.017 1.005 - 1.030   pH 5.0 5.0 - 8.0   Glucose, UA NEGATIVE NEGATIVE mg/dL   Hgb urine dipstick NEGATIVE NEGATIVE   Bilirubin Urine NEGATIVE NEGATIVE   Ketones, ur NEGATIVE NEGATIVE mg/dL   Protein, ur NEGATIVE NEGATIVE mg/dL   Nitrite NEGATIVE NEGATIVE   Leukocytes, UA NEGATIVE NEGATIVE  I-Stat Chem 8, ED  Result Value Ref Range   Sodium 142 135 - 145 mmol/L   Potassium 3.7 3.5 - 5.1 mmol/L   Chloride 102 101 -  111 mmol/L   BUN 26 (H) 6 - 20 mg/dL   Creatinine, Ser 1.61 0.44 - 1.00 mg/dL   Glucose, Bld 96 65 - 99 mg/dL   Calcium, Ion 0.96 0.45 - 1.40 mmol/L   TCO2 29 22 - 32 mmol/L   Hemoglobin 11.6 (L) 12.0 - 15.0 g/dL   HCT 40.9 (L) 81.1 - 91.4 %  I-stat troponin, ED  Result Value Ref Range   Troponin i, poc 0.03 0.00 - 0.08 ng/mL   Comment 3           Ct Head Wo Contrast  Result Date: 07/22/2017 CLINICAL DATA:  Right-sided headache. EXAM: CT HEAD WITHOUT CONTRAST TECHNIQUE: Contiguous axial images were obtained from the base of the skull through the vertex without intravenous contrast. COMPARISON:  None. FINDINGS: Brain: Large right frontal encephalomalacia is noted consistent with old infarction. No mass effect or midline shift is noted. Ventricular size is within normal limits. There is no evidence of mass lesion, hemorrhage or acute infarction. Vascular: No hyperdense vessel or unexpected calcification. Skull: Normal. Negative for fracture or focal lesion. Sinuses/Orbits: No acute finding. Other: None. IMPRESSION: Old right  MCA infarction.  No acute intracranial abnormality seen. Electronically Signed   By: Lupita Raider, M.D.   On: 07/22/2017 13:44   Mr Brain Wo Contrast  Result Date: 07/22/2017 CLINICAL DATA:  Initial evaluation for acute focal neuro deficits, suspected stroke. EXAM: MRI HEAD WITHOUT CONTRAST TECHNIQUE: Multiplanar, multiecho pulse sequences of the brain and surrounding structures were obtained without intravenous contrast. COMPARISON:  Prior CT from earlier the same day. FINDINGS: Brain: Generalized age-related cerebral volume loss. Mild chronic microvascular ischemic disease within the periventricular and deep white matter both cerebral hemispheres appear chronic micro vessel ischemic changes present within the pons as well. Encephalomalacia with gliosis involving the posterior right MCA territory, consistent with remote infarct. Associated scattered chronic hemosiderin staining. Mild wallerian degeneration noted at the right cerebral peduncle/midbrain. No evidence for acute for subacute infarct. Gray-white matter differentiation otherwise maintained. No evidence for acute intracranial hemorrhage. No mass lesion, midline shift or mass effect. No hydrocephalus. Mild ex vacuo dilatation of the right lateral ventricle related to prior right MCA infarct. No extra-axial fluid collection para major dural sinuses grossly patent. Instilled omitted partially empty sella. Midline structures intact and normal. Vascular: Major intracranial vascular flow voids maintained. Skull and upper cervical spine: Craniocervical junction normal. Degenerative thickening of the tectorial membrane without significant stenosis. Upper cervical spine within normal limits. Bone marrow signal intensity normal. No scalp soft tissue abnormality. Sinuses/Orbits: Globes normal soft tissues within normal limits. Paranasal sinuses are clear. No mastoid effusion. Inner ear structures normal. Other: None. IMPRESSION: 1. No acute intracranial  abnormality identified. 2. Large remote right MCA territory infarct. 3. Mild age-related cerebral atrophy with chronic small vessel ischemic disease. Electronically Signed   By: Rise Mu M.D.   On: 07/22/2017 17:40     Doug Sou, MD 07/22/17 [phone]

## 2017-07-22 NOTE — ED Notes (Signed)
ED Provider at bedside. 

## 2017-08-08 ENCOUNTER — Encounter: Payer: Self-pay | Admitting: Speech Pathology

## 2017-08-08 NOTE — Therapy (Signed)
SPEECH THERAPY DISCHARGE SUMMARY  Visits from Start of Care: 8  Current functional level related to goals / functional outcomes: See goals below   Remaining deficits: Pt continues to have impairments in expressive language, left inattention.    Education / Equipment: Aphasia and compensations      SLP Short Term Goals - 08/08/17 0942      SLP SHORT TERM GOAL #1   Title Pt will complete moderately complex naming tasks with 90% accuracy and occasional min A.   Status Not Met     SLP SHORT TERM GOAL #2   Title Pt will generate grammatical simple subject-verb-object sentences with 80% accuracy and occasional min A.   Status Not Met     SLP SHORT TERM GOAL #3   Title Pt will attend to the left side of the page when reading short 3-5 sentence paragraphs with occasional min A   Status Not Met         SLP Long Term Goals - 08/08/17 4451      SLP LONG TERM GOAL #1   Title Pt will participate functionally in 5 minute conversation and occasional min A   Status Not Met     SLP LONG TERM GOAL #2   Title Pt will generate 2 sentence utterances in structured tasks with 85% accuracy and occasional min A.   Status Not Met      Plan: Patient agrees to discharge.  Patient goals were not met. Patient is being discharged due to not returning since the last visit.  ?????        [facility] [address] [place], [place], [address] Phone: [phone]   Fax:  [phone]  Patient Details  Name: Diana Bush MRN: [id number] Date of Birth: 1941/02/22 Referring Provider:  No ref. provider found  Encounter Date: 08/08/2017  Deneise Lever, [place], [place] 08/08/2017, 9:43 AM  [facility] [address] [place], [place], [address] Phone: [phone]   Fax:  [phone]

## 2018-02-20 ENCOUNTER — Inpatient Hospital Stay
Admission: EM | Admit: 2018-02-20 | Discharge: 2018-02-23 | DRG: 101 | Disposition: A | Discharge status: Skilled Nursing Facility | Payer: Medicare Other | Attending: Internal Medicine | Admitting: Internal Medicine

## 2018-02-20 ENCOUNTER — Other Ambulatory Visit: Payer: Self-pay

## 2018-02-20 ENCOUNTER — Emergency Department: Payer: Medicare Other

## 2018-02-20 ENCOUNTER — Inpatient Hospital Stay: Payer: Medicare Other

## 2018-02-20 ENCOUNTER — Encounter: Payer: Self-pay | Admitting: Emergency Medicine

## 2018-02-20 DIAGNOSIS — Z820 Family history of epilepsy and other diseases of the nervous system: Secondary | ICD-10-CM | POA: Diagnosis not present

## 2018-02-20 DIAGNOSIS — I69322 Dysarthria following cerebral infarction: Secondary | ICD-10-CM | POA: Diagnosis not present

## 2018-02-20 DIAGNOSIS — Z7901 Long term (current) use of anticoagulants: Secondary | ICD-10-CM

## 2018-02-20 DIAGNOSIS — R569 Unspecified convulsions: Principal | ICD-10-CM

## 2018-02-20 DIAGNOSIS — I10 Essential (primary) hypertension: Secondary | ICD-10-CM | POA: Diagnosis present

## 2018-02-20 DIAGNOSIS — I480 Paroxysmal atrial fibrillation: Secondary | ICD-10-CM | POA: Diagnosis not present

## 2018-02-20 DIAGNOSIS — I69354 Hemiplegia and hemiparesis following cerebral infarction affecting left non-dominant side: Secondary | ICD-10-CM

## 2018-02-20 DIAGNOSIS — G9349 Other encephalopathy: Secondary | ICD-10-CM | POA: Diagnosis not present

## 2018-02-20 DIAGNOSIS — F329 Major depressive disorder, single episode, unspecified: Secondary | ICD-10-CM | POA: Diagnosis present

## 2018-02-20 DIAGNOSIS — E876 Hypokalemia: Secondary | ICD-10-CM | POA: Diagnosis present

## 2018-02-20 DIAGNOSIS — R4182 Altered mental status, unspecified: Secondary | ICD-10-CM

## 2018-02-20 DIAGNOSIS — K219 Gastro-esophageal reflux disease without esophagitis: Secondary | ICD-10-CM | POA: Diagnosis not present

## 2018-02-20 DIAGNOSIS — Z96641 Presence of right artificial hip joint: Secondary | ICD-10-CM | POA: Diagnosis not present

## 2018-02-20 LAB — COMPREHENSIVE METABOLIC PANEL
ALT: 15 U/L (ref 14–54)
AST: 28 U/L (ref 15–41)
Albumin: 4.3 g/dL (ref 3.5–5.0)
Alkaline Phosphatase: 104 U/L (ref 38–126)
Anion gap: 11 (ref 5–15)
BUN: 32 mg/dL — ABNORMAL HIGH (ref 6–20)
CHLORIDE: 101 mmol/L (ref 101–111)
CO2: 28 mmol/L (ref 22–32)
Calcium: 9.4 mg/dL (ref 8.9–10.3)
Creatinine, Ser: 1.11 mg/dL — ABNORMAL HIGH (ref 0.44–1.00)
GFR, EST AFRICAN AMERICAN: 54 mL/min — AB (ref >60)
GFR, EST NON AFRICAN AMERICAN: 47 mL/min — AB (ref >60)
Glucose, Bld: 135 mg/dL — ABNORMAL HIGH (ref 65–99)
POTASSIUM: 2.9 mmol/L — AB (ref 3.5–5.1)
Sodium: 140 mmol/L (ref 135–145)
Total Bilirubin: 0.5 mg/dL (ref 0.3–1.2)
Total Protein: 8.6 g/dL — ABNORMAL HIGH (ref 6.5–8.1)

## 2018-02-20 LAB — URINALYSIS, COMPLETE (UACMP) WITH MICROSCOPIC
BILIRUBIN URINE: NEGATIVE
Bacteria, UA: NONE SEEN
GLUCOSE, UA: NEGATIVE mg/dL
HGB URINE DIPSTICK: NEGATIVE
Ketones, ur: NEGATIVE mg/dL
LEUKOCYTES UA: NEGATIVE
NITRITE: NEGATIVE
PH: 5 (ref 5.0–8.0)
Protein, ur: NEGATIVE mg/dL
SPECIFIC GRAVITY, URINE: 1.018 (ref 1.005–1.030)

## 2018-02-20 LAB — LIPASE, BLOOD: LIPASE: 36 U/L (ref 11–51)

## 2018-02-20 LAB — URINE DRUG SCREEN, QUALITATIVE (ARMC ONLY)
Amphetamines, Ur Screen: NOT DETECTED
Barbiturates, Ur Screen: NOT DETECTED
Benzodiazepine, Ur Scrn: NOT DETECTED
COCAINE METABOLITE, UR ~~LOC~~: NOT DETECTED
Cannabinoid 50 Ng, Ur ~~LOC~~: NOT DETECTED
MDMA (ECSTASY) UR SCREEN: NOT DETECTED
METHADONE SCREEN, URINE: NOT DETECTED
OPIATE, UR SCREEN: NOT DETECTED
Phencyclidine (PCP) Ur S: NOT DETECTED
Tricyclic, Ur Screen: NOT DETECTED

## 2018-02-20 LAB — CBC WITH DIFFERENTIAL/PLATELET
BASOS ABS: 0 10*3/uL (ref 0–0.1)
Basophils Relative: 1 %
EOS ABS: 0.1 10*3/uL (ref 0–0.7)
EOS PCT: 1 %
HCT: 35.7 % (ref 35.0–47.0)
Hemoglobin: 11.6 g/dL — ABNORMAL LOW (ref 12.0–16.0)
LYMPHS ABS: 2.1 10*3/uL (ref 1.0–3.6)
LYMPHS PCT: 20 %
MCH: 27.9 pg (ref 26.0–34.0)
MCHC: 32.5 g/dL (ref 32.0–36.0)
MCV: 85.8 fL (ref 80.0–100.0)
MONO ABS: 0.5 10*3/uL (ref 0.2–0.9)
Monocytes Relative: 5 %
Neutro Abs: 7.8 10*3/uL — ABNORMAL HIGH (ref 1.4–6.5)
Neutrophils Relative %: 73 %
PLATELETS: 196 10*3/uL (ref 150–440)
RBC: 4.16 MIL/uL (ref 3.80–5.20)
RDW: 14.7 % — AB (ref 11.5–14.5)
WBC: 10.6 10*3/uL (ref 3.6–11.0)

## 2018-02-20 LAB — TROPONIN I

## 2018-02-20 LAB — PROTIME-INR
INR: 1.16
Prothrombin Time: 14.7 seconds (ref 11.4–15.2)

## 2018-02-20 LAB — ETHANOL: Alcohol, Ethyl (B): <10 mg/dL (ref <10)

## 2018-02-20 LAB — APTT: aPTT: 28 seconds (ref 24–36)

## 2018-02-20 LAB — GLUCOSE, CAPILLARY: Glucose-Capillary: 105 mg/dL — ABNORMAL HIGH (ref 65–99)

## 2018-02-20 MED ORDER — GABAPENTIN 300 MG PO CAPS
300.0000 mg | ORAL_CAPSULE | Freq: Three times a day (TID) | ORAL | Status: DC
  Administered 2018-02-21 – 2018-02-23 (×6): 300 mg via ORAL
  Filled 2018-02-20 (×6): qty 1

## 2018-02-20 MED ORDER — ACETAMINOPHEN 650 MG RE SUPP: 650.0000 mg | Freq: Four times a day (QID) | RECTAL | Status: DC | PRN

## 2018-02-20 MED ORDER — SODIUM CHLORIDE 0.9 % IV SOLN
INTRAVENOUS | Status: DC
  Administered 2018-02-20: 17:00:00 via INTRAVENOUS

## 2018-02-20 MED ORDER — SODIUM CHLORIDE 0.9 % IV SOLN
50.0000 mg | Freq: Two times a day (BID) | INTRAVENOUS | Status: DC
  Administered 2018-02-20 – 2018-02-21 (×3): 50 mg via INTRAVENOUS
  Filled 2018-02-20 (×5): qty 5

## 2018-02-20 MED ORDER — SODIUM CHLORIDE 0.9 % IV SOLN
200.0000 mg | Freq: Once | INTRAVENOUS | Status: AC
  Administered 2018-02-20: 200 mg via INTRAVENOUS
  Filled 2018-02-20: qty 20

## 2018-02-20 MED ORDER — ALBUTEROL SULFATE (2.5 MG/3ML) 0.083% IN NEBU: 2.5000 mg | INHALATION_SOLUTION | RESPIRATORY_TRACT | Status: DC | PRN

## 2018-02-20 MED ORDER — ATORVASTATIN CALCIUM 20 MG PO TABS
20.0000 mg | ORAL_TABLET | Freq: Every day | ORAL | Status: DC
  Administered 2018-02-21 – 2018-02-22 (×2): 20 mg via ORAL
  Filled 2018-02-20 (×2): qty 1

## 2018-02-20 MED ORDER — POLYETHYLENE GLYCOL 3350 17 G PO PACK: 17.0000 g | PACK | Freq: Every day | ORAL | Status: DC | PRN

## 2018-02-20 MED ORDER — ACETAMINOPHEN 325 MG PO TABS
650.0000 mg | ORAL_TABLET | Freq: Four times a day (QID) | ORAL | Status: DC | PRN
  Administered 2018-02-22: 04:00:00 650 mg via ORAL
  Filled 2018-02-20: qty 2

## 2018-02-20 MED ORDER — LOSARTAN POTASSIUM-HCTZ 100-25 MG PO TABS: 1.0000 | ORAL_TABLET | Freq: Every day | ORAL | Status: DC

## 2018-02-20 MED ORDER — ONDANSETRON HCL 4 MG/2ML IJ SOLN
4.0000 mg | Freq: Four times a day (QID) | INTRAMUSCULAR | Status: DC | PRN
  Administered 2018-02-20: 4 mg via INTRAVENOUS
  Filled 2018-02-20: qty 2

## 2018-02-20 MED ORDER — POTASSIUM CHLORIDE 10 MEQ/100ML IV SOLN
10.0000 meq | INTRAVENOUS | Status: AC
  Administered 2018-02-20 (×5): 10 meq via INTRAVENOUS
  Filled 2018-02-20 (×5): qty 100

## 2018-02-20 MED ORDER — HYDROCHLOROTHIAZIDE 25 MG PO TABS: 25.0000 mg | ORAL_TABLET | Freq: Every day | ORAL | Status: DC

## 2018-02-20 MED ORDER — METOPROLOL TARTRATE 50 MG PO TABS
50.0000 mg | ORAL_TABLET | Freq: Two times a day (BID) | ORAL | Status: DC
  Administered 2018-02-21 – 2018-02-23 (×3): 50 mg via ORAL
  Filled 2018-02-20 (×5): qty 1

## 2018-02-20 MED ORDER — SODIUM CHLORIDE 0.9% FLUSH
3.0000 mL | Freq: Two times a day (BID) | INTRAVENOUS | Status: DC
  Administered 2018-02-21 – 2018-02-23 (×5): 3 mL via INTRAVENOUS

## 2018-02-20 MED ORDER — ONDANSETRON HCL 4 MG PO TABS: 4.0000 mg | ORAL_TABLET | Freq: Four times a day (QID) | ORAL | Status: DC | PRN

## 2018-02-20 MED ORDER — MORPHINE SULFATE (PF) 2 MG/ML IV SOLN
1.0000 mg | INTRAVENOUS | Status: DC | PRN
  Administered 2018-02-20 – 2018-02-21 (×2): 1 mg via INTRAVENOUS
  Filled 2018-02-20 (×2): qty 1

## 2018-02-20 MED ORDER — HYDRALAZINE HCL 20 MG/ML IJ SOLN: 10.0000 mg | Freq: Four times a day (QID) | INTRAMUSCULAR | Status: DC | PRN

## 2018-02-20 MED ORDER — LOSARTAN POTASSIUM 50 MG PO TABS
100.0000 mg | ORAL_TABLET | Freq: Every day | ORAL | Status: DC
  Administered 2018-02-21 – 2018-02-23 (×2): 100 mg via ORAL
  Filled 2018-02-20 (×3): qty 2

## 2018-02-20 MED ORDER — APIXABAN 5 MG PO TABS
5.0000 mg | ORAL_TABLET | Freq: Two times a day (BID) | ORAL | Status: DC
  Administered 2018-02-21 – 2018-02-23 (×5): 5 mg via ORAL
  Filled 2018-02-20 (×5): qty 1

## 2018-02-20 MED ORDER — BISACODYL 10 MG RE SUPP
10.0000 mg | Freq: Every day | RECTAL | Status: DC | PRN
  Filled 2018-02-20: qty 1

## 2018-02-20 NOTE — ED Notes (Addendum)
Patient transported to MRI. Potassium stopped for MRI.

## 2018-02-20 NOTE — H&P (Signed)
[employer] - [place] [facility]   PATIENT NAME: Oleva Koo    MR#:  [id number]  DATE OF BIRTH:  1941/05/05  DATE OF ADMISSION:  02/20/2018  PRIMARY CARE PHYSICIAN: [name], MD   REQUESTING/REFERRING PHYSICIAN: Dr. Pershing Proud  CHIEF COMPLAINT:   Chief Complaint  Patient presents with  . Altered Mental Status    HISTORY OF PRESENT ILLNESS:  Darci Lykins  is a 77 y.o. female with a known history of CVA, atrial fibrillation on Eliquis, hypertension presents to the emergency room due to altered mental status.  Patient was thought to have seizures.  Here in the emergency room she was found to have jerking of right upper extremity.  Seen by neurology Dr. Thad Ranger.  Started on Vimpat.  Patient CT scan shows old strokes but nothing acute.  Patient is being admitted for management of seizures, MRI. No signs of infection.  PAST MEDICAL HISTORY:   Past Medical History:  Diagnosis Date  . Borderline hyperlipidemia   . Difficulty sleeping    DUE TO PAIN  . GERD (gastroesophageal reflux disease)   . Hypertension   . Neuromuscular disorder (HCC)   . Osteoarthritis   . PAF (paroxysmal atrial fibrillation) (HCC)   . Stroke [facility]) 07/2016    PAST SURGICAL HISTORY:   Past Surgical History:  Procedure Laterality Date  . IR GENERIC HISTORICAL  07/28/2016   IR PERCUTANEOUS ART THROMBECTOMY/INFUSION INTRACRANIAL INC DIAG ANGIO 07/28/2016 Julieanne Cotton, MD [facility]-INTERV RAD  . LYMPH NODE BIOPSY     BENIGN  . RADIOLOGY WITH ANESTHESIA N/A 07/28/2016   Procedure: RADIOLOGY WITH ANESTHESIA;  Surgeon: Julieanne Cotton, MD;  Location: [facility] OR;  Service: Radiology;  Laterality: N/A;  . TOTAL HIP ARTHROPLASTY Right 08/22/2015   Procedure: RIGHT TOTAL HIP ARTHROPLASTY ANTERIOR APPROACH;  Surgeon: Kathryne Hitch, MD;  Location: [facility] ORS;  Service: Orthopedics;  Laterality: Right;    SOCIAL HISTORY:   Social History   Tobacco Use  . Smoking status: Never Smoker  .  Smokeless tobacco: Never Used  Substance Use Topics  . Alcohol use: No    FAMILY HISTORY:   Family History  Problem Relation Age of Onset  . Cancer Mother   . Alzheimer's disease Mother   . Heart disease Neg Hx   . Diabetes Neg Hx   . Stroke Neg Hx     DRUG ALLERGIES:  No Known Allergies  REVIEW OF SYSTEMS:   Review of Systems  Unable to perform ROS: Mental status change    MEDICATIONS AT HOME:   Prior to Admission medications   Medication Sig Start Date End Date Taking? Authorizing Provider  apixaban (ELIQUIS) 5 MG TABS tablet Take 5 mg by mouth 2 (two) times daily.   Yes [provider]  atorvastatin (LIPITOR) 20 MG tablet Take 1 tablet (20 mg total) by mouth daily at 6 PM. 08/04/16  Yes Marvel Plan, MD  baclofen (LIORESAL) 10 MG tablet Take 10 mg by mouth 3 (three) times daily. 09/08/16 as needed   Yes [provider]  gabapentin (NEURONTIN) 300 MG capsule Take 300 mg by mouth 3 (three) times daily. 10/12/16  Yes [provider]  losartan-hydrochlorothiazide (HYZAAR) 100-25 MG tablet Take 1 tablet by mouth daily.  09/07/16  Yes [provider]  metoprolol (LOPRESSOR) 50 MG tablet Take 50 mg by mouth 2 (two) times daily.    Yes [provider]  amLODipine (NORVASC) 2.5 MG tablet Take 1 tablet (2.5 mg total) by mouth daily.  Patient not taking: Reported on 02/20/2018 11/02/16 01/31/17  Rollene Rotunda, MD  HYDROcodone-acetaminophen (NORCO/VICODIN) 5-325 MG tablet Take 2 tablets by mouth every 6 (six) hours as needed. Patient not taking: Reported on 02/20/2018 10/20/16   Alvira Monday, MD  lidocaine (LIDODERM) 5 % Place 1 patch onto the skin daily as needed (pain). Remove & Discard patch within 12 hours or as directed by MD     [provider]  sertraline (ZOLOFT) 50 MG tablet Take 1 tablet (50 mg total) by mouth at bedtime. Patient not taking: Reported on 02/20/2018 08/04/16   Marvel Plan, MD     VITAL SIGNS:  Blood  pressure (!) 141/72, pulse 60, temperature (!) 97.2 F (36.2 C), temperature source Axillary, resp. rate (!) 35, SpO2 100 %.  PHYSICAL EXAMINATION:  Physical Exam  GENERAL:  77 y.o.-year-old patient lying in the bed , drowsy EYES: Pupils equal, round, reactive to light and accommodation. No scleral icterus. Extraocular muscles intact.  HEENT: Head atraumatic, normocephalic. Oropharynx and nasopharynx clear. No oropharyngeal erythema, moist oral mucosa  NECK:  Supple, no jugular venous distention. No thyroid enlargement, no tenderness.  LUNGS: Normal breath sounds bilaterally, no wheezing, rales, rhonchi. No use of accessory muscles of respiration.  CARDIOVASCULAR: S1, S2 normal. No murmurs, rubs, or gallops.  ABDOMEN: Soft, nontender, nondistended. Bowel sounds present. No organomegaly or mass.  EXTREMITIES: No pedal edema, cyanosis, or clubbing. + 2 pedal & radial pulses b/l.   NEUROLOGIC: Not following instructions. PSYCHIATRIC: The patient is drowsy SKIN: No obvious rash, lesion, or ulcer.   LABORATORY PANEL:   CBC Recent Labs  Lab 02/20/18 0926  WBC 10.6  HGB 11.6*  HCT 35.7  PLT 196   ------------------------------------------------------------------------------------------------------------------  Chemistries  Recent Labs  Lab 02/20/18 0926  NA 140  K 2.9*  CL 101  CO2 28  GLUCOSE 135*  BUN 32*  CREATININE 1.11*  CALCIUM 9.4  AST 28  ALT 15  ALKPHOS 104  BILITOT 0.5   ------------------------------------------------------------------------------------------------------------------  Cardiac Enzymes Recent Labs  Lab 02/20/18 0926  TROPONINI <0.03   ------------------------------------------------------------------------------------------------------------------  RADIOLOGY:  Dg Chest 1 View  Result Date: 02/20/2018 CLINICAL DATA:  Altered mental status and vomiting EXAM: CHEST  1 VIEW COMPARISON:  None. FINDINGS: No edema or consolidation. There is mild  cardiac enlargement. The pulmonary vascularity is normal. No adenopathy. There is aortic atherosclerosis. No bone lesions. IMPRESSION: Mild cardiac enlargement. No edema or consolidation. There is aortic atherosclerosis. Aortic Atherosclerosis (ICD10-I70.0). Electronically Signed   By: Bretta Bang III M.D.   On: 02/20/2018 09:43   Ct Head Wo Contrast  Result Date: 02/20/2018 CLINICAL DATA:  Altered mental status and vomiting EXAM: CT HEAD WITHOUT CONTRAST TECHNIQUE: Contiguous axial images were obtained from the base of the skull through the vertex without intravenous contrast. COMPARISON:  Head CT July 22, 2017 and brain MRI July 22, 2017 FINDINGS: Brain: Mild to moderate diffuse atrophy is stable. There is evidence of a prior infarct involving portions of the right mid to posterior frontal and anterior parietal lobes as well as a portion of the superior right temporal lobe consistent with a sizable right middle cerebral artery distribution infarct, stable. Elsewhere there is patchy small vessel disease in the centra semiovale bilaterally. There is also small vessel disease in the right internal capsule. There is decreased attenuation in the mid right thalamus consistent with a small prior lacunar infarct. There is no intracranial mass, hemorrhage, extra-axial fluid collection, or midline shift. No acute infarct is demonstrable  on this study. Vascular: There is no appreciable hyperdense vessel. There is calcification in each carotid siphon. Skull: Bony calvarium appears intact. Sinuses/Orbits: There is mild mucosal thickening in several ethmoid air cells. Other visualized paranasal sinuses are clear. Visualized orbits appear symmetric bilaterally. Other: There is opacification with air-fluid level in the inferior left mastoid air cell region. Mastoid air cells elsewhere appear normal. IMPRESSION: 1. Prior infarct involving portions of the right mid to posterior frontal, anterior parietal, and  superior right temporal lobes, stable. There is stable atrophy with mild patchy periventricular small vessel disease. Small vessel disease also noted in the right internal capsule and right thalamus. No acute infarct demonstrable. No mass or hemorrhage. 2.  Foci of arterial vascular calcification noted. 3.  Areas of ethmoid sinus mucosal thickening. 4. Inferior left mastoid air cell disease with air-fluid level in the inferior left mastoid region. Electronically Signed   By: Bretta BangWilliam  Woodruff III M.D.   On: 02/20/2018 10:01     IMPRESSION AND PLAN:   *Seizures.  Etiology unclear.  She does have history of strokes which could be the cause.  Nothing acute on CT head.  Check MRI of the brain. If there is new CVA on the MRI will need stroke workup. Seizure precautions. Start Vimpat  *Paroxysmal atrial fibrillation on Eliquis.  Patient is n.p.o. at this time.  Restart Eliquis when able to take oral  *Hypertension.  Hold oral medications.  IV medications added  *Acute encephalopathy due to postictal state from seizures.  But also rule out other etiologies with MRI brain.  *Hypokalemia.  Replace through IV  All the records are reviewed and case discussed with ED provider. Management plans discussed with the patient, family and they are in agreement.  CODE STATUS: Full code  TOTAL TIME TAKING CARE OF THIS PATIENT: 40 minutes.   Orie FishermanSrikar R Orla Jolliff M.D on 02/20/2018 at 11:37 AM  Between 7am to 6pm - Pager - [phone]  After 6pm go to [email or web address] - password [email or web address] [facility]  [facility]  Office  [phone]  CC: Primary care physician; Fleet ContrasAvbuere, Edwin, MD  Note: This dictation was prepared with Dragon dictation along with smaller phrase technology. Any transcriptional errors that result from this process are unintentional.

## 2018-02-20 NOTE — Consult Note (Signed)
Referring Physician: Schaevitz    Chief Complaint: Altered mental status  HPI: Diana Bush is an 77 y.o. female with a history of stroke on Eliquis who went to bed at baseline last evening.  When her daughter went to check on her this morning she was leaning to the left and had vomited.  Was not responding as usual.  EMS was called at that time. Initial NIHSS of 22.  Date last known well: Date: 02/19/2018 Time last known well: Time: 21:00 tPA Given: No: On Elquis, outside time window  Modified Rankin: Rankin Score=5  Past Medical History:  Diagnosis Date  . Borderline hyperlipidemia   . Difficulty sleeping    DUE TO PAIN  . GERD (gastroesophageal reflux disease)   . Hypertension   . Neuromuscular disorder (HCC)   . Osteoarthritis   . PAF (paroxysmal atrial fibrillation) (HCC)   . Stroke [facility] 07/2016    Past Surgical History:  Procedure Laterality Date  . IR GENERIC HISTORICAL  07/28/2016   IR PERCUTANEOUS ART THROMBECTOMY/INFUSION INTRACRANIAL INC DIAG ANGIO 07/28/2016 Julieanne Cotton, MD [facility]-INTERV RAD  . LYMPH NODE BIOPSY     BENIGN  . RADIOLOGY WITH ANESTHESIA N/A 07/28/2016   Procedure: RADIOLOGY WITH ANESTHESIA;  Surgeon: Julieanne Cotton, MD;  Location: [facility] OR;  Service: Radiology;  Laterality: N/A;  . TOTAL HIP ARTHROPLASTY Right 08/22/2015   Procedure: RIGHT TOTAL HIP ARTHROPLASTY ANTERIOR APPROACH;  Surgeon: Kathryne Hitch, MD;  Location: [facility] ORS;  Service: Orthopedics;  Laterality: Right;    Family History  Problem Relation Age of Onset  . Cancer Mother   . Alzheimer's disease Mother   . Heart disease Neg Hx   . Diabetes Neg Hx   . Stroke Neg Hx    Social History:  reports that she has never smoked. She has never used smokeless tobacco. She reports that she does not drink alcohol or use drugs.  Allergies: No Known Allergies  Medications: I have reviewed the patient's current medications. Prior to Admission:  Prior to Admission medications    Medication Sig Start Date End Date Taking? Authorizing Provider  apixaban (ELIQUIS) 5 MG TABS tablet Take 5 mg by mouth 2 (two) times daily.   Yes [provider]  atorvastatin (LIPITOR) 20 MG tablet Take 1 tablet (20 mg total) by mouth daily at 6 PM. 08/04/16  Yes Marvel Plan, MD  baclofen (LIORESAL) 10 MG tablet Take 10 mg by mouth 3 (three) times daily. 09/08/16 as needed   Yes [provider]  gabapentin (NEURONTIN) 300 MG capsule Take 300 mg by mouth 3 (three) times daily. 10/12/16  Yes [provider]  losartan-hydrochlorothiazide (HYZAAR) 100-25 MG tablet Take 1 tablet by mouth daily.  09/07/16  Yes [provider]  metoprolol (LOPRESSOR) 50 MG tablet Take 50 mg by mouth 2 (two) times daily.    Yes [provider]  amLODipine (NORVASC) 2.5 MG tablet Take 1 tablet (2.5 mg total) by mouth daily. Patient not taking: Reported on 02/20/2018 11/02/16 01/31/17  Rollene Rotunda, MD  HYDROcodone-acetaminophen (NORCO/VICODIN) 5-325 MG tablet Take 2 tablets by mouth every 6 (six) hours as needed. Patient not taking: Reported on 02/20/2018 10/20/16   Alvira Monday, MD  lidocaine (LIDODERM) 5 % Place 1 patch onto the skin daily as needed (pain). Remove & Discard patch within 12 hours or as directed by MD     [provider]  sertraline (ZOLOFT) 50 MG tablet Take 1 tablet (50 mg total) by mouth at bedtime. Patient not  taking: Reported on 02/20/2018 08/04/16   Marvel Plan, MD    ROS: Unable to provide due to mental status  Physical Examination: Blood pressure (!) 141/72, pulse 60, temperature (!) 97.2 F (36.2 C), temperature source Axillary, resp. rate (!) 35, SpO2 100 %.  HEENT-  Normocephalic, no lesions, without obvious abnormality.  Normal external eye and conjunctiva.  Normal TM's bilaterally.  Normal auditory canals and external ears. Normal external nose, mucus membranes and septum.  Normal pharynx. Cardiovascular- S1, S2 normal, pulses  palpable throughout   Lungs- chest clear, no wheezing, rales, normal symmetric air entry Abdomen- soft, non-tender; bowel sounds normal; no masses,  no organomegaly Extremities- mild LE edema Lymph-no adenopathy palpable Musculoskeletal-no joint tenderness, deformity or swelling Skin-warm and dry, no hyperpigmentation, vitiligo, or suspicious lesions  Neurological Examination   Mental Status: Patient lethargic.  Has some one word responses to questioning.  Follows some simple commands.    Cranial Nerves: II: patient does not respond confrontation from the left, pupils right 3 mm, left 4 mm,and reactive bilaterally III,IV,VI: doll's response absent bilaterally.  V,VII: corneal reflex reduced on the left  VIII: patient does not respond to verbal stimuli IX,X: gag reflex reduced, XI: trapezius strength unable to test bilaterally XII: tongue strength unable to test Motor: Moves RUE spontaneously against gravity.  Left upper extremity with increased tone and fisted hand.  Increased tone in both lower extremities.  Some toe movement noted on the right. Clonic activity noted in the RUE.  Sensory: Does not respond to noxious stimuli in any extremity. Deep Tendon Reflexes:  1+ in the upper extremities and absent in the lower extremities Plantars: upgoing on the right Cerebellar: Unable to perform   Laboratory Studies:  Basic Metabolic Panel: Recent Labs  Lab 02/20/18 0926  NA 140  K 2.9*  CL 101  CO2 28  GLUCOSE 135*  BUN 32*  CREATININE 1.11*  CALCIUM 9.4    Liver Function Tests: Recent Labs  Lab 02/20/18 0926  AST 28  ALT 15  ALKPHOS 104  BILITOT 0.5  PROT 8.6*  ALBUMIN 4.3   Recent Labs  Lab 02/20/18 0926  LIPASE 36   No results for input(s): AMMONIA in the last 168 hours.  CBC: Recent Labs  Lab 02/20/18 0926  WBC 10.6  NEUTROABS 7.8*  HGB 11.6*  HCT 35.7  MCV 85.8  PLT 196    Cardiac Enzymes: Recent Labs  Lab 02/20/18 0926  TROPONINI <0.03     BNP: Invalid input(s): POCBNP  CBG: Recent Labs  Lab 02/20/18 0921  GLUCAP 105*    Microbiology: Results for orders placed or performed during the hospital encounter of 07/28/16  MRSA PCR Screening     Status: None   Collection Time: 07/29/16  6:06 AM  Result Value Ref Range Status   MRSA by PCR NEGATIVE NEGATIVE Final    Comment:        The GeneXpert MRSA Assay (FDA approved for NASAL specimens only), is one component of a comprehensive MRSA colonization surveillance program. It is not intended to diagnose MRSA infection nor to guide or monitor treatment for MRSA infections.     Coagulation Studies: Recent Labs    02/20/18 0926  LABPROT 14.7  INR 1.16    Urinalysis:  Recent Labs  Lab 02/20/18 1008  COLORURINE YELLOW*  LABSPEC 1.018  PHURINE 5.0  GLUCOSEU NEGATIVE  HGBUR NEGATIVE  BILIRUBINUR NEGATIVE  KETONESUR NEGATIVE  PROTEINUR NEGATIVE  NITRITE NEGATIVE  LEUKOCYTESUR NEGATIVE  Lipid Panel:    Component Value Date/Time   CHOL 143 07/29/2016 0500   TRIG 69 07/31/2016 0149   HDL 43 07/29/2016 0500   CHOLHDL 3.3 07/29/2016 0500   VLDL 10 07/29/2016 0500   LDLCALC 90 07/29/2016 0500    HgbA1C:  Lab Results  Component Value Date   HGBA1C 5.6 07/29/2016    Urine Drug Screen:      Component Value Date/Time   LABOPIA NONE DETECTED 07/28/2016 2156   COCAINSCRNUR NONE DETECTED 07/28/2016 2156   LABBENZ NONE DETECTED 07/28/2016 2156   AMPHETMU NONE DETECTED 07/28/2016 2156   THCU NONE DETECTED 07/28/2016 2156   LABBARB NONE DETECTED 07/28/2016 2156    Alcohol Level:  Recent Labs  Lab 02/20/18 0926  ETH <10    Other results: EKG: sinus rhythm at 67 bpm.  Imaging: Dg Chest 1 View  Result Date: 02/20/2018 CLINICAL DATA:  Altered mental status and vomiting EXAM: CHEST  1 VIEW COMPARISON:  None. FINDINGS: No edema or consolidation. There is mild cardiac enlargement. The pulmonary vascularity is normal. No adenopathy. There is  aortic atherosclerosis. No bone lesions. IMPRESSION: Mild cardiac enlargement. No edema or consolidation. There is aortic atherosclerosis. Aortic Atherosclerosis (ICD10-I70.0). Electronically Signed   By: Bretta Bang III M.D.   On: 02/20/2018 09:43   Ct Head Wo Contrast  Result Date: 02/20/2018 CLINICAL DATA:  Altered mental status and vomiting EXAM: CT HEAD WITHOUT CONTRAST TECHNIQUE: Contiguous axial images were obtained from the base of the skull through the vertex without intravenous contrast. COMPARISON:  Head CT July 22, 2017 and brain MRI July 22, 2017 FINDINGS: Brain: Mild to moderate diffuse atrophy is stable. There is evidence of a prior infarct involving portions of the right mid to posterior frontal and anterior parietal lobes as well as a portion of the superior right temporal lobe consistent with a sizable right middle cerebral artery distribution infarct, stable. Elsewhere there is patchy small vessel disease in the centra semiovale bilaterally. There is also small vessel disease in the right internal capsule. There is decreased attenuation in the mid right thalamus consistent with a small prior lacunar infarct. There is no intracranial mass, hemorrhage, extra-axial fluid collection, or midline shift. No acute infarct is demonstrable on this study. Vascular: There is no appreciable hyperdense vessel. There is calcification in each carotid siphon. Skull: Bony calvarium appears intact. Sinuses/Orbits: There is mild mucosal thickening in several ethmoid air cells. Other visualized paranasal sinuses are clear. Visualized orbits appear symmetric bilaterally. Other: There is opacification with air-fluid level in the inferior left mastoid air cell region. Mastoid air cells elsewhere appear normal. IMPRESSION: 1. Prior infarct involving portions of the right mid to posterior frontal, anterior parietal, and superior right temporal lobes, stable. There is stable atrophy with mild patchy  periventricular small vessel disease. Small vessel disease also noted in the right internal capsule and right thalamus. No acute infarct demonstrable. No mass or hemorrhage. 2.  Foci of arterial vascular calcification noted. 3.  Areas of ethmoid sinus mucosal thickening. 4. Inferior left mastoid air cell disease with air-fluid level in the inferior left mastoid region. Electronically Signed   By: Bretta Bang III M.D.   On: 02/20/2018 10:01    Assessment: 77 y.o. female presenting with altered mental status and noted to have RUE clonic activity on neurological examination.  Patient appears to be having seizure activity and suspect this is the etiology of the patient's altered mental status.  Patient at risk due to past infarcts.  Head CT reviewed and shows prior infarcts but no acute changes.    Stroke Risk Factors - atrial fibrillation, hyperlipidemia and hypertension  Plan: 1. MRI of the brain without contrast to rule out recurrent stroke.  Would not initiate a stroke work up unless findings consistent with an acute ischemic event.   2. Continue Eliquis 3. NPO until RN stroke swallow screen 4. Vimpat 200mg  IV now with maintenance of 50mg  q 12 hours 5. Frequent neuro checks 6. Seizure precautions   Case discussed with Dr. Talbert CageSchaevitz  Coda Filler, MD Neurology [phone] 02/20/2018, 10:46 AM

## 2018-02-20 NOTE — ED Provider Notes (Signed)
[facility] Emergency Department Provider Note  ____________________________________________   First MD Initiated Contact with Patient 02/20/18 [phone]     (approximate)  I have reviewed the triage vital signs and the nursing notes.   HISTORY  Chief Complaint Altered Mental Status   HPI Diana Bush is a 77 y.o. female with a history of atrial fibrillation on Eliquis as well as stroke who is presenting to the emergency department altered mental status.  She was found this morning with severely decreased mentation.  Not talking or following commands.  Last seen normal at 9:51 PM last night.  Patient unable to state any pain present or any other complaints.   Past Medical History:  Diagnosis Date  . Borderline hyperlipidemia   . Difficulty sleeping    DUE TO PAIN  . GERD (gastroesophageal reflux disease)   . Hypertension   . Neuromuscular disorder (HCC)   . Osteoarthritis   . PAF (paroxysmal atrial fibrillation) (HCC)   . Stroke [facility]) 07/2016    Patient Active Problem List   Diagnosis Date Noted  . Persistent atrial fibrillation (HCC) 05/12/2017  . Hypertensive emergency 08/04/2016  . Hypokalemia 08/04/2016  . Depression   . Cerebrovascular accident (CVA) due to thrombosis of right middle cerebral artery (HCC) s/p mechanical thrombectomy   . Hemiplegia affecting left nondominant side (HCC)   . HLD (hyperlipidemia)   . Dysphagia, post-stroke   . Dysarthria, post-stroke   . Expressive aphasia   . Acute blood loss anemia   . Benign essential HTN   . Atrial fibrillation with RVR (HCC) 07/29/2016  . Osteoarthritis of right hip 08/22/2015  . 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 IR GENERIC HISTORICAL  07/28/2016   IR PERCUTANEOUS ART THROMBECTOMY/INFUSION INTRACRANIAL INC DIAG ANGIO 07/28/2016 Julieanne Cotton, MD [facility]-INTERV RAD  . LYMPH NODE BIOPSY     BENIGN  . RADIOLOGY  WITH ANESTHESIA N/A 07/28/2016   Procedure: RADIOLOGY WITH ANESTHESIA;  Surgeon: Julieanne Cotton, MD;  Location: [facility] OR;  Service: Radiology;  Laterality: N/A;  . TOTAL HIP ARTHROPLASTY Right 08/22/2015   Procedure: RIGHT TOTAL HIP ARTHROPLASTY ANTERIOR APPROACH;  Surgeon: Kathryne Hitch, MD;  Location: [facility] ORS;  Service: Orthopedics;  Laterality: Right;    Prior to Admission medications   Medication Sig Start Date End Date Taking? Authorizing Provider  apixaban (ELIQUIS) 5 MG TABS tablet Take 5 mg by mouth 2 (two) times daily.   Yes [provider]  atorvastatin (LIPITOR) 20 MG tablet Take 1 tablet (20 mg total) by mouth daily at 6 PM. 08/04/16  Yes Marvel Plan, MD  baclofen (LIORESAL) 10 MG tablet Take 10 mg by mouth 3 (three) times daily. 09/08/16 as needed   Yes [provider]  gabapentin (NEURONTIN) 300 MG capsule Take 300 mg by mouth 3 (three) times daily. 10/12/16  Yes [provider]  losartan-hydrochlorothiazide (HYZAAR) 100-25 MG tablet Take 1 tablet by mouth daily.  09/07/16  Yes [provider]  metoprolol (LOPRESSOR) 50 MG tablet Take 50 mg by mouth 2 (two) times daily.    Yes [provider]  amLODipine (NORVASC) 2.5 MG tablet Take 1 tablet (2.5 mg total) by mouth daily. Patient not taking: Reported on 02/20/2018 11/02/16 01/31/17  Rollene Rotunda, MD  HYDROcodone-acetaminophen (NORCO/VICODIN) 5-325 MG tablet Take 2 tablets by mouth every 6 (six) hours as needed. Patient not taking: Reported on 02/20/2018 10/20/16   Alvira Monday, MD  lidocaine (LIDODERM) 5 % Place 1 patch onto the skin daily as needed (pain). Remove & Discard patch within 12 hours or as directed by MD     [provider]  sertraline (ZOLOFT) 50 MG tablet Take 1 tablet (50 mg total) by mouth at bedtime. Patient not taking: Reported on 02/20/2018 08/04/16   Marvel PlanXu, Jindong, MD    Allergies Patient has no known allergies.  Family History  Problem Relation Age  of Onset  . Cancer Mother   . Alzheimer's disease Mother   . Heart disease Neg Hx   . Diabetes Neg Hx   . Stroke Neg Hx     Social History Social History   Tobacco Use  . Smoking status: Never Smoker  . Smokeless tobacco: Never Used  Substance Use Topics  . Alcohol use: No  . Drug use: No    Review of Systems  Level 5 caveat secondary to altered mental status   ____________________________________________   PHYSICAL EXAM:  VITAL SIGNS: ED Triage Vitals  Enc Vitals Group     BP 02/20/18 0921 129/60     Pulse Rate 02/20/18 0921 61     Resp 02/20/18 0930 (!) 35     Temp 02/20/18 0921 (!) 97.2 F (36.2 C)     Temp Source 02/20/18 0921 Axillary     SpO2 02/20/18 0921 100 %     Weight --      Height --      Head Circumference --      Peak Flow --      Pain Score --      Pain Loc --      Pain Edu? --      Excl. in GC? --     Constitutional: Alert with eyes open but nonverbal.  Not following commands. in no acute distress. Eyes: Conjunctivae are normal.  Perl Head: Atraumatic. Nose: No congestion/rhinnorhea. Mouth/Throat: Mucous membranes are moist.  Neck: No stridor.   Cardiovascular: Normal rate, regular rhythm. Grossly normal heart sounds.   Respiratory: Normal respiratory effort.  No retractions. Lungs CTAB. Gastrointestinal: Soft and nontender. No distention. No CVA tenderness. Musculoskeletal: No lower extremity tenderness nor edema.  No joint effusions. Neurologic: Appears to be moving all 4 extremities equally.  Good tone to all 4 extremities. Skin:  Skin is warm, dry and intact. No rash noted.   ____________________________________________   LABS (all labs ordered are listed, but only abnormal results are displayed)  Labs Reviewed  GLUCOSE, CAPILLARY - Abnormal; Notable for the following components:      Result Value   Glucose-Capillary 105 (*)    All other components within normal limits  CBC WITH DIFFERENTIAL/PLATELET - Abnormal; Notable for  the following components:   Hemoglobin 11.6 (*)    RDW 14.7 (*)    Neutro Abs 7.8 (*)    All other components within normal limits  COMPREHENSIVE METABOLIC PANEL - Abnormal; Notable for the following components:   Potassium 2.9 (*)    Glucose, Bld 135 (*)    BUN 32 (*)    Creatinine, Ser 1.11 (*)    Total Protein 8.6 (*)    GFR calc non Af Amer 47 (*)    GFR calc Af Amer 54 (*)    All other components within normal limits  TROPONIN I  LIPASE, BLOOD  PROTIME-INR  APTT  URINALYSIS, COMPLETE (UACMP) WITH MICROSCOPIC  URINE DRUG SCREEN, QUALITATIVE ([facility] ONLY)  ETHANOL   ____________________________________________  EKG  ED ECG REPORT  Macie Burows, the attending physician, personally viewed and interpreted this ECG.   Date: 02/20/2018  EKG Time: 0927  Rate: 61  Rhythm: normal EKG, normal sinus rhythm,   Axis: normal  Intervals:prolonged qt  ST&T Change: No ST segment elevation or depression.  T wave inversions in 2, 3, aVF, V3 through 6. T wave inversions appear new from previous. ____________________________________________  RADIOLOGY  No acute finding on the CT of the brain. ____________________________________________   PROCEDURES  Procedure(s) performed:   Procedures  Critical Care performed:   ____________________________________________   INITIAL IMPRESSION / ASSESSMENT AND PLAN / ED COURSE  Pertinent labs & imaging results that were available during my care of the patient were reviewed by me and considered in my medical decision making (see chart for details).  Differential diagnosis includes, but is not limited to, alcohol, illicit or prescription medications, or other toxic ingestion; intracranial pathology such as stroke or intracerebral hemorrhage; fever or infectious causes including sepsis; hypoxemia and/or hypercarbia; uremia; trauma; endocrine related disorders such as diabetes, hypoglycemia, and thyroid-related diseases; hypertensive  encephalopathy; etc. As part of my medical decision making, I reviewed the following data within the electronic MEDICAL RECORD NUMBER Notes from prior ED visits  ----------------------------------------- 11:05 AM on 02/20/2018 -----------------------------------------  Patient was made a code stroke because of a aphasia.  While Dr. Thad Ranger was evaluating her she noticed clonic, focal extremity motion.  Likely seizure with postictal state.  Dr. Thad Ranger to write for seizure medication.  Patient will be admitted to the hospital.  Signed out to Dr. Elpidio Anis. ____________________________________________   FINAL CLINICAL IMPRESSION(S) / ED DIAGNOSES  Seizure.  Altered mental status.    NEW MEDICATIONS STARTED DURING THIS VISIT:  New Prescriptions   No medications on file     Note:  This document was prepared using Dragon voice recognition software and may include unintentional dictation errors.     Myrna Blazer, MD 02/20/18 1106

## 2018-02-20 NOTE — ED Notes (Addendum)
MRI brought pt back to room because pt was vomiting. Was unable to complete scan.  PT given IV zofran at this time

## 2018-02-20 NOTE — ED Notes (Signed)
Pt brief changed at this time of urine. Pt clean and dry

## 2018-02-20 NOTE — ED Triage Notes (Signed)
Arrives with daughter, who reports patient was found this morning vomiting and not responding per normal.  Daughter reports that patient did not respond verbally, only yelling when family trid to move patient.  Last known well was last night at around 2200.

## 2018-02-20 NOTE — Code Documentation (Signed)
Pt arrives POV with family, per family pt was at baseline 4/14 2200 at bedtime, this AM pt was found with AMS, not speaking and vomiting, pt assisted from car,brought to room 6 in ED and Code Stroke Activated, No tPA due to LKW > 4.5 hrs and pt on eliquis with hx of CVA left side residual, pt not ambulatory at baseline, Tonic movement of right arm witnessed by Dr. Thad Rangereynolds, NIHSS 22, IV seizure medication ordered by Dr. Thad Rangereynolds, [facility]

## 2018-02-20 NOTE — ED Notes (Signed)
First Nurse Note:  Patient assisted from car to stretcher, taken to Room 6.  Possible stroke.  Charge Nurse aware.

## 2018-02-20 NOTE — Progress Notes (Signed)
While rounding Chaplain saw neurologist and nurse together and inquired if there was a stroke. Chaplain asked the family if there were any needs. Family said prayer is always needed. Chaplain prayed for peace and mothers health. Chaplain prayed and let them know services are always available.    02/20/18 1000  Clinical Encounter Type  Visited With Patient and family together  Visit Type Initial  Referral From Physician  Spiritual Encounters  Spiritual Needs Prayer

## 2018-02-21 ENCOUNTER — Inpatient Hospital Stay: Payer: Medicare Other

## 2018-02-21 DIAGNOSIS — R569 Unspecified convulsions: Secondary | ICD-10-CM | POA: Diagnosis not present

## 2018-02-21 LAB — BASIC METABOLIC PANEL
ANION GAP: 8 (ref 5–15)
BUN: 25 mg/dL — ABNORMAL HIGH (ref 6–20)
CO2: 26 mmol/L (ref 22–32)
Calcium: 9.2 mg/dL (ref 8.9–10.3)
Chloride: 106 mmol/L (ref 101–111)
Creatinine, Ser: 0.89 mg/dL (ref 0.44–1.00)
GLUCOSE: 107 mg/dL — AB (ref 65–99)
Potassium: 3.5 mmol/L (ref 3.5–5.1)
SODIUM: 140 mmol/L (ref 135–145)

## 2018-02-21 LAB — CBC
HCT: 30.6 % — ABNORMAL LOW (ref 35.0–47.0)
HEMOGLOBIN: 10.1 g/dL — AB (ref 12.0–16.0)
MCH: 27.9 pg (ref 26.0–34.0)
MCHC: 33.1 g/dL (ref 32.0–36.0)
MCV: 84.3 fL (ref 80.0–100.0)
Platelets: 187 10*3/uL (ref 150–440)
RBC: 3.63 MIL/uL — AB (ref 3.80–5.20)
RDW: 14.7 % — ABNORMAL HIGH (ref 11.5–14.5)
WBC: 6.8 10*3/uL (ref 3.6–11.0)

## 2018-02-21 MED ORDER — MORPHINE SULFATE (PF) 2 MG/ML IV SOLN
2.0000 mg | INTRAVENOUS | Status: DC | PRN
  Administered 2018-02-21: 2 mg via INTRAVENOUS
  Filled 2018-02-21: qty 1

## 2018-02-21 MED ORDER — POTASSIUM CHLORIDE CRYS ER 20 MEQ PO TBCR
20.0000 meq | EXTENDED_RELEASE_TABLET | Freq: Once | ORAL | Status: AC
  Administered 2018-02-21: 13:00:00 20 meq via ORAL
  Filled 2018-02-21: qty 1

## 2018-02-21 NOTE — Progress Notes (Signed)
[facility] - [place] at [facility]   PATIENT NAME: Diana Bush    MR#:  [id number]  DATE OF BIRTH:  01/25/1941  SUBJECTIVE: Admitted for seizures, started on Vimpat.  No seizures since admission, patient is alert has previous stroke with leg weakness due to previous stroke, slow to respond with speech difficulties as per patient's daughter.  CHIEF COMPLAINT:   Chief Complaint  Patient presents with  . Altered Mental Status    REVIEW OF SYSTEMS:    Review of Systems  Constitutional: Negative for chills and fever.  HENT: Negative for hearing loss.   Eyes: Negative for blurred vision, double vision and photophobia.  Respiratory: Negative for cough, hemoptysis and shortness of breath.   Cardiovascular: Negative for palpitations, orthopnea and leg swelling.  Gastrointestinal: Negative for abdominal pain, diarrhea and vomiting.  Genitourinary: Negative for dysuria and urgency.  Musculoskeletal: Negative for myalgias and neck pain.  Skin: Negative for rash.  Neurological: Positive for speech change, focal weakness and weakness. Negative for dizziness, seizures and headaches.  Psychiatric/Behavioral: Positive for memory loss. The patient does not have insomnia.   Patient not following spoken commands and according to patient's daughter she is at her baseline now.  Nutrition:  Tolerating Diet: Tolerating PT:      DRUG ALLERGIES:  No Known Allergies  VITALS:  Blood pressure (!) 116/59, pulse 90, temperature 99.5 F (37.5 C), temperature source Oral, resp. rate 16, weight 92.6 kg (204 lb 4 oz), SpO2 97 %.  PHYSICAL EXAMINATION:   Physical Exam  GENERAL:  77 y.o.-year-old patient lying in the bed with no acute distress.  Patient is at baseline according to patient's daughter. EYES: Pupils equal, round, reactive to light  HEENT: Head atraumatic, normocephalic. Oropharynx and nasopharynx clear.  NECK:  Supple, no jugular venous distention. No thyroid  enlargement, no tenderness.  LUNGS: Normal breath sounds bilaterally, no wheezing, rales,rhonchi or crepitation. No use of accessory muscles of respiration.  CARDIOVASCULAR: S1, S2 normal. No murmurs, rubs, or gallops.  ABDOMEN: Soft, nontender, nondistended. Bowel sounds present. No organomegaly or mass.  EXTREMITIES: No pedal edema, cyanosis, or clubbing.  NEUROLOGIC' awake, alert, able to say that she is hungry.  Patient has previous stroke and weak on the left side. PSYCHIATRIC: The patient is alert and oriented x 3.  SKIN: No obvious rash, lesion, or ulcer.    LABORATORY PANEL:   CBC Recent Labs  Lab 02/21/18 0528  WBC 6.8  HGB 10.1*  HCT 30.6*  PLT 187   ------------------------------------------------------------------------------------------------------------------  Chemistries  Recent Labs  Lab 02/20/18 0926 02/21/18 0528  NA 140 140  K 2.9* 3.5  CL 101 106  CO2 28 26  GLUCOSE 135* 107*  BUN 32* 25*  CREATININE 1.11* 0.89  CALCIUM 9.4 9.2  AST 28  --   ALT 15  --   ALKPHOS 104  --   BILITOT 0.5  --    ------------------------------------------------------------------------------------------------------------------  Cardiac Enzymes Recent Labs  Lab 02/20/18 0926  TROPONINI <0.03   ------------------------------------------------------------------------------------------------------------------  RADIOLOGY:  Dg Chest 1 View  Result Date: 02/20/2018 CLINICAL DATA:  Altered mental status and vomiting EXAM: CHEST  1 VIEW COMPARISON:  None. FINDINGS: No edema or consolidation. There is mild cardiac enlargement. The pulmonary vascularity is normal. No adenopathy. There is aortic atherosclerosis. No bone lesions. IMPRESSION: Mild cardiac enlargement. No edema or consolidation. There is aortic atherosclerosis. Aortic Atherosclerosis (ICD10-I70.0). Electronically Signed   By: Bretta Bang III M.D.   On: 02/20/2018 09:43  Ct Head Wo Contrast  Result Date:  02/20/2018 CLINICAL DATA:  Altered mental status and vomiting EXAM: CT HEAD WITHOUT CONTRAST TECHNIQUE: Contiguous axial images were obtained from the base of the skull through the vertex without intravenous contrast. COMPARISON:  Head CT July 22, 2017 and brain MRI July 22, 2017 FINDINGS: Brain: Mild to moderate diffuse atrophy is stable. There is evidence of a prior infarct involving portions of the right mid to posterior frontal and anterior parietal lobes as well as a portion of the superior right temporal lobe consistent with a sizable right middle cerebral artery distribution infarct, stable. Elsewhere there is patchy small vessel disease in the centra semiovale bilaterally. There is also small vessel disease in the right internal capsule. There is decreased attenuation in the mid right thalamus consistent with a small prior lacunar infarct. There is no intracranial mass, hemorrhage, extra-axial fluid collection, or midline shift. No acute infarct is demonstrable on this study. Vascular: There is no appreciable hyperdense vessel. There is calcification in each carotid siphon. Skull: Bony calvarium appears intact. Sinuses/Orbits: There is mild mucosal thickening in several ethmoid air cells. Other visualized paranasal sinuses are clear. Visualized orbits appear symmetric bilaterally. Other: There is opacification with air-fluid level in the inferior left mastoid air cell region. Mastoid air cells elsewhere appear normal. IMPRESSION: 1. Prior infarct involving portions of the right mid to posterior frontal, anterior parietal, and superior right temporal lobes, stable. There is stable atrophy with mild patchy periventricular small vessel disease. Small vessel disease also noted in the right internal capsule and right thalamus. No acute infarct demonstrable. No mass or hemorrhage. 2.  Foci of arterial vascular calcification noted. 3.  Areas of ethmoid sinus mucosal thickening. 4. Inferior left mastoid  air cell disease with air-fluid level in the inferior left mastoid region. Electronically Signed   By: Bretta Bang III M.D.   On: 02/20/2018 10:01   Mr Brain Wo Contrast  Result Date: 02/21/2018 CLINICAL DATA:  Acute presentation with altered mental status. Possible seizures. EXAM: MRI HEAD WITHOUT CONTRAST TECHNIQUE: Multiplanar, multiecho pulse sequences of the brain and surrounding structures were obtained without intravenous contrast. COMPARISON:  CT 02/20/2018.  MRI 07/22/2017. FINDINGS: Brain: Diffusion imaging does not show any acute or subacute infarction or other cause of restricted diffusion. Chronic small vessel ischemic changes affect the pons. No focal cerebellar insult. Old infarction in the right middle cerebral artery territory affecting the deep insula and frontoparietal brain. Atrophy, encephalomalacia and gliosis in that region. Mild small vessel change elsewhere affecting the hemispheric white matter. No sign of acute hemorrhage, mass lesion, hydrocephalus or extra-axial collection. Vascular: Major vessels at the base of the brain show flow. Skull and upper cervical spine: Negative Sinuses/Orbits: Clear/normal Other: None IMPRESSION: No acute finding. 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 Electronically Signed   By: Paulina Fusi M.D.   On: 02/21/2018 10:54     ASSESSMENT AND PLAN:   Active Problems:   Seizures (HCC)   #1 altered mental status secondary to seizures: Started on  ivVimpat, MRI of the brain showed no acute changes, according to neurology notes start p.o. Vimpat .  Once seen by speech therapy and started on p.o. food. 2.  Hypokalemia: Replaced, potassium improved to 3.5. 3.  History of previous stroke, mostly bedridden, daughter requesting if she can go to rehab.  Physical therapy consult placed. 4.  GERD: Continue PPIs  #5 paroxisimal atrial fibrillation, patient is on apixaban 5 mg  twice daily. #6 history of  previous stroke so patient already on statins, apixaban #7. essential hypertension patient; BP is on soft side, on metoprolol, losartan but DC HCTZ because of hypokalemia and seizure.  Space the BP medicines. Discussed with patient's daughter at length. All the records are reviewed and case discussed with Care Management/Social Workerr. Management plans discussed with the patient, family and they are in agreement.  CODE STATUS: fullCode  TOTAL TIME TAKING CARE OF THIS PATIENT: 35 minutes.   POSSIBLE D/C IN 1-2 DAYS, DEPENDING ON CLINICAL CONDITION.   Katha HammingSnehalatha Khamryn Calderone M.D on 02/21/2018 at 1:11 PM  Between 7am to 6pm - Pager - [phone]  After 6pm go to [email or web address] - password [email or web address] [facility]  [facility]  Office  [phone]  CC: Primary care physician; Fleet ContrasAvbuere, Edwin, MD

## 2018-02-21 NOTE — Evaluation (Signed)
Clinical/Bedside Swallow Evaluation Patient Details  Name: Diana Bush MRN: [id number] Date of Birth: [birth year]  Today's Date: 02/21/2018 Time: SLP Start Time (ACUTE ONLY): 1150 SLP Stop Time (ACUTE ONLY): 1250 SLP Time Calculation (min) (ACUTE ONLY): 60 min  Past Medical History:  Past Medical History:  Diagnosis Date  . Borderline hyperlipidemia   . Difficulty sleeping    DUE TO PAIN  . GERD (gastroesophageal reflux disease)   . Hypertension   . Neuromuscular disorder (HCC)   . Osteoarthritis   . PAF (paroxysmal atrial fibrillation) (HCC)   . Stroke [facility]) 07/2016   Past Surgical History:  Past Surgical History:  Procedure Laterality Date  . IR GENERIC HISTORICAL  07/28/2016   IR PERCUTANEOUS ART THROMBECTOMY/INFUSION INTRACRANIAL INC DIAG ANGIO 07/28/2016 Julieanne Cotton, MD [facility]-INTERV RAD  . LYMPH NODE BIOPSY     BENIGN  . RADIOLOGY WITH ANESTHESIA N/A 07/28/2016   Procedure: RADIOLOGY WITH ANESTHESIA;  Surgeon: Julieanne Cotton, MD;  Location: [facility] OR;  Service: Radiology;  Laterality: N/A;  . TOTAL HIP ARTHROPLASTY Right 08/22/2015   Procedure: RIGHT TOTAL HIP ARTHROPLASTY ANTERIOR APPROACH;  Surgeon: Kathryne Hitch, MD;  Location: [facility] ORS;  Service: Orthopedics;  Laterality: Right;   HPI:  Pt is a 77 y.o. female with a known history of CVA, atrial fibrillation on Eliquis, hypertension presents to the emergency room due to altered mental status.  Patient was thought to have seizures.  Here in the emergency room she was found to have jerking of right upper extremity.  Seen by neurology Dr. Thad Ranger.  Started on Vimpat.  Patient CT scan shows old strokes but nothing acute.  Patient is being admitted for management of seizures, MRI. Today's MRI revealed No acute finding. 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    Assessment / Plan / Recommendation Clinical Impression  Pt appears to present w/ min Oral phase dysphagia suspect  impacted by her Cognitive status post previous CVA, atrophy and encephalomalacia and gliosis. W/ the oral phase impact, the pharyngeal phase of swallowing can be impacted as well. Pt appeared to demonstrated no overt s/s of aspiration w/ the trials of purees, soft solids, and thin liquids via Cup; clear vocal quality post trials and no decline in respiratory status post trials. Oral phase c/b min slower bolus management and oral clearing of increased textured foods; no deficits noted w/ thin liquid and puree consistencies given. Pt fed self w/ mod support and setup d/t weakness. Noted min Cognitive confusion attempting to drink from Cup x2 requiring verbal/tactile cues to reorganize. Recommend a dysphagia level 3 diet w/ thin liquids, more minced foods and purees in the diet; general aspiration precautions; Pills in Puree whole vs crushed or in liquid forms for easier swallowing. Support w/ feeding at meals d/t weakness, Cognitive status. Dietician f/u as needed. Thorough education given to Daughter re: aspiration precautions, food consistencies and preparation, strategies during oral intake to support.  SLP Visit Diagnosis: Dysphagia, oropharyngeal phase (R13.12)(min)    Aspiration Risk  (reduced following aspiration precautions)    Diet Recommendation  Dysphagia level 3 (meats cut small, moistened) w/ Thin liquids. General aspiration precautions; support at meals for self-feeding.  Medication Administration: Crushed with puree(if needed for safer swallowing; liquid forms)    Other  Recommendations Recommended Consults: (Dietician f/u) Oral Care Recommendations: Oral care BID;Staff/trained caregiver to provide oral care Other Recommendations: (n/a)   Follow up Recommendations None(pt appears at her baseline per Dtr's report)  Frequency and Duration (n/a)  (n/a)       Prognosis Prognosis for Safe Diet Advancement: Fair(-good) Barriers to Reach Goals: Cognitive deficits;Severity of  deficits(previous CVA; atrophy)      Swallow Study   General Date of Onset: 02/20/18 HPI: Pt is a 77 y.o. female with a known history of CVA, atrial fibrillation on Eliquis, hypertension presents to the emergency room due to altered mental status.  Patient was thought to have seizures.  Here in the emergency room she was found to have jerking of right upper extremity.  Seen by neurology Dr. Thad Rangereynolds.  Started on Vimpat.  Patient CT scan shows old strokes but nothing acute.  Patient is being admitted for management of seizures, MRI. Today's MRI revealed No acute finding. 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  Type of Study: Bedside Swallow Evaluation Previous Swallow Assessment: none reported Diet Prior to this Study: NPO Temperature Spikes Noted: No(wbc 6.8; temp 99.5) Respiratory Status: Room air History of Recent Intubation: No Behavior/Cognition: Alert;Cooperative;Pleasant mood;Confused;Distractible;Requires cueing Oral Cavity Assessment: Within Functional Limits Oral Care Completed by SLP: Recent completion by staff Oral Cavity - Dentition: Adequate natural dentition;Missing dentition(few) Vision: Functional for self-feeding(adequate) Self-Feeding Abilities: Able to feed self;Needs assist;Needs set up Patient Positioning: Upright in bed Baseline Vocal Quality: Normal(when she spoke) Volitional Cough: Strong Volitional Swallow: Able to elicit    Oral/Motor/Sensory Function Overall Oral Motor/Sensory Function: Mild impairment(slight) Facial ROM: Within Functional Limits Facial Symmetry: Within Functional Limits Facial Strength: Within Functional Limits Lingual ROM: Reduced left Lingual Symmetry: Abnormal symmetry left(slight) Lingual Strength: Within Functional Limits Velum: Within Functional Limits Mandible: Within Functional Limits   Ice Chips Ice chips: Within functional limits Presentation: Spoon(fed; 3 trials)   Thin Liquid  Thin Liquid: Within functional limits Presentation: Cup;Self Fed(~3 ozs) Other Comments: squeezed cup tightly    Nectar Thick Nectar Thick Liquid: Not tested   Honey Thick Honey Thick Liquid: Not tested   Puree Puree: Within functional limits Presentation: Spoon(fed; 8 trials)   Solid   GO   Solid: Impaired(mech soft trial consistencies) Presentation: Spoon(fed; 4 trials) Oral Phase Impairments: Impaired mastication;Poor awareness of bolus Oral Phase Functional Implications: Impaired mastication;Prolonged oral transit Pharyngeal Phase Impairments: (none) Other Comments: min confusion re: chewing the solids vs sucking on the crackers to remove the applesauce       Jerilynn SomKatherine Ambre Kobayashi, MS, CCC-SLP [place] 02/21/2018,4:13 PM

## 2018-02-21 NOTE — Progress Notes (Signed)
Subjective: Patient returning to baseline.  Daughter reports that she is not following spoken commands as well as she did prior to this event.    Objective: Current vital signs: BP (!) 116/59   Pulse 90   Temp 99.5 F (37.5 C) (Oral)   Resp 16   Wt 92.6 kg (204 lb 4 oz)   SpO2 97%   BMI 37.36 kg/m  Vital signs in last 24 hours: Temp:  [98.1 F (36.7 C)-99.5 F (37.5 C)] 99.5 F (37.5 C) (04/16 0934) Pulse Rate:  [60-127] 90 (04/16 0934) Resp:  [15-22] 16 (04/16 0401) BP: (110-166)/(50-85) 116/59 (04/16 0934) SpO2:  [91 %-100 %] 97 % (04/16 0934) Weight:  [91.8 kg (202 lb 4.8 oz)-92.6 kg (204 lb 4 oz)] 92.6 kg (204 lb 4 oz) (04/16 0626)  Intake/Output from previous day: 04/15 0701 - 04/16 0700 In: 565 [I.V.:535; IV Piggyback:30] Out: -  Intake/Output this shift: No intake/output data recorded. Nutritional status: Seizure precautions Diet NPO time specified  Neurologic Exam: Mental Status: Awake and alert.  Able to say hello and tell me that she is hungry.  Follows some simple commands.      Cranial Nerves: II: patient does not respond confrontation from the left, pupils right 3 mm, left 4 mm,and reactive bilaterally III,IV,VI: EOM grossly intact V,VII: left facial droop VIII: grossly intact IX,X: gag reflex reduced, XI: trapezius strength unable to test bilaterally XII: tongue midline Motor: Moves RUE spontaneously against gravity.  Left upper extremity with increased tone and fisted hand at 0/5.     Lab Results: Basic Metabolic Panel: Recent Labs  Lab 02/20/18 0926 02/21/18 0528  NA 140 140  K 2.9* 3.5  CL 101 106  CO2 28 26  GLUCOSE 135* 107*  BUN 32* 25*  CREATININE 1.11* 0.89  CALCIUM 9.4 9.2    Liver Function Tests: Recent Labs  Lab 02/20/18 0926  AST 28  ALT 15  ALKPHOS 104  BILITOT 0.5  PROT 8.6*  ALBUMIN 4.3   Recent Labs  Lab 02/20/18 0926  LIPASE 36   No results for input(s): AMMONIA in the last 168 hours.  CBC: Recent Labs   Lab 02/20/18 0926 02/21/18 0528  WBC 10.6 6.8  NEUTROABS 7.8*  --   HGB 11.6* 10.1*  HCT 35.7 30.6*  MCV 85.8 84.3  PLT 196 187    Cardiac Enzymes: Recent Labs  Lab 02/20/18 0926  TROPONINI <0.03    Lipid Panel: No results for input(s): CHOL, TRIG, HDL, CHOLHDL, VLDL, LDLCALC in the last 168 hours.  CBG: Recent Labs  Lab 02/20/18 0921  GLUCAP 105*    Microbiology: Results for orders placed or performed during the hospital encounter of 07/28/16  MRSA PCR Screening     Status: None   Collection Time: 07/29/16  6:06 AM  Result Value Ref Range Status   MRSA by PCR NEGATIVE NEGATIVE Final    Comment:        The GeneXpert MRSA Assay (FDA approved for NASAL specimens only), is one component of a comprehensive MRSA colonization surveillance program. It is not intended to diagnose MRSA infection nor to guide or monitor treatment for MRSA infections.     Coagulation Studies: Recent Labs    02/20/18 0926  LABPROT 14.7  INR 1.16    Imaging: Dg Chest 1 View  Result Date: 02/20/2018 CLINICAL DATA:  Altered mental status and vomiting EXAM: CHEST  1 VIEW COMPARISON:  None. FINDINGS: No edema or consolidation. There is mild cardiac  enlargement. The pulmonary vascularity is normal. No adenopathy. There is aortic atherosclerosis. No bone lesions. IMPRESSION: Mild cardiac enlargement. No edema or consolidation. There is aortic atherosclerosis. Aortic Atherosclerosis (ICD10-I70.0). Electronically Signed   By: Bretta Bang III M.D.   On: 02/20/2018 09:43   Ct Head Wo Contrast  Result Date: 02/20/2018 CLINICAL DATA:  Altered mental status and vomiting EXAM: CT HEAD WITHOUT CONTRAST TECHNIQUE: Contiguous axial images were obtained from the base of the skull through the vertex without intravenous contrast. COMPARISON:  Head CT July 22, 2017 and brain MRI July 22, 2017 FINDINGS: Brain: Mild to moderate diffuse atrophy is stable. There is evidence of a prior  infarct involving portions of the right mid to posterior frontal and anterior parietal lobes as well as a portion of the superior right temporal lobe consistent with a sizable right middle cerebral artery distribution infarct, stable. Elsewhere there is patchy small vessel disease in the centra semiovale bilaterally. There is also small vessel disease in the right internal capsule. There is decreased attenuation in the mid right thalamus consistent with a small prior lacunar infarct. There is no intracranial mass, hemorrhage, extra-axial fluid collection, or midline shift. No acute infarct is demonstrable on this study. Vascular: There is no appreciable hyperdense vessel. There is calcification in each carotid siphon. Skull: Bony calvarium appears intact. Sinuses/Orbits: There is mild mucosal thickening in several ethmoid air cells. Other visualized paranasal sinuses are clear. Visualized orbits appear symmetric bilaterally. Other: There is opacification with air-fluid level in the inferior left mastoid air cell region. Mastoid air cells elsewhere appear normal. IMPRESSION: 1. Prior infarct involving portions of the right mid to posterior frontal, anterior parietal, and superior right temporal lobes, stable. There is stable atrophy with mild patchy periventricular small vessel disease. Small vessel disease also noted in the right internal capsule and right thalamus. No acute infarct demonstrable. No mass or hemorrhage. 2.  Foci of arterial vascular calcification noted. 3.  Areas of ethmoid sinus mucosal thickening. 4. Inferior left mastoid air cell disease with air-fluid level in the inferior left mastoid region. Electronically Signed   By: Bretta Bang III M.D.   On: 02/20/2018 10:01   Mr Brain Wo Contrast  Result Date: 02/21/2018 CLINICAL DATA:  Acute presentation with altered mental status. Possible seizures. EXAM: MRI HEAD WITHOUT CONTRAST TECHNIQUE: Multiplanar, multiecho pulse sequences of the brain  and surrounding structures were obtained without intravenous contrast. COMPARISON:  CT 02/20/2018.  MRI 07/22/2017. FINDINGS: Brain: Diffusion imaging does not show any acute or subacute infarction or other cause of restricted diffusion. Chronic small vessel ischemic changes affect the pons. No focal cerebellar insult. Old infarction in the right middle cerebral artery territory affecting the deep insula and frontoparietal brain. Atrophy, encephalomalacia and gliosis in that region. Mild small vessel change elsewhere affecting the hemispheric white matter. No sign of acute hemorrhage, mass lesion, hydrocephalus or extra-axial collection. Vascular: Major vessels at the base of the brain show flow. Skull and upper cervical spine: Negative Sinuses/Orbits: Clear/normal Other: None IMPRESSION: No acute finding. 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 Electronically Signed   By: Paulina Fusi M.D.   On: 02/21/2018 10:54    Medications:  I have reviewed the patient's current medications. Scheduled: . apixaban  5 mg Oral BID  . atorvastatin  20 mg Oral q1800  . gabapentin  300 mg Oral TID  . losartan  100 mg Oral Daily   And  . hydrochlorothiazide  25 mg  Oral Daily  . metoprolol tartrate  50 mg Oral BID  . sodium chloride flush  3 mL Intravenous Q12H    Assessment/Plan: Patient improved today.  MRI of the brain reviewed and shows her chronic ischemic events but no new abnormalities.  Tolerating Vimpat at 50mg  q 12 hours.    Recommendations: 1. Once patient able to take po would change Vimpat to po at 50mg  BID.   2. Seizure precautions   LOS: 1 day   Thana FarrLeslie Dixie Coppa, MD Neurology [phone] 02/21/2018  12:15 PM

## 2018-02-21 NOTE — Evaluation (Signed)
Physical Therapy Evaluation Patient Details Name: Diana Bush MRN: [id number] DOB: [birth year] Today's Date: 02/21/2018   History of Present Illness  Pt admitted for seizures. Pt with complaints of AMS. History includes CVA, afib, HTN, and GERD.   Clinical Impression  Pt is a pleasant 77 year old female who was admitted for seizures. Pt performs bed mobility with max assist. Unable to progress to OOB mobility at this time. At baseline, pt was able to Pivot transfer to [facility], however has been unable to do that recently and family has been using hoyer lift for mobility. Pt demonstrates deficits with strength/mobility. Pt with increased L hemibody weakness at this time compared to baseline residual weakness s/p chronic CVA. Pt appears motivated to participate in therapy to return to previous baseline. Would benefit from skilled PT to address above deficits and promote optimal return to PLOF; recommend transition to STR upon discharge from acute hospitalization.       Follow Up Recommendations SNF    Equipment Recommendations  (is requesting new WC)    Recommendations for Other Services       Precautions / Restrictions Precautions Precautions: Fall Restrictions Weight Bearing Restrictions: No      Mobility  Bed Mobility Overal bed mobility: Needs Assistance Bed Mobility: Rolling Rolling: Max assist         General bed mobility comments: needed max assist for rolling towards L side, able to reach with R arm, however unable to fully transition to side. Unable to reach or attempt rolling towards R side without total assistance.  Transfers                 General transfer comment: unable to attempt due to weakness  Ambulation/Gait                Stairs            Wheelchair Mobility    Modified Rankin (Stroke Patients Only)       Balance Overall balance assessment: (unable to attempt)                                            Pertinent Vitals/Pain Pain Assessment: No/denies pain    Home Living Family/patient expects to be discharged to:: Private residence Living Arrangements: Children;Other relatives Available Help at Discharge: Family;Available 24 hours/day Type of Home: House Home Access: Level entry     Home Layout: One level Home Equipment: Walker - 2 wheels;Wheelchair - manual;Hospital bed(hoyer lift)      Prior Function Level of Independence: Needs assistance         Comments: needs assist with ADLs. Was previously able to sit and transfer to [facility] using RW, however hasn't been able to ambulate in a year. Now has been recently using the hoyer lift for all mobility.      Hand Dominance        Extremity/Trunk Assessment   Upper Extremity Assessment Upper Extremity Assessment: Generalized weakness;LUE deficits/detail(R UE grossly 4/5) LUE Deficits / Details: in fisted position 0/5 strength LUE Coordination: (unable to test)    Lower Extremity Assessment Lower Extremity Assessment: Generalized weakness;LLE deficits/detail(R LE grossly 3+/5) LLE Deficits / Details: grossly 2/5 LLE Sensation: WNL       Communication   Communication: Expressive difficulties  Cognition Arousal/Alertness: Awake/alert Behavior During Therapy: WFL for tasks assessed/performed Overall Cognitive Status: History of cognitive impairments - at  baseline                                        General Comments      Exercises Other Exercises Other Exercises: Attempted ther-ex and rolling in bed multiple times, however pt quickly fatigues with decreased use of L side noted. Unable to achieve sitting at EOB this date due to weakness.   Assessment/Plan    PT Assessment Patient needs continued PT services  PT Problem List Decreased strength;Decreased balance;Decreased mobility;Obesity       PT Treatment Interventions Therapeutic activities;Therapeutic exercise;Balance training;Wheelchair  mobility training    PT Goals (Current goals can be found in the Care Plan section)  Acute Rehab PT Goals Patient Stated Goal: [facility] get stronger [facility] PT Goal Formulation: With patient Time For Goal Achievement: 03/07/18 Potential to Achieve Goals: Fair    Frequency Min 2X/week   Barriers to discharge        Co-evaluation               AM-PAC PT "6 Clicks" Daily Activity  Outcome Measure Difficulty turning over in bed (including adjusting bedclothes, sheets and blankets)?: Unable Difficulty moving from lying on back to sitting on the side of the bed? : Unable Difficulty sitting down on and standing up from a chair with arms (e.g., wheelchair, bedside commode, etc,.)?: Unable Help needed moving to and from a bed to chair (including a wheelchair)?: Total Help needed walking in hospital room?: Total Help needed climbing 3-5 steps with a railing? : Total 6 Click Score: 6    End of Session   Activity Tolerance: Patient tolerated treatment well Patient left: in bed;with bed alarm set Nurse Communication: Mobility status PT Visit Diagnosis: Unsteadiness on feet (R26.81);Muscle weakness (generalized) (M62.81);Difficulty in walking, not elsewhere classified (R26.2);Hemiplegia and hemiparesis Hemiplegia - Right/Left: Left Hemiplegia - dominant/non-dominant: Non-dominant Hemiplegia - caused by: Cerebral infarction    Time: [id number] PT Time Calculation (min) (ACUTE ONLY): 24 min   Charges:   PT Evaluation $PT Eval Low Complexity: 1 Low PT Treatments $Therapeutic Activity: 8-22 mins   PT G Codes:        Elizabeth PalauStephanie Milayah Krell, PT, DPT [phone]   Keela Rubert 02/21/2018, 3:42 PM

## 2018-02-22 DIAGNOSIS — R569 Unspecified convulsions: Secondary | ICD-10-CM | POA: Diagnosis not present

## 2018-02-22 MED ORDER — LACOSAMIDE 50 MG PO TABS
50.0000 mg | ORAL_TABLET | Freq: Two times a day (BID) | ORAL | Status: DC
  Administered 2018-02-22 – 2018-02-23 (×3): 50 mg via ORAL
  Filled 2018-02-22 (×3): qty 1

## 2018-02-22 NOTE — Progress Notes (Signed)
OT Cancellation Note  Patient Details Name: Diana Bush MRN: [id number] DOB: December 02, 1940   Cancelled Treatment:    Reason Eval/Treat Not Completed: Other (comment) Eval attempted, but pt in middle of meal, feeding self independently. Will evaluate pt as available and appropriate.  Dalphine HandingKaylee Davonte Siebenaler 02/22/2018, 6:21 PM

## 2018-02-22 NOTE — [place] FL2 (Signed)
[place] MEDICAID FL2 LEVEL OF CARE SCREENING TOOL     IDENTIFICATION  Patient Name: Diana ColonelMary Benedicto Birthdate: 30-Aug-1941 Sex: female Admission Date (Current Location): 02/20/2018  [place]   Facility and Address:  [facility], [address], [place], [place] [address]      Provider Number: [id number]  Attending Physician Name and Address:  Katha HammingKonidena, Snehalatha, MD  Relative Name and Phone Number:  Gerilyn NestleRebecca Himmelberger  [phone]    Current Level of Care: Hospital Recommended Level of Care: Skilled Nursing Facility Prior Approval Number:    Date Approved/Denied:   PASRR Number: [id number] [id number]  Discharge Plan: SNF    Current Diagnoses: Patient Active Problem List   Diagnosis Date Noted  . Seizures (HCC) 02/20/2018  . Persistent atrial fibrillation (HCC) 05/12/2017  . Hypertensive emergency 08/04/2016  . Hypokalemia 08/04/2016  . Depression   . Cerebrovascular accident (CVA) due to thrombosis of right middle cerebral artery (HCC) s/p mechanical thrombectomy   . Hemiplegia affecting left nondominant side (HCC)   . HLD (hyperlipidemia)   . Dysphagia, post-stroke   . Dysarthria, post-stroke   . Expressive aphasia   . Acute blood loss anemia   . Benign essential HTN   . Atrial fibrillation with RVR (HCC) 07/29/2016  . Osteoarthritis of right hip 08/22/2015  . 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    Orientation RESPIRATION BLADDER Height & Weight     Self  Normal Incontinent Weight: 212 lb 8.4 oz (96.4 kg) Height:     BEHAVIORAL SYMPTOMS/MOOD NEUROLOGICAL BOWEL NUTRITION STATUS    Convulsions/Seizures Incontinent Diet(Dysphagia 3)  AMBULATORY STATUS COMMUNICATION OF NEEDS Skin   Total Care Verbally Normal                       Personal Care Assistance Level of Assistance  Total care     Dressing Assistance: Maximum assistance Total Care Assistance: Maximum assistance   Functional  Limitations Info             SPECIAL CARE FACTORS FREQUENCY  PT (By licensed PT), OT (By licensed OT), Speech therapy                    Contractures Contractures Info: Not present    Additional Factors Info  Code Status, Allergies Code Status Info: Full Code   Allergies Info: NKA           Current Medications (02/22/2018):  This is the current hospital active medication list Current Facility-Administered Medications  Medication Dose Route Frequency Provider Last Rate Last Dose  . acetaminophen (TYLENOL) tablet 650 mg  650 mg Oral Q6H PRN Milagros LollSudini, Srikar, MD   650 mg at 02/22/18 0413   Or  . acetaminophen (TYLENOL) suppository 650 mg  650 mg Rectal Q6H PRN Sudini, Wardell HeathSrikar, MD      . albuterol (PROVENTIL) (2.5 MG/3ML) 0.083% nebulizer solution 2.5 mg  2.5 mg Nebulization Q2H PRN Sudini, Wardell HeathSrikar, MD      . apixaban Everlene Balls(ELIQUIS) tablet 5 mg  5 mg Oral BID Milagros LollSudini, Srikar, MD   5 mg at 02/21/18 2155  . atorvastatin (LIPITOR) tablet 20 mg  20 mg Oral q1800 Milagros LollSudini, Srikar, MD   20 mg at 02/21/18 1723  . bisacodyl (DULCOLAX) suppository 10 mg  10 mg Rectal Daily PRN Sudini, Wardell HeathSrikar, MD      . gabapentin (NEURONTIN) capsule 300 mg  300 mg Oral TID Milagros LollSudini, Srikar, MD   300  mg at 02/21/18 2155  . hydrALAZINE (APRESOLINE) injection 10 mg  10 mg Intravenous Q6H PRN Sudini, Wardell Heath, MD      . lacosamide (VIMPAT) tablet 50 mg  50 mg Oral BID Katha Hamming, MD      . losartan (COZAAR) tablet 100 mg  100 mg Oral Daily Milagros Loll, MD   100 mg at 02/21/18 1309  . metoprolol tartrate (LOPRESSOR) tablet 50 mg  50 mg Oral BID Milagros Loll, MD   50 mg at 02/21/18 2155  . morphine 2 MG/ML injection 2 mg  2 mg Intravenous Q4H PRN Barbaraann Rondo, MD   2 mg at 02/21/18 0439  . ondansetron (ZOFRAN) tablet 4 mg  4 mg Oral Q6H PRN Milagros Loll, MD       Or  . ondansetron (ZOFRAN) injection 4 mg  4 mg Intravenous Q6H PRN Milagros Loll, MD   4 mg at 02/20/18 1357  . polyethylene glycol  (MIRALAX / GLYCOLAX) packet 17 g  17 g Oral Daily PRN Sudini, Wardell Heath, MD      . sodium chloride flush (NS) 0.9 % injection 3 mL  3 mL Intravenous Q12H Milagros Loll, MD   3 mL at 02/21/18 2155     Discharge Medications: Please see discharge summary for a list of discharge medications.  Relevant Imaging Results:  Relevant Lab Results:   Additional Information SSN [id number]  Ruthe Mannan, [place]

## 2018-02-22 NOTE — Clinical Social Work Note (Addendum)
CSW spoke with patient and her daughter and extended bed offers. They have chosen [facility]. [facility]. CSW working on obtaining authorization from [employer]. York SpanielMonica Amirr Achord MSW,LCSW [phone]

## 2018-02-22 NOTE — Progress Notes (Signed)
[facility] [place] [facility]   PATIENT NAME: Diana Bush    MR#:  [id number]  DATE OF BIRTH:  05-21-1941  She is doing much better, more alert and communicative today.  Complains of left leg pain Tylenol and left leg pain is gone now.  Asking for her daughter.  CHIEF COMPLAINT:   Chief Complaint  Patient presents with  . Altered Mental Status    REVIEW OF SYSTEMS:    Review of Systems  Constitutional: Negative for chills and fever.  HENT: Negative for hearing loss.   Eyes: Negative for blurred vision, double vision and photophobia.  Respiratory: Negative for cough, hemoptysis and shortness of breath.   Cardiovascular: Negative for palpitations, orthopnea and leg swelling.  Gastrointestinal: Negative for abdominal pain, diarrhea and vomiting.  Genitourinary: Negative for dysuria and urgency.  Musculoskeletal: Negative for myalgias and neck pain.  Skin: Negative for rash.  Neurological: Positive for speech change, focal weakness and weakness. Negative for dizziness, seizures and headaches.  Psychiatric/Behavioral: Positive for memory loss. The patient does not have insomnia.   Patient not following spoken commands and according to patient's daughter she is at her baseline now.  Nutrition:  Tolerating Diet: Tolerating PT:      DRUG ALLERGIES:  No Known Allergies  VITALS:  Blood pressure (!) 104/58, pulse (!) 57, temperature 98.2 F (36.8 C), temperature source Oral, resp. rate 18, weight 96.4 kg (212 lb 8.4 oz), SpO2 100 %.  PHYSICAL EXAMINATION:   Physical Exam  GENERAL:  77 y.o.-year-old patient lying in the bed with no acute distress.  Patient is at baseline according to patient's daughter. EYES: Pupils equal, round, reactive to light  HEENT: Head atraumatic, normocephalic. Oropharynx and nasopharynx clear.  NECK:  Supple, no jugular venous distention. No thyroid enlargement, no tenderness.  LUNGS: Normal breath sounds bilaterally,  no wheezing, rales,rhonchi or crepitation. No use of accessory muscles of respiration.  CARDIOVASCULAR: S1, S2 normal. No murmurs, rubs, or gallops.  ABDOMEN: Soft, nontender, nondistended. Bowel sounds present. No organomegaly or mass.  EXTREMITIES: No pedal edema, cyanosis, or clubbing.  NEUROLOGIC' awake, alert, able to say that she is hungry.  Patient has previous stroke and weak on the left side. PSYCHIATRIC: The patient is alert and oriented x 3.  SKIN: No obvious rash, lesion, or ulcer.    LABORATORY PANEL:   CBC Recent Labs  Lab 02/21/18 0528  WBC 6.8  HGB 10.1*  HCT 30.6*  PLT 187   ------------------------------------------------------------------------------------------------------------------  Chemistries  Recent Labs  Lab 02/20/18 0926 02/21/18 0528  NA 140 140  K 2.9* 3.5  CL 101 106  CO2 28 26  GLUCOSE 135* 107*  BUN 32* 25*  CREATININE 1.11* 0.89  CALCIUM 9.4 9.2  AST 28  --   ALT 15  --   ALKPHOS 104  --   BILITOT 0.5  --    ------------------------------------------------------------------------------------------------------------------  Cardiac Enzymes Recent Labs  Lab 02/20/18 0926  TROPONINI <0.03   ------------------------------------------------------------------------------------------------------------------  RADIOLOGY:  Mr Brain Wo Contrast  Result Date: 02/21/2018 CLINICAL DATA:  Acute presentation with altered mental status. Possible seizures. EXAM: MRI HEAD WITHOUT CONTRAST TECHNIQUE: Multiplanar, multiecho pulse sequences of the brain and surrounding structures were obtained without intravenous contrast. COMPARISON:  CT 02/20/2018.  MRI 07/22/2017. FINDINGS: Brain: Diffusion imaging does not show any acute or subacute infarction or other cause of restricted diffusion. Chronic small vessel ischemic changes affect the pons. No focal cerebellar insult. Old infarction in the right middle  cerebral artery territory affecting the deep insula  and frontoparietal brain. Atrophy, encephalomalacia and gliosis in that region. Mild small vessel change elsewhere affecting the hemispheric white matter. No sign of acute hemorrhage, mass lesion, hydrocephalus or extra-axial collection. Vascular: Major vessels at the base of the brain show flow. Skull and upper cervical spine: Negative Sinuses/Orbits: Clear/normal Other: None IMPRESSION: No acute finding. 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 Electronically Signed   By: Paulina Fusi M.D.   On: 02/21/2018 10:54     ASSESSMENT AND PLAN:   Active Problems:   Seizures (HCC)   #1 altered mental status secondary to seizures: Started on  ivVimpat, MRI of the brain showed no acute changes, according to neurology notes start p.o. Vimpat .  Started on Vimpat tablets, discontinue IV Vimpat.  Discussed with patient's nurse.   2.  Hypokalemia: Replaced, potassium improved to 3.5. 3.  History of previous stroke, mostly bedridden, daughter requesting if she can go to rehab.  Therapy recommends SNF placement. 4.  GERD: Continue PPIs  #5 paroxisimal atrial fibrillation, patient is on apixaban 5 mg twice daily. #6 history of previous stroke so patient already on statins, apixaban #7. essential hypertension patient; BP is on soft side, on metoprolol, losartan but DC HCTZ because of hypokalemia and seizure.  Space the BP medicines. Stable for discharge when the arrangements are made.  Will discuss with patient's daughter also. All the records are reviewed and case discussed with Care Management/Social Workerr. Management plans discussed with the patient, family and they are in agreement.  CODE STATUS: fullCode  TOTAL TIME TAKING CARE OF THIS PATIENT: 35 minutes.   POSSIBLE D/C IN 1-2 DAYS, DEPENDING ON CLINICAL CONDITION.   Katha Hamming M.D on 02/22/2018 at 10:06 AM  Between 7am to 6pm - Pager - [phone]  After 6pm go to [email or web address] - password  [facility]  [place] [facility]  Office  [phone]  CC: Primary care physician; [name], MD

## 2018-02-22 NOTE — Progress Notes (Signed)
  Speech Language Pathology Treatment: Dysphagia  Patient Details Name: Diana Bush MRN: [id number] DOB: Dec 02, 1940 Today's Date: 02/22/2018 Time: 1300-1340 SLP Time Calculation (min) (ACUTE ONLY): 40 min  Assessment / Plan / Recommendation Clinical Impression  Pt seen today per Dtr's request for monitoring of pt's status post initial evaluation yesterday; education w/ precautions/strategies. Pt appears to present w/ min Oral phase dysphagia suspect impacted by her Cognitive status post previous CVA, atrophy and encephalomalacia and gliosis.  Pt's oral phase management during po trials w/ foods chosen (mech soft consistency) was adequate w/ oral clearing b/t trials. Pt took her time during mastication and often alternated solids w/ tsps of soup. No overt s/s of aspiration noted; no decline in respiratory status noted during po intake. Pt fed self w/ mod tray setup d/t overall weakness. Noted min Cognitive confusion looking around tray for items; verbal/tactile cues to reorganize. W/ any oral phase impact or Cognitive decline, the pharyngeal phase of swallowing can be impacted as well. Recommend a dysphagia level 3 diet w/ thin liquids, more minced foods; general aspiration precautions; Pills in Puree whole vs crushed or in liquid forms for easier swallowing. Support and monitoring at meals d/t weakness, Cognitive status. Dietician f/u as needed. Discussed education w/ Daughter re: aspiration precautions, food consistencies and preparation, strategies during oral intake to support. Pt appears at her baseline per Dtr's report. ST will sign off at this time. NSG updated.     HPI HPI: Pt is a 77 y.o. female with a known history of CVA, atrial fibrillation on Eliquis, hypertension presents to the emergency room due to altered mental status.  Patient was thought to have seizures.  Here in the emergency room she was found to have jerking of right upper extremity.  Seen by neurology Dr. Doy Mince.  Started on  Vimpat.  Patient CT scan shows old strokes but nothing acute.  Patient is being admitted for management of seizures, MRI. Today's MRI revealed No acute finding. 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       SLP Plan  All goals met       Recommendations  Diet recommendations: Thin liquid;Dysphagia 3 (mechanical soft) Liquids provided via: Cup(monitor any straw use) Medication Administration: Whole meds with puree(crushed as needed for easier, safer swallowing) Supervision: Patient able to self feed;Intermittent supervision to cue for compensatory strategies(unsure of pt's baseline Cognitive status, follow through) Compensations: Minimize environmental distractions;Slow rate;Small sips/bites;Lingual sweep for clearance of pocketing;Follow solids with liquid Postural Changes and/or Swallow Maneuvers: Seated upright 90 degrees;Upright 30-60 min after meal                General recommendations: (Dietician f/u as needed) Oral Care Recommendations: Oral care BID;Staff/trained caregiver to provide oral care;Patient independent with oral care Follow up Recommendations: None SLP Visit Diagnosis: Dysphagia, unspecified (R13.10) Plan: All goals met       GO               Diana Kenner, MS, CCC-SLP Watson,Katherine 02/22/2018, 1:57 PM

## 2018-02-22 NOTE — Progress Notes (Signed)
Subjective: Patient awake and alert.  No further seizure activity noted.    Objective: Current vital signs: BP (!) 104/58 (BP Location: Left Arm)   Pulse (!) 57   Temp 98.2 F (36.8 C) (Oral)   Resp 18   Wt 96.4 kg (212 lb 8.4 oz)   SpO2 100%   BMI 38.87 kg/m  Vital signs in last 24 hours: Temp:  [98.2 F (36.8 C)-99 F (37.2 C)] 98.2 F (36.8 C) (04/17 0449) Pulse Rate:  [57-81] 57 (04/17 0449) Resp:  [16-18] 18 (04/17 0449) BP: (104-116)/(58-66) 104/58 (04/17 0449) SpO2:  [98 %-100 %] 100 % (04/17 0449) Weight:  [96.4 kg (212 lb 8.4 oz)] 96.4 kg (212 lb 8.4 oz) (04/17 0449)  Intake/Output from previous day: 04/16 0701 - 04/17 0700 In: 150 [P.O.:120; IV Piggyback:30] Out: -  Intake/Output this shift: No intake/output data recorded. Nutritional status: Seizure precautions DIET DYS 3 Room service appropriate? Yes with Assist; Fluid consistency: Thin Aspiration precautions  Neurologic Exam: Mental Status: Awake and alert. Feeding self breakfast.  Speech noted but nonfluent.  Follows commands.    Cranial Nerves: II: patient does not respond confrontationfrom the left, pupils right32mm, left 35mm,and reactivebilaterally III,IV,VI: EOM grossly intact V,VII: leftfacial droop VIII: grossly intact IX,X: gag reflexreduced, XI: trapezius strength unable to test bilaterally XII: tongue midline Motor: Moves RUE spontaneously against gravity. Left upper extremity with increased tone and fisted hand at 0/5.     Lab Results: Basic Metabolic Panel: Recent Labs  Lab 02/20/18 0926 02/21/18 0528  NA 140 140  K 2.9* 3.5  CL 101 106  CO2 28 26  GLUCOSE 135* 107*  BUN 32* 25*  CREATININE 1.11* 0.89  CALCIUM 9.4 9.2    Liver Function Tests: Recent Labs  Lab 02/20/18 0926  AST 28  ALT 15  ALKPHOS 104  BILITOT 0.5  PROT 8.6*  ALBUMIN 4.3   Recent Labs  Lab 02/20/18 0926  LIPASE 36   No results for input(s): AMMONIA in the last 168  hours.  CBC: Recent Labs  Lab 02/20/18 0926 02/21/18 0528  WBC 10.6 6.8  NEUTROABS 7.8*  --   HGB 11.6* 10.1*  HCT 35.7 30.6*  MCV 85.8 84.3  PLT 196 187    Cardiac Enzymes: Recent Labs  Lab 02/20/18 0926  TROPONINI <0.03    Lipid Panel: No results for input(s): CHOL, TRIG, HDL, CHOLHDL, VLDL, LDLCALC in the last 168 hours.  CBG: Recent Labs  Lab 02/20/18 0921  GLUCAP 105*    Microbiology: Results for orders placed or performed during the hospital encounter of 07/28/16  MRSA PCR Screening     Status: None   Collection Time: 07/29/16  6:06 AM  Result Value Ref Range Status   MRSA by PCR NEGATIVE NEGATIVE Final    Comment:        The GeneXpert MRSA Assay (FDA approved for NASAL specimens only), is one component of a comprehensive MRSA colonization surveillance program. It is not intended to diagnose MRSA infection nor to guide or monitor treatment for MRSA infections.     Coagulation Studies: Recent Labs    02/20/18 0926  LABPROT 14.7  INR 1.16    Imaging: Dg Chest 1 View  Result Date: 02/20/2018 CLINICAL DATA:  Altered mental status and vomiting EXAM: CHEST  1 VIEW COMPARISON:  None. FINDINGS: No edema or consolidation. There is mild cardiac enlargement. The pulmonary vascularity is normal. No adenopathy. There is aortic atherosclerosis. No bone lesions. IMPRESSION: Mild cardiac enlargement.  No edema or consolidation. There is aortic atherosclerosis. Aortic Atherosclerosis (ICD10-I70.0). Electronically Signed   By: Bretta Bang III M.D.   On: 02/20/2018 09:43   Ct Head Wo Contrast  Result Date: 02/20/2018 CLINICAL DATA:  Altered mental status and vomiting EXAM: CT HEAD WITHOUT CONTRAST TECHNIQUE: Contiguous axial images were obtained from the base of the skull through the vertex without intravenous contrast. COMPARISON:  Head CT July 22, 2017 and brain MRI July 22, 2017 FINDINGS: Brain: Mild to moderate diffuse atrophy is stable. There  is evidence of a prior infarct involving portions of the right mid to posterior frontal and anterior parietal lobes as well as a portion of the superior right temporal lobe consistent with a sizable right middle cerebral artery distribution infarct, stable. Elsewhere there is patchy small vessel disease in the centra semiovale bilaterally. There is also small vessel disease in the right internal capsule. There is decreased attenuation in the mid right thalamus consistent with a small prior lacunar infarct. There is no intracranial mass, hemorrhage, extra-axial fluid collection, or midline shift. No acute infarct is demonstrable on this study. Vascular: There is no appreciable hyperdense vessel. There is calcification in each carotid siphon. Skull: Bony calvarium appears intact. Sinuses/Orbits: There is mild mucosal thickening in several ethmoid air cells. Other visualized paranasal sinuses are clear. Visualized orbits appear symmetric bilaterally. Other: There is opacification with air-fluid level in the inferior left mastoid air cell region. Mastoid air cells elsewhere appear normal. IMPRESSION: 1. Prior infarct involving portions of the right mid to posterior frontal, anterior parietal, and superior right temporal lobes, stable. There is stable atrophy with mild patchy periventricular small vessel disease. Small vessel disease also noted in the right internal capsule and right thalamus. No acute infarct demonstrable. No mass or hemorrhage. 2.  Foci of arterial vascular calcification noted. 3.  Areas of ethmoid sinus mucosal thickening. 4. Inferior left mastoid air cell disease with air-fluid level in the inferior left mastoid region. Electronically Signed   By: Bretta Bang III M.D.   On: 02/20/2018 10:01   Mr Brain Wo Contrast  Result Date: 02/21/2018 CLINICAL DATA:  Acute presentation with altered mental status. Possible seizures. EXAM: MRI HEAD WITHOUT CONTRAST TECHNIQUE: Multiplanar, multiecho pulse  sequences of the brain and surrounding structures were obtained without intravenous contrast. COMPARISON:  CT 02/20/2018.  MRI 07/22/2017. FINDINGS: Brain: Diffusion imaging does not show any acute or subacute infarction or other cause of restricted diffusion. Chronic small vessel ischemic changes affect the pons. No focal cerebellar insult. Old infarction in the right middle cerebral artery territory affecting the deep insula and frontoparietal brain. Atrophy, encephalomalacia and gliosis in that region. Mild small vessel change elsewhere affecting the hemispheric white matter. No sign of acute hemorrhage, mass lesion, hydrocephalus or extra-axial collection. Vascular: Major vessels at the base of the brain show flow. Skull and upper cervical spine: Negative Sinuses/Orbits: Clear/normal Other: None IMPRESSION: No acute finding. 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 Electronically Signed   By: Paulina Fusi M.D.   On: 02/21/2018 10:54    Medications:  I have reviewed the patient's current medications. Scheduled: . apixaban  5 mg Oral BID  . atorvastatin  20 mg Oral q1800  . gabapentin  300 mg Oral TID  . lacosamide  50 mg Oral BID  . losartan  100 mg Oral Daily  . metoprolol tartrate  50 mg Oral BID  . sodium chloride flush  3 mL Intravenous Q12H  Assessment/Plan: No further seizure activity noted.  Vimpat at 50mg  BID.  Patient tolerating Vimpat well.  Appears at baseline.  If further seizures noted to be increased to 100mg  BID.    Recommendations: 1. Continue seizure precautions  No further neurologic intervention is recommended at this time.  If further questions arise, please call or page at that time.  Thank you for allowing neurology to participate in the care of this patient.   LOS: 2 days   Thana FarrLeslie Tu Shimmel, MD Neurology [phone] 02/22/2018  9:34 AM

## 2018-02-23 DIAGNOSIS — R569 Unspecified convulsions: Secondary | ICD-10-CM | POA: Diagnosis not present

## 2018-02-23 MED ORDER — LOSARTAN POTASSIUM 50 MG PO TABS: 50.0000 mg | ORAL_TABLET | Freq: Every day | ORAL | 11 refills | Status: DC

## 2018-02-23 MED ORDER — BACLOFEN 10 MG PO TABS: 10.0000 mg | ORAL_TABLET | Freq: Three times a day (TID) | ORAL | 0 refills | Status: AC

## 2018-02-23 MED ORDER — GABAPENTIN 300 MG PO CAPS: 300.0000 mg | ORAL_CAPSULE | Freq: Three times a day (TID) | ORAL | 0 refills | Status: DC

## 2018-02-23 MED ORDER — METOPROLOL TARTRATE 50 MG PO TABS: 50.0000 mg | ORAL_TABLET | Freq: Two times a day (BID) | ORAL | 0 refills | Status: DC

## 2018-02-23 MED ORDER — LACOSAMIDE 50 MG PO TABS: 50.0000 mg | ORAL_TABLET | Freq: Two times a day (BID) | ORAL | 0 refills | Status: DC

## 2018-02-23 MED ORDER — LOSARTAN POTASSIUM 50 MG PO TABS: 50.0000 mg | ORAL_TABLET | Freq: Every day | ORAL | 1 refills | Status: AC

## 2018-02-23 MED ORDER — METOPROLOL TARTRATE 25 MG PO TABS: 25.0000 mg | ORAL_TABLET | Freq: Two times a day (BID) | ORAL | 0 refills | Status: AC

## 2018-02-23 NOTE — Progress Notes (Addendum)
MD order received to [facility] pt [facility] SNF [facility]; Care Management previously prepared discharge paperwork for pt; [facility] [phone] to RN, pt to be admitted to Room E15; no questions voiced at this time; pt's discharge pending arrival of EMS for non emergency transport

## 2018-02-23 NOTE — Clinical Social Work Placement (Signed)
   CLINICAL SOCIAL WORK PLACEMENT  NOTE  Date:  02/23/2018  Patient Details  Name: Diana ColonelMary Jonsson MRN: [id number] Date of Birth: 11/04/1941  Clinical Social Work is seeking post-discharge placement for this patient at the Skilled  Nursing Facility level of care (*CSW will initial, date and re-position this form in  chart as items are completed):  Yes   Patient/family provided with [facility] Clinical Social Work Department's list of facilities offering this level of care within the geographic area requested by the patient (or if unable, by the patient's family).  Yes   Patient/family informed of their freedom to choose among providers that offer the needed level of care, that participate in Medicare, Medicaid or managed care program needed by the patient, have an available bed and are willing to accept the patient.  Yes   Patient/family informed of [facility]'s ownership interest in [facility] and [facility], as well as of the fact that they are under no obligation to receive care at these facilities.  PASRR submitted to EDS on 02/22/18     PASRR number received on 02/22/18     Existing PASRR number confirmed on       FL2 transmitted to all facilities in geographic area requested by pt/family on 02/22/18     FL2 transmitted to all facilities within larger geographic area on       Patient informed that his/her managed care company has contracts with or will negotiate with certain facilities, including the following:        Yes   Patient/family informed of bed offers received.  Patient [facility] bed at [facility]     Physician recommends and patient chooses bed at [facility](SNF)    Patient to be transferred to   on 02/23/18.  Patient to be transferred to facility by (EMS)     Patient family notified on 02/23/18 of transfer.  Name of family member notified:  (Ms. Valda FaviaGarris; patient's daughter)     PHYSICIAN       Additional Comment:     _______________________________________________ York SpanielMonica Aarian Griffie, LCSW 02/23/2018, 4:47 PM

## 2018-02-23 NOTE — Care Management Important Message (Signed)
Important Message  Patient Details  Name: Diana [facility] MRN: [id number] Date of Birth: Mar 23, 1941   Medicare Important Message Given:  Yes    Olegario MessierKathy A Stormi Vandevelde 02/23/2018, 11:08 AM

## 2018-02-23 NOTE — Discharge Summary (Signed)
Diana [facility], is a 77 y.o. female  DOB 1941-04-12  MRN [id number].  Admission date:  02/20/2018  Admitting Physician  Milagros LollSrikar Sudini, MD  Discharge Date:  02/23/2018   Primary MD  Fleet ContrasAvbuere, Edwin, MD  Recommendations for primary care physician for things to follow:   With PCP in 1 week Follow-up with [name] in 1-2 weeks.   Admission Diagnosis  Seizure (HCC) [R56.9] Altered mental status, unspecified altered mental status type [R41.82]   Discharge Diagnosis  Seizure (HCC) [R56.9] Altered mental status, unspecified altered mental status type [R41.82]    Active Problems:   Seizures (HCC)      Past Medical History:  Diagnosis Date  . Borderline hyperlipidemia   . Difficulty sleeping    DUE TO PAIN  . GERD (gastroesophageal reflux disease)   . Hypertension   . Neuromuscular disorder (HCC)   . Osteoarthritis   . PAF (paroxysmal atrial fibrillation) (HCC)   . Stroke [facility](HCC) 07/2016    Past Surgical History:  Procedure Laterality Date  . IR GENERIC HISTORICAL  07/28/2016   IR PERCUTANEOUS ART THROMBECTOMY/INFUSION INTRACRANIAL INC DIAG ANGIO 07/28/2016 Diana CottonSanjeev Deveshwar, MD [facility]-INTERV RAD  . LYMPH NODE BIOPSY     BENIGN  . RADIOLOGY WITH ANESTHESIA N/A 07/28/2016   Procedure: RADIOLOGY WITH ANESTHESIA;  Surgeon: Diana CottonSanjeev Deveshwar, MD;  Location: [facility] OR;  Service: Radiology;  Laterality: N/A;  . TOTAL HIP ARTHROPLASTY Right 08/22/2015   Procedure: RIGHT TOTAL HIP ARTHROPLASTY ANTERIOR APPROACH;  Surgeon: Kathryne Hitchhristopher Y Blackman, MD;  Location: [facility] ORS;  Service: Orthopedics;  Laterality: Right;       History of present illness and  [facility] Course:     Kindly see H&P for history of present illness and admission details, please review complete Labs, Consult reports and Test reports for all details in  brief  HPI  from the history and physical done on the day of admission [age] year old female patient with history of previous stroke, essential hypertension brought in by family because of witnessed grand mal seizure at home, in the emergency room patient found to have jerking of right upper extremity, Dr.  Thad Rangereynolds from neurology saw this and then patient admitted for evaluation of seizures. [facility] Course  #1 seizures: CT head unremarkable on admission, MRI of the brain did not show acute structural abnormality.  Patient started on Vimpat 50 mg IV every 12 hours, initially she is made n.p.o. due to seizure and altered mental status and postictal status due to seizure.  Patient became more alert and started on the diet by speech therapy.  Patient daughter had questions about EEG but Dr. and also actually witness the seizure and she explained that EEG is arterial at this time.  Daughter for the patient to [facility].  Understands this and she is in agreement plan to continue Vimpat 50 mg p.o. twice daily, patient [facility], I advised her daughter to make appointment and [name].  MRI of the brain is done to  evaluate for recurrent strokes.  Patient received a Vimpat 200 mg IV 1 time.  2.  Essential hypertension,; BP running softer: Replaced. , I adjusted the losartan to 50 mg twice daily, metoprolol 25 mg twice daily and discontinue his HCTZ. 3.  Hypokalemia: Replaced. #4 paroxysmal atrial fibrillation: Patient is on apixaban 5 mg p.o. twice daily History of previous strokes, patient on  statins. History of depression: Continue  Zoloft. Deconditioning: Physical therapy recommended skilled nursing, patient family chose [facility].  I called patient's daughter again explaining that insurance authorized her to go to [facility] today. Discharge Condition: Stable   Follow UP   Contact information  for follow-up providers    [name], MD. Schedule an appointment as soon as possible for a visit in 1 week(s).   Specialty:  Internal Medicine Contact information: [address] [place] [address] [phone]            Contact information for after-discharge care    Destination    [facility] .   Service:  Skilled Nursing Contact information: [address] [place] [address] [phone]                    Discharge Instructions  and  Discharge Medications      Allergies as of 02/23/2018   No Known Allergies     Medication List    STOP taking these medications   losartan-hydrochlorothiazide 100-25 MG tablet Commonly known as:  HYZAAR     TAKE these medications   amLODipine 2.5 MG tablet Commonly known as:  NORVASC Take 1 tablet (2.5 mg total) by mouth daily.   atorvastatin 20 MG tablet Commonly known as:  LIPITOR Take 1 tablet (20 mg total) by mouth daily at 6 PM.   baclofen 10 MG tablet Commonly known as:  LIORESAL Take 10 mg by mouth 3 (three) times daily. 09/08/16 as needed   ELIQUIS 5 MG Tabs tablet Generic drug:  apixaban Take 5 mg by mouth 2 (two) times daily.   gabapentin 300 MG capsule Commonly known as:  NEURONTIN Take 300 mg by mouth 3 (three) times daily.   HYDROcodone-acetaminophen 5-325 MG tablet Commonly known as:  NORCO/VICODIN Take 2 tablets by mouth every 6 (six) hours as needed.   lacosamide 50 MG Tabs tablet Commonly known as:  VIMPAT Take 1 tablet (50 mg total) by mouth 2 (two) times daily.   lidocaine 5 % Commonly known as:  LIDODERM Place 1 patch onto the skin daily as needed (pain). Remove & Discard patch within 12 hours or as directed by MD   losartan 50 MG tablet Commonly known as:  COZAAR Take 1 tablet (50 mg total) by mouth daily.   metoprolol tartrate 25 MG tablet Commonly known as:  LOPRESSOR Take 1 tablet (25 mg total) by mouth 2 (two) times  daily. What changed:    medication strength  how much to take   sertraline 50 MG tablet Commonly known as:  ZOLOFT Take 1 tablet (50 mg total) by mouth at bedtime.         Diet and Activity recommendation: See Discharge Instructions above   Consults obtained -neurology, physical therapy   Major procedures and Radiology Reports - PLEASE review detailed and final reports for all details, in brief -      Dg Chest 1 View  Result Date: 02/20/2018 CLINICAL DATA:  Altered mental status and vomiting EXAM: CHEST  1 VIEW COMPARISON:  None. FINDINGS: No edema or consolidation. There is mild cardiac enlargement. The pulmonary vascularity is normal. No adenopathy. There is aortic atherosclerosis. No bone lesions. IMPRESSION: Mild cardiac enlargement. No edema or consolidation. There is aortic atherosclerosis. Aortic Atherosclerosis (ICD10-I70.0). Electronically Signed   By: Bretta Bang III M.D.   On: 02/20/2018 09:43   Ct Head Wo Contrast  Result Date: 02/20/2018 CLINICAL DATA:  Altered mental status and vomiting EXAM: CT  HEAD WITHOUT CONTRAST TECHNIQUE: Contiguous axial images were obtained from the base of the skull through the vertex without intravenous contrast. COMPARISON:  Head CT July 22, 2017 and brain MRI July 22, 2017 FINDINGS: Brain: Mild to moderate diffuse atrophy is stable. There is evidence of a prior infarct involving portions of the right mid to posterior frontal and anterior parietal lobes as well as a portion of the superior right temporal lobe consistent with a sizable right middle cerebral artery distribution infarct, stable. Elsewhere there is patchy small vessel disease in the centra semiovale bilaterally. There is also small vessel disease in the right internal capsule. There is decreased attenuation in the mid right thalamus consistent with a small prior lacunar infarct. There is no intracranial mass, hemorrhage, extra-axial fluid collection, or midline  shift. No acute infarct is demonstrable on this study. Vascular: There is no appreciable hyperdense vessel. There is calcification in each carotid siphon. Skull: Bony calvarium appears intact. Sinuses/Orbits: There is mild mucosal thickening in several ethmoid air cells. Other visualized paranasal sinuses are clear. Visualized orbits appear symmetric bilaterally. Other: There is opacification with air-fluid level in the inferior left mastoid air cell region. Mastoid air cells elsewhere appear normal. IMPRESSION: 1. Prior infarct involving portions of the right mid to posterior frontal, anterior parietal, and superior right temporal lobes, stable. There is stable atrophy with mild patchy periventricular small vessel disease. Small vessel disease also noted in the right internal capsule and right thalamus. No acute infarct demonstrable. No mass or hemorrhage. 2.  Foci of arterial vascular calcification noted. 3.  Areas of ethmoid sinus mucosal thickening. 4. Inferior left mastoid air cell disease with air-fluid level in the inferior left mastoid region. Electronically Signed   By: Bretta Bang III M.D.   On: 02/20/2018 10:01   Mr Brain Wo Contrast  Result Date: 02/21/2018 CLINICAL DATA:  Acute presentation with altered mental status. Possible seizures. EXAM: MRI HEAD WITHOUT CONTRAST TECHNIQUE: Multiplanar, multiecho pulse sequences of the brain and surrounding structures were obtained without intravenous contrast. COMPARISON:  CT 02/20/2018.  MRI 07/22/2017. FINDINGS: Brain: Diffusion imaging does not show any acute or subacute infarction or other cause of restricted diffusion. Chronic small vessel ischemic changes affect the pons. No focal cerebellar insult. Old infarction in the right middle cerebral artery territory affecting the deep insula and frontoparietal brain. Atrophy, encephalomalacia and gliosis in that region. Mild small vessel change elsewhere affecting the hemispheric white matter. No sign of  acute hemorrhage, mass lesion, hydrocephalus or extra-axial collection. Vascular: Major vessels at the base of the brain show flow. Skull and upper cervical spine: Negative Sinuses/Orbits: Clear/normal Other: None IMPRESSION: No acute finding. 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 Electronically Signed   By: Paulina Fusi M.D.   On: 02/21/2018 10:54    Micro Results     No results found for this or any previous visit (from the past 240 hour(s)).     Today   Subjective:   [facility] today stable for discharge.  Objective:   Blood pressure 140/76, pulse (!) 51, temperature 98 F (36.7 C), temperature source Oral, resp. rate 20, height 5\' 4"  (1.626 m), weight 94.3 kg (207 lb 14.3 oz), SpO2 99 %.   Intake/Output Summary (Last 24 hours) at 02/23/2018 1058 Last data filed at 02/22/2018 1800 Gross per 24 hour  Intake 480 ml  Output -  Net 480 ml    Exam Awake Alert, Oriented x 3,ts, Normal affect [place].AT,PERRAL,  patient baseline dysarthria but alert, weakness on the left side due to previous stroke. Supple Neck,No JVD, No cervical lymphadenopathy appriciated.  Symmetrical Chest wall movement, Good air movement bilaterally, CTAB RRR,No Gallops,Rubs or new Murmurs,  +ve B.Sounds, Abd Soft, Non tender, No organomegaly appriciated, No rebound -guarding or rigidity. No Cyanosis, Clubbing or edema, No new Rash or bruise  Data Review   CBC w Diff:  Lab Results  Component Value Date   WBC 6.8 02/21/2018   HGB 10.1 (L) 02/21/2018   HGB 10.8 (L) 05/12/2017   HCT 30.6 (L) 02/21/2018   HCT 33.4 (L) 05/12/2017   PLT 187 02/21/2018   PLT 234 05/12/2017   LYMPHOPCT 20 02/20/2018   MONOPCT 5 02/20/2018   EOSPCT 1 02/20/2018   BASOPCT 1 02/20/2018    CMP:  Lab Results  Component Value Date   NA 140 02/21/2018   K 3.5 02/21/2018   CL 106 02/21/2018   CO2 26 02/21/2018   BUN 25 (H) 02/21/2018   CREATININE 0.89 02/21/2018   PROT 8.6  (H) 02/20/2018   ALBUMIN 4.3 02/20/2018   BILITOT 0.5 02/20/2018   ALKPHOS 104 02/20/2018   AST 28 02/20/2018   ALT 15 02/20/2018  .   Total Time in preparing paper work, data evaluation and todays exam - 35 minutes  Katha Hamming M.D on 02/23/2018 at 10:58 AM    Note: This dictation was prepared with Dragon dictation along with smaller phrase technology. Any transcriptional errors that result from this process are unintentional.

## 2018-02-23 NOTE — Clinical Social Work Placement (Signed)
   CLINICAL SOCIAL WORK PLACEMENT  NOTE  Date:  02/23/2018  Patient Details  Name: [name] MRN: [id number] Date of Birth: [birth year]  Clinical Social Work is seeking post-discharge placement for this patient at the Skilled  Nursing Facility level of care (*CSW will initial, date and re-position this form in  chart as items are completed):  Yes   Patient/family provided with [facility] Clinical Social Work Department's list of facilities offering this level of care within the geographic area requested by the patient (or if unable, by the patient's family).  Yes   Patient/family informed of their freedom to choose among providers that offer the needed level of care, that participate in Medicare, Medicaid or managed care program needed by the patient, have an available bed and are willing to accept the patient.  Yes   Patient/family informed of [facility]'s ownership interest in [facility] and [facility], as well as of the fact that they are under no obligation to receive care at these facilities.  PASRR submitted to EDS on 02/22/18     PASRR number received on 02/22/18     Existing PASRR number confirmed on       FL2 transmitted to all facilities in geographic area requested by pt/family on 02/22/18     FL2 transmitted to all facilities within larger geographic area on       Patient informed that his/her managed care company has contracts with or will negotiate with certain facilities, including the following:        Yes   Patient/family informed of bed offers received.  Patient [facility] bed at [facility]     Physician recommends and patient chooses bed at [facility](SNF)    Patient to be transferred to   on 02/23/18.  Patient to be transferred to facility by (EMS)     Patient family notified on 02/23/18 of transfer.  Name of family member notified:  (Ms. Valda FaviaGarris; patient's daughter)     PHYSICIAN       Additional Comment:     _______________________________________________ [place] SpanielMonica Tajee Savant, LCSW 02/23/2018, 7:43 PM

## 2018-02-23 NOTE — Clinical Social Work Note (Addendum)
CSW has received authorization [facility] [employer] [facility]. [facility] discharged today. [facility] was aware and received discharge information. Patient and her daughter are in agreement. York SpanielMonica Hunter Pinkard MSW,LCSW

## 2018-02-23 NOTE — Progress Notes (Signed)
Physical Therapy Treatment Patient Details Name: [facility] MRN: [id number] DOB: 1941/08/28 Today's Date: 02/23/2018    History of Present Illness Pt admitted for seizures. Pt with complaints of AMS. History includes CVA, afib, HTN, and GERD.     PT Comments    Participated in exercises as described below.  Session focused on ROM for BLE's and for bed mobility skills to increase strength and to teach/reenforce skills to assist with care.  She was able to sit EOB x 7 minutes with min assist once up.  HOB was raised fully to assist with transition.  During care, she was incontinent of bowl and bladder and was cleaned as appropriate with nurse tech to assist.   Follow Up Recommendations  SNF     Equipment Recommendations       Recommendations for Other Services       Precautions / Restrictions Precautions Precautions: Fall Restrictions Weight Bearing Restrictions: No    Mobility  Bed Mobility Overal bed mobility: Needs Assistance Bed Mobility: Rolling Rolling: Max assist         General bed mobility comments: needed max assist for rolling towards L side, able to reach with R arm, however unable to fully transition to side. Unable to reach or attempt rolling towards R side without total assistance.  Transfers                 General transfer comment: unable to attempt due to weakness  Ambulation/Gait             General Gait Details: non amb at baseline   Stairs             Wheelchair Mobility    Modified Rankin (Stroke Patients Only)       Balance Overall balance assessment: Needs assistance Sitting-balance support: Feet supported;Single extremity supported Sitting balance-Leahy Scale: Poor         Standing balance comment: not tested                            Cognition Arousal/Alertness: Awake/alert Behavior During Therapy: WFL for tasks assessed/performed Overall Cognitive Status: History of cognitive impairments -  at baseline                                        Exercises Other Exercises Other Exercises: sitting edge of bed x 7 minutes with min assist. Other Exercises: BLE AA/PROM x 10 for ankle pumps, SLR heel slides, ab/add and SAQ. Other Exercises: rolling to left x 10 with emphasis on pt assist with rigth arm and rolling hips fully over - requires asisst. Other Exercises: bed change and inc care due to urine and BM incontinence with nurse tech to assist.    General Comments        Pertinent Vitals/Pain Pain Assessment: No/denies pain    Home Living                      Prior Function            PT Goals (current goals can now be found in the care plan section) Progress towards PT goals: Progressing toward goals    Frequency    Min 2X/week      PT Plan Current plan remains appropriate    Co-evaluation  AM-PAC PT "6 Clicks" Daily Activity  Outcome Measure  Difficulty turning over in bed (including adjusting bedclothes, sheets and blankets)?: Unable Difficulty moving from lying on back to sitting on the side of the bed? : Unable Difficulty sitting down on and standing up from a chair with arms (e.g., wheelchair, bedside commode, etc,.)?: Unable Help needed moving to and from a bed to chair (including a wheelchair)?: Total Help needed walking in hospital room?: Total Help needed climbing 3-5 steps with a railing? : Total 6 Click Score: 6    End of Session   Activity Tolerance: Patient tolerated treatment well Patient left: in bed;with bed alarm set Nurse Communication: Mobility status Hemiplegia - Right/Left: Left Hemiplegia - dominant/non-dominant: Non-dominant Hemiplegia - caused by: Cerebral infarction     Time: [id number] PT Time Calculation (min) (ACUTE ONLY): 41 min  Charges:  $Therapeutic Exercise: 8-22 mins $Therapeutic Activity: 8-22 mins                    G Codes:   Danielle DessSarah Kiyara Bouffard, PTA 02/23/18, 11:15  AM

## 2018-02-23 NOTE — Evaluation (Signed)
Occupational Therapy Evaluation Patient Details Name: Diana Bush MRN: [id number] DOB: August 30, 1941 Today's Date: 02/23/2018    History of Present Illness 77yo female Pt admitted for seizures. Pt with complaints of AMS. History includes CVA, afib, HTN, and GERD.    Clinical Impression   Pt seen for OT evaluation this date. Prior to hospital admission, pt was requiring assist for ADL tasks from caregivers/family and has been able to transfer from w/c until recently requiring pt's family to use hoyer lift for all mobility.  Currently pt demonstrates increased tone, decreased ROM, and no detectable muscle activity in L hand with noted worsening of contracture, per pt's dtr. Pt able to self feed with supervision, set up and omelet cut for her, and with verbal cues to slow down and take small bites prior to taking another in order to minimize aspiration risk. OT provided gentle strength and passive ROM to L hand to improve PROM and prevent further contracture. Placed a rolled towel in palm to provide passive stretch and maintain more open PROM. Pt will benefit from skilled OT to address noted impairments and functional limitations (see below for any additional details).  Upon hospital discharge, recommend pt discharge to STR. Will further assess for splinting needs.    Follow Up Recommendations  SNF    Equipment Recommendations  Other (comment)(TBD at next venue of care - may require resting hand splint to prevent further contracture)    Recommendations for Other Services       Precautions / Restrictions Precautions Precautions: Fall Restrictions Weight Bearing Restrictions: No      Mobility Bed Mobility Overal bed mobility: Needs Assistance Bed Mobility: Rolling Rolling: Max assist          Transfers                     Balance Overall balance assessment: Needs assistance Sitting-balance support: Feet supported;Single extremity supported Sitting balance-Leahy Scale:  Poor         Standing balance comment: not tested                           ADL either performed or assessed with clinical judgement   ADL Overall ADL's : Needs assistance/impaired Eating/Feeding: Bed level;Supervision/ safety;Set up;Minimal assistance;Cueing for safety Eating/Feeding Details (indicate cue type and reason): moderate spillage, verbal cues to chew small bites and swallow before taking another bite to minimize risk of aspiration, mild coughing noted afterwards Grooming: Bed level;Moderate assistance   Upper Body Bathing: Bed level;Maximal assistance   Lower Body Bathing: Bed level;Total assistance   Upper Body Dressing : Bed level;Maximal assistance   Lower Body Dressing: Bed level;Total assistance     Toilet Transfer Details (indicate cue type and reason): unable, hoyer lift t/f Toileting- Clothing Manipulation and Hygiene: Total assistance               Vision Patient Visual Report: No change from baseline       Perception     Praxis      Pertinent Vitals/Pain Pain Assessment: No/denies pain     Hand Dominance Right   Extremity/Trunk Assessment Upper Extremity Assessment Upper Extremity Assessment: RUE deficits/detail;LUE deficits/detail RUE Deficits / Details: grossly 4/5 LUE Deficits / Details: Previously impaired strength, AROM, and tone from previous stroke, appears to have worsened. Trace muscle activation more proximal, hand fisted, no functional use. Dtr notes L hand is more fisted than previously (had been working with OT on this)  LUE Sensation: decreased light touch LUE Coordination: decreased gross motor;decreased fine motor   Lower Extremity Assessment Lower Extremity Assessment: Defer to PT evaluation;LLE deficits/detail;Generalized weakness   Cervical / Trunk Assessment Cervical / Trunk Assessment: Normal   Communication Communication Communication: Expressive difficulties   Cognition Arousal/Alertness:  Awake/alert Behavior During Therapy: WFL for tasks assessed/performed Overall Cognitive Status: History of cognitive impairments - at baseline                                     General Comments       Exercises    Shoulder Instructions      Home Living Family/patient expects to be discharged to:: Skilled nursing facility Living Arrangements: Children;Other relatives Available Help at Discharge: Family;Available 24 hours/day Type of Home: House Home Access: Level entry     Home Layout: One level               Home Equipment: Walker - 2 wheels;Wheelchair - manual;Hospital bed;Other (comment)(hoyer lift)          Prior Functioning/Environment Level of Independence: Needs assistance  Gait / Transfers Assistance Needed: previously able to t/f to/from w/c using RW, but hasn't been able to ambulate in a year. Now recently transfering only with hoyer lift  ADL's / Homemaking Assistance Needed: assist for all ADL tasks, able to self feed with set up             OT Problem List: Decreased strength;Decreased knowledge of use of DME or AE;Impaired tone;Decreased range of motion;Decreased coordination;Impaired UE functional use;Impaired sensation;Decreased safety awareness;Impaired balance (sitting and/or standing);Decreased cognition;Decreased activity tolerance;Obesity      OT Treatment/Interventions: Self-care/ADL training;Balance training;Therapeutic exercise;Splinting;Therapeutic activities;DME and/or AE instruction;Patient/family education    OT Goals(Current goals can be found in the care plan section) Acute Rehab OT Goals Patient Stated Goal: to stronger and go home OT Goal Formulation: With patient/family Time For Goal Achievement: 03/09/18 Potential to Achieve Goals: Good ADL Goals Pt Will Perform Eating: with set-up;with supervision;bed level Additional ADL Goal #1: Pt will tolerate >2 hours of resting hand splint on L hand to prevent permanent  contracture or skin breakdown and maximize optimal positioning for functional use during self care tasks.  OT Frequency: Min 1X/week   Barriers to D/C:            Co-evaluation              AM-PAC PT "6 Clicks" Daily Activity     Outcome Measure Help from another person eating meals?: A Little Help from another person taking care of personal grooming?: A Little Help from another person toileting, which includes using toliet, bedpan, or urinal?: Total Help from another person bathing (including washing, rinsing, drying)?: A Lot Help from another person to put on and taking off regular upper body clothing?: A Lot Help from another person to put on and taking off regular lower body clothing?: Total 6 Click Score: 12   End of Session    Activity Tolerance: Patient tolerated treatment well Patient left: in bed;with call bell/phone within reach;with bed alarm set;with family/visitor present;Other (comment)(rolled washcloth in L hand)  OT Visit Diagnosis: Other abnormalities of gait and mobility (R26.89);Muscle weakness (generalized) (M62.81);Hemiplegia and hemiparesis;Feeding difficulties (R63.3) Hemiplegia - Right/Left: Left Hemiplegia - dominant/non-dominant: Non-Dominant Hemiplegia - caused by: Cerebral infarction                Time: 1610-9604 OT  Time Calculation (min): 27 min Charges:  OT General Charges $OT Visit: 1 Visit OT Evaluation $OT Eval High Complexity: 1 High OT Treatments $Therapeutic Exercise: 8-22 mins  Richrd PrimeJamie Stiller, MPH, MS, OTR/L ascom [phone] 02/23/18, 11:35 AM

## 2018-02-23 NOTE — Progress Notes (Signed)
Telephone call to 911 for nonemergency transport to [facility]; pt is the "next one up on the list"

## 2018-02-23 NOTE — Progress Notes (Signed)
Subjective: Patient appears at baseline.  No further seizures noted.  Objective: Current vital signs: BP 140/76 (BP Location: Left Arm)   Pulse (!) 51   Temp 98 F (36.7 C) (Oral)   Resp 20   Ht 5\' 4"  (1.626 m)   Wt 94.3 kg (207 lb 14.3 oz)   SpO2 99%   BMI 35.68 kg/m  Vital signs in last 24 hours: Temp:  [98 F (36.7 C)] 98 F (36.7 C) (04/18 0503) Pulse Rate:  [51-66] 51 (04/18 0503) BP: (126-140)/(71-76) 140/76 (04/18 0503) SpO2:  [98 %-99 %] 99 % (04/18 0503) Weight:  [94.3 kg (207 lb 14.3 oz)] 94.3 kg (207 lb 14.3 oz) (04/18 0503)  Intake/Output from previous day: 04/17 0701 - 04/18 0700 In: 480 [P.O.:480] Out: -  Intake/Output this shift: No intake/output data recorded. Nutritional status: Seizure precautions DIET DYS 3 Room service appropriate? Yes with Assist; Fluid consistency: Thin Aspiration precautions  Neurologic Exam: Mental Status: Awake and alert. Feeding self breakfast. Speech noted but nonfluent.  Followscommands. Cranial Nerves: II: patient does not respond confrontationfrom the left, pupils right333mm, left 704mm,and reactivebilaterally III,IV,VI:EOM grossly intact V,VII: leftfacial droop VIII:grossly intact IX,X: gag reflexreduced, XI: trapezius strength unable to test bilaterally XII: tonguemidline Motor: Moves RUE spontaneously against gravity. Left upper extremity with increased tone and fisted handat 0/5.    Lab Results: Basic Metabolic Panel: Recent Labs  Lab 02/20/18 0926 02/21/18 0528  NA 140 140  K 2.9* 3.5  CL 101 106  CO2 28 26  GLUCOSE 135* 107*  BUN 32* 25*  CREATININE 1.11* 0.89  CALCIUM 9.4 9.2    Liver Function Tests: Recent Labs  Lab 02/20/18 0926  AST 28  ALT 15  ALKPHOS 104  BILITOT 0.5  PROT 8.6*  ALBUMIN 4.3   Recent Labs  Lab 02/20/18 0926  LIPASE 36   No results for input(s): AMMONIA in the last 168 hours.  CBC: Recent Labs  Lab 02/20/18 0926 02/21/18 0528  WBC 10.6 6.8   NEUTROABS 7.8*  --   HGB 11.6* 10.1*  HCT 35.7 30.6*  MCV 85.8 84.3  PLT 196 187    Cardiac Enzymes: Recent Labs  Lab 02/20/18 0926  TROPONINI <0.03    Lipid Panel: No results for input(s): CHOL, TRIG, HDL, CHOLHDL, VLDL, LDLCALC in the last 168 hours.  CBG: Recent Labs  Lab 02/20/18 0921  GLUCAP 105*    Microbiology: Results for orders placed or performed during the hospital encounter of 07/28/16  MRSA PCR Screening     Status: None   Collection Time: 07/29/16  6:06 AM  Result Value Ref Range Status   MRSA by PCR NEGATIVE NEGATIVE Final    Comment:        The GeneXpert MRSA Assay (FDA approved for NASAL specimens only), is one component of a comprehensive MRSA colonization surveillance program. It is not intended to diagnose MRSA infection nor to guide or monitor treatment for MRSA infections.     Coagulation Studies: No results for input(s): LABPROT, INR in the last 72 hours.  Imaging: No results found.  Medications:  I have reviewed the patient's current medications. Scheduled: . apixaban  5 mg Oral BID  . atorvastatin  20 mg Oral q1800  . gabapentin  300 mg Oral TID  . lacosamide  50 mg Oral BID  . losartan  100 mg Oral Daily  . metoprolol tartrate  50 mg Oral BID  . sodium chloride flush  3 mL Intravenous Q12H  Assessment/Plan: Patient at baseline.  No further seizures noted. Tolerating Vimpat.  Daughter's questions addressed.   No further neurologic intervention is recommended at this time.  If further questions arise, please call or page at that time.  Thank you for allowing neurology to participate in the care of this patient.    LOS: 3 days   Thana Farr, MD Neurology [phone] 02/23/2018  12:29 PM

## 2018-02-23 NOTE — Progress Notes (Signed)
EMS present for pt discharge; pt discharged via stretcher by EMS to [facility]

## 2018-04-18 ENCOUNTER — Encounter: Payer: Self-pay | Admitting: Diagnostic Neuroimaging

## 2018-04-18 ENCOUNTER — Ambulatory Visit (INDEPENDENT_AMBULATORY_CARE_PROVIDER_SITE_OTHER): Payer: Medicare HMO | Admitting: Diagnostic Neuroimaging

## 2018-04-18 ENCOUNTER — Encounter

## 2018-04-18 VITALS — BP 158/78 | HR 55 | Ht 65.0 in

## 2018-04-18 DIAGNOSIS — I63311 Cerebral infarction due to thrombosis of right middle cerebral artery: Secondary | ICD-10-CM | POA: Diagnosis not present

## 2018-04-18 DIAGNOSIS — R569 Unspecified convulsions: Secondary | ICD-10-CM | POA: Diagnosis not present

## 2018-04-18 DIAGNOSIS — F329 Major depressive disorder, single episode, unspecified: Secondary | ICD-10-CM | POA: Diagnosis not present

## 2018-04-18 DIAGNOSIS — I69398 Other sequelae of cerebral infarction: Secondary | ICD-10-CM

## 2018-04-18 DIAGNOSIS — F32A Depression, unspecified: Secondary | ICD-10-CM

## 2018-04-18 MED ORDER — SERTRALINE HCL 100 MG PO TABS: 100.0000 mg | ORAL_TABLET | Freq: Every day | ORAL | 12 refills | Status: DC

## 2018-04-18 MED ORDER — GABAPENTIN 300 MG PO CAPS
600.0000 mg | ORAL_CAPSULE | Freq: Three times a day (TID) | ORAL | 12 refills | Status: DC
Start: 2018-04-18 — End: 2019-04-30

## 2018-04-18 NOTE — Progress Notes (Signed)
[facility]  PATIENT: Diana Bush DOB: 1940-12-19  REFERRING CLINICIAN: Xu HISTORY FROM: patient, daughter, grandson REASON FOR VISIT: new consult / existing patient   HISTORICAL  CHIEF COMPLAINT:  Chief Complaint  Patient presents with  . Seizures    rm 6, ED referral, dgtr- [name], grandsonShari Prows, "had mult seizures starting in April, none since being d/c from rehab; off Vimpat x 2 weeks- too expensive"; speech and aphasia are a lot worse- getting PT, ST  therapy at home now     HISTORY OF PRESENT ILLNESS:   UPDATE (04/18/18, VRP): Since last visit, had hosp eval in Apr 2019 for AMS and possible seizure. Was d/c on vimpat, but then stopped a few weeks ago due to significant cost. No further seizures. More depression, confusion, paranoia, aggressive behavior (which started after the stroke in 2017). No other alleviating or aggravating factors.   PRIOR HPI (09/08/16): 77 year old female with history of hypertension, here for evaluation of stroke. 07/28/16 patient had sudden onset of left hemiplegia, speech and language difficulty and right gaze preference. Patient was brought to the hospital and code stroke was activated. IV TPA was given. Patient then went to interventional radiology and had near complete revascularization of occluded right superior branch of middle cerebral artery (with Solitaire device and intra-arterial Integrilin). Patient then went to neuro ICU for further evaluation. Stroke workup was completed. Patient was diagnosed with atrial fibrillation and treated with eliquis anticoagulation. Patient was discharged to skilled nursing facility and currently is residing at [facility] rehabilitation.  Since that time patient has had increasing pain and swelling in the left upper extremity. Ultrasound was obtained which apparently showed a deep vein thrombosis. Patient had been on eliquis, and therefore no change in management was made.  Patient's daughter  is concerned and confused about management, prognosis and treatment options of stroke, blood clot in left arm, neck pain, arm pain, language difficulty.  Also there is report of increasing facial twisting and worsening language function per family.     REVIEW OF SYSTEMS: Full 14 system review of systems performed and negative with exception of: speech diff behavior problem leg swelling depression anxiety.   ALLERGIES: No Known Allergies  HOME MEDICATIONS: Outpatient Medications Prior to Visit  Medication Sig Dispense Refill  . amLODipine (NORVASC) 2.5 MG tablet Take 1 tablet (2.5 mg total) by mouth daily. 30 tablet 11  . apixaban (ELIQUIS) 5 MG TABS tablet Take 5 mg by mouth 2 (two) times daily.    Marland Kitchen atorvastatin (LIPITOR) 20 MG tablet Take 1 tablet (20 mg total) by mouth daily at 6 PM. 30 tablet 2  . baclofen (LIORESAL) 10 MG tablet Take 1 tablet (10 mg total) by mouth 3 (three) times daily. 09/08/16 as needed 30 each 0  . cetirizine (ZYRTEC) 10 MG tablet Take 10 mg by mouth daily as needed.  5  . furosemide (LASIX) 20 MG tablet TAKE ONE TABLET BY MOUTH DAILY AS NEEDED FOR LEFT ARM/LEG SWELLING  2  . gabapentin (NEURONTIN) 300 MG capsule Take 1 capsule (300 mg total) by mouth 3 (three) times daily. 30 capsule 0  . lidocaine (LIDODERM) 5 % Place 1 patch onto the skin daily as needed (pain). Remove & Discard patch within 12 hours or as directed by MD     . losartan (COZAAR) 50 MG tablet Take 1 tablet (50 mg total) by mouth daily. 30 tablet 1  . metoprolol tartrate (LOPRESSOR) 25 MG tablet Take 1 tablet (25 mg  total) by mouth 2 (two) times daily. 60 tablet 0  . ranitidine (ZANTAC) 150 MG tablet Take 150 mg by mouth 2 (two) times daily.  2  . sertraline (ZOLOFT) 50 MG tablet Take 1 tablet (50 mg total) by mouth at bedtime. 30 tablet 2  . lacosamide (VIMPAT) 50 MG TABS tablet Take 1 tablet (50 mg total) by mouth 2 (two) times daily. (Patient not taking: Reported on 04/18/2018) 60 tablet 0  .  metoprolol tartrate (LOPRESSOR) 50 MG tablet Take 1 tablet (50 mg total) by mouth 2 (two) times daily. 30 tablet 0   No facility-administered medications prior to visit.     PAST MEDICAL HISTORY: Past Medical History:  Diagnosis Date  . Borderline hyperlipidemia   . Difficulty sleeping    DUE TO PAIN  . GERD (gastroesophageal reflux disease)   . Hypertension   . Neuromuscular disorder (HCC)   . Osteoarthritis   . PAF (paroxysmal atrial fibrillation) (HCC)   . Seizures (HCC)   . Stroke [facility](HCC) 07/2016    PAST SURGICAL HISTORY: Past Surgical History:  Procedure Laterality Date  . IR GENERIC HISTORICAL  07/28/2016   IR PERCUTANEOUS ART THROMBECTOMY/INFUSION INTRACRANIAL INC DIAG ANGIO 07/28/2016 [name], MD [facility]-INTERV RAD  . LYMPH NODE BIOPSY     BENIGN  . RADIOLOGY WITH ANESTHESIA N/A 07/28/2016   Procedure: RADIOLOGY WITH ANESTHESIA;  Surgeon: [name], MD;  Location: [facility] OR;  Service: Radiology;  Laterality: N/A;  . TOTAL HIP ARTHROPLASTY Right 08/22/2015   Procedure: RIGHT TOTAL HIP ARTHROPLASTY ANTERIOR APPROACH;  Surgeon: Kathryne Hitchhristopher Y Blackman, MD;  Location: [facility] ORS;  Service: Orthopedics;  Laterality: Right;    FAMILY HISTORY: Family History  Problem Relation Age of Onset  . Cancer Mother   . Alzheimer's disease Mother   . Heart disease Neg Hx   . Diabetes Neg Hx   . Stroke Neg Hx     SOCIAL HISTORY:  Social History   Socioeconomic History  . Marital status: Widowed    Spouse name: Not on file  . Number of children: 5  . Years of education: 6412  . Highest education level: Not on file  Occupational History    Comment: retired, caregiver for husband  Social Needs  . Financial resource strain: Not on file  . Food insecurity:    Worry: Not on file    Inability: Not on file  . Transportation needs:    Medical: Not on file    Non-medical: Not on file  Tobacco Use  . Smoking status: Never Smoker  . Smokeless tobacco: Never Used  Substance and  Sexual Activity  . Alcohol use: No  . Drug use: No  . Sexual activity: Not on file  Lifestyle  . Physical activity:    Days per week: Not on file    Minutes per session: Not on file  . Stress: Not on file  Relationships  . Social connections:    Talks on phone: Not on file    Gets together: Not on file    Attends religious service: Not on file    Active member of club or organization: Not on file    Attends meetings of clubs or organizations: Not on file    Relationship status: Not on file  . Intimate partner violence:    Fear of current or ex partner: Not on file    Emotionally abused: Not on file    Physically abused: Not on file    Forced sexual activity: Not  on file  Other Topics Concern  . Not on file  Social History Narrative   ** Merged History Encounter **    09/08/16 currently at [facility], lives with husband    04/18/18 husband passed in Nov 2018, patient living with daughter Joselyn Arrow     PHYSICAL EXAM  GENERAL EXAM/CONSTITUTIONAL: Vitals:  Vitals:   04/18/18 1030  BP: (!) 158/78  Pulse: (!) 55  Height: 5\' 5"  (1.651 m)   Body mass index is 34.6 kg/m. No exam data present  Patient is in no distress; well developed, nourished and groomed; neck is supple  CARDIOVASCULAR:  Examination of carotid arteries is normal; no carotid bruits  Regular rate and rhythm, no murmurs  Examination of peripheral vascular system by observation and palpation is normal  EYES:  Ophthalmoscopic exam of optic discs and posterior segments is normal; no papilledema or hemorrhages  MUSCULOSKELETAL:  Gait, strength, tone, movements noted in Neurologic exam below  NEUROLOGIC: MENTAL STATUS:  No flowsheet data found.   awake, alert, oriented to person; NOT PLACE OR TIME  DECR memory  DECR attention and concentration  MODERATE-SEVERE EXPRESSIVE APHASIA  MILD COMPREHENSION DIFFICULTY  MODERATE DYSARTHRIA  DECR NAMING  ABLE TO REPEAT  SIMPLE  DECR fund of knowledge   CRANIAL NERVE:   2nd - no papilledema on fundoscopic exam  2nd, 3rd, 4th, 6th - pupils equal and reactive to light, DECR LEFT VISUAL FIELD; LEFT NEGLECT; RIGHT GAZE PREFERENCE; extraocular muscles intact, no nystagmus  5th - facial sensation DECR ON LEFT  7th - facial strength --> DECR ON LEFT   8th - hearing intact  9th - palate elevates symmetrically, uvula midline  11th - shoulder shrug symmetric  12th - tongue protrusion midline  MOTOR:   normal bulk and tone IN RUE  RLE 3-4 WITH NORMAL TONE  LUE FLACCID HEMIPLEGIA; SWOLLEN, WARN, SLIGHTLY TENDER  LLE FLACCID HEMIPARESIS (1-2/5)  SENSORY:   LEFT NEGLECT  DECR SENSATION IN LUE AND LLE  COORDINATION:   finger-nose-finger, fine finger movements LIMITED ON LEFT SIDE  REFLEXES:   deep tendon reflexes SLIGHTLY BRISK IN LUE  GAIT/STATION:   IN WHEEL CHAIR    DIAGNOSTIC DATA (LABS, IMAGING, TESTING) - I reviewed patient records, labs, notes, testing and imaging myself where available.  Lab Results  Component Value Date   WBC 6.8 02/21/2018   HGB 10.1 (L) 02/21/2018   HCT 30.6 (L) 02/21/2018   MCV 84.3 02/21/2018   PLT 187 02/21/2018      Component Value Date/Time   NA 140 02/21/2018 0528   K 3.5 02/21/2018 0528   CL 106 02/21/2018 0528   CO2 26 02/21/2018 0528   GLUCOSE 107 (H) 02/21/2018 0528   BUN 25 (H) 02/21/2018 0528   CREATININE 0.89 02/21/2018 0528   CALCIUM 9.2 02/21/2018 0528   PROT 8.6 (H) 02/20/2018 0926   ALBUMIN 4.3 02/20/2018 0926   AST 28 02/20/2018 0926   ALT 15 02/20/2018 0926   ALKPHOS 104 02/20/2018 0926   BILITOT 0.5 02/20/2018 0926   GFRNONAA >60 02/21/2018 0528   GFRAA >60 02/21/2018 0528   Lab Results  Component Value Date   CHOL 143 07/29/2016   HDL 43 07/29/2016   LDLCALC 90 07/29/2016   TRIG 69 07/31/2016   CHOLHDL 3.3 07/29/2016   Lab Results  Component Value Date   HGBA1C 5.6 07/29/2016   No results found for:  VITAMINB12 No results found for: TSH  07/28/16 CT head [I reviewed images  myself and agree with interpretation. -VRP]  1. Acute RIGHT MCA territory nonhemorrhagic infarct with dense RIGHT MCA. Otherwise negative CT HEAD for age. 2. ASPECTS is 8.  07/29/16 CT head [I reviewed images myself and agree with interpretation. -VRP]  1. No complication identified status post recent catheter directed therapy for right MCA embolism. 2. Evolving hypodensity within the right insular cortex, compatible with evolving ischemia.  07/29/16 MRI brain [I reviewed images myself and agree with interpretation. -VRP]  - Significant progression of acute infarction compared with the preoperative code stroke CT, now with involvement of the insula, posterior frontal and anterior parietal cortex and regional white matter. No visible hemorrhagic transformation.  07/30/16 TTE - Left ventricle: The cavity size was normal. There was moderate   concentric hypertrophy. Systolic function was vigorous. The   estimated ejection fraction was in the range of 65% to 70%. Wall   motion was normal; there were no regional wall motion   abnormalities. The study was not technically sufficient to allow   evaluation of LV diastolic dysfunction due to atrial   fibrillation. - Aortic valve: Trileaflet; mildly thickened, mildly calcified   leaflets. There was mild regurgitation. - Aortic root: The aortic root was normal in size. - Mitral valve: Calcified annulus. Mildly thickened leaflets .   There was no regurgitation. - Left atrium: The atrium was normal in size. - Right ventricle: Systolic function was normal. - Right atrium: The atrium was mildly dilated. - Tricuspid valve: There was moderate regurgitation. - Pulmonic valve: There was mild regurgitation. - Pulmonary arteries: The main pulmonary artery was normal-sized.   Systolic pressure was moderately increased. PA peak pressure: 48   mm Hg (S). - Inferior vena cava: The vessel  was dilated. The respirophasic   diameter changes were blunted (< 50%), consistent with elevated   central venous pressure. - Pericardium, extracardiac: There was no pericardial effusion.  08/02/16 Carotid u/s - Technically difficult study due to patient movement, cooperation, vessel tortuosity, and acoustic shadowing. Findings are consistent with a 1-39 percent stenosis involving the right internal carotid artery and the left internal carotid artery. The vertebral arteries demonstrate antegrade flow.   02/21/18 MRI brain  No acute finding. 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     ASSESSMENT AND PLAN  77 y.o. year old female here with history of HTN presenting with right gaze and left hemiplegia. She received IV t-PA 07/28/2016 at 2106. Taken to intervention, where she received near complete revascularization of occluded superior right MCA with Solitaire and intra-arterial Integrilin with TICI 2B revascularization.  Dx:  Seizure, late effect of stroke (HCC)  Cerebrovascular accident (CVA) due to thrombosis of right middle cerebral artery (HCC) s/p mechanical thrombectomy  Depression, unspecified depression type    Stroke:  right MCA infarct s/p IV tPA and thrombectomy with TICI2b revascularization of R MCA, embolic secondary to newly diagnosed atrial fibrillation   Resultant  left hemiplegia, left neglect, dysarthric, expressive > receptive aphasia  Atrial Fibrillation  Hypertension  Hyperlipidemia  Depression      PLAN:  SEIZURE PREVENTION - increase gabapentin to 600mg  three times a day (for neuropathic pain and seizure prevention)  STROKE PREVENTION  - continue apixaban, statin, BP control  DEPRESSION, AGITATION, PARANOIA - continue sertraline - consider psychiatry / psychology referral  Meds ordered this encounter  Medications  . gabapentin (NEURONTIN) 300 MG capsule    Sig: Take 2 capsules (600 mg  total) by mouth 3 (three) times  daily.    Dispense:  180 capsule    Refill:  12  . sertraline (ZOLOFT) 100 MG tablet    Sig: Take 1 tablet (100 mg total) by mouth at bedtime.    Dispense:  30 tablet    Refill:  12   Return in about 6 months (around 10/18/2018) for with NP Shanda Bumps V).      Suanne Marker, MD 04/18/2018, 10:51 AM Certified in Neurology, Neurophysiology and Neuroimaging  [facility] [address] [place], [place] [address] [phone]

## 2018-04-18 NOTE — Patient Instructions (Addendum)
  SEIZURE PREVENTION - increase gabapentin to 600mg  three times a day (for neuropathic pain and seizure prevention)  STROKE PREVENTION  - continue apixaban, statin, BP control  DEPRESSION, AGITATION, PARANOIA - increase sertraline to 100mg  daily - consider psychiatry / psychology referral

## 2018-05-01 ENCOUNTER — Institutional Professional Consult (permissible substitution): Payer: BLUE CROSS/BLUE SHIELD | Admitting: Diagnostic Neuroimaging

## 2018-06-17 IMAGING — CT CT HEAD W/O CM
1 series · 16 of 30 positions shown, 20 images · non-contrast
Comparison: MRI brain 07/29/2016.  CT head 07/28/2016.

CLINICAL DATA: Continued surveillance RIGHT MCA thrombosis with
stroke.

EXAM:
CT HEAD WITHOUT CONTRAST
TECHNIQUE: Contiguous axial images were obtained from the base of the skull
through the vertex without intravenous contrast.

[Series 2: head w/(date) · axial · 0.41mm/px · z∈[-280,-145]mm · 16 of 30 slices shown, 20 images]
[im 2/30  brain]
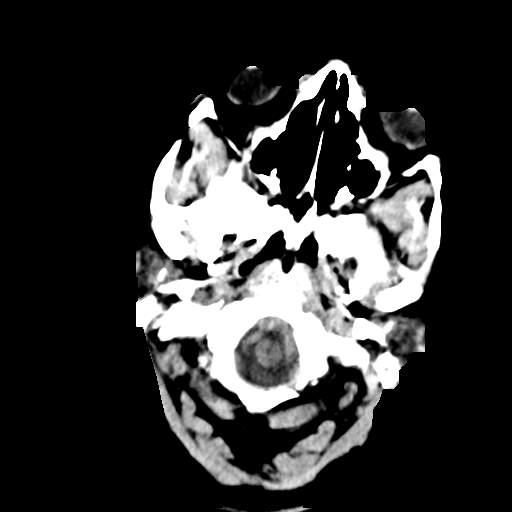
[im 2/30  bone]
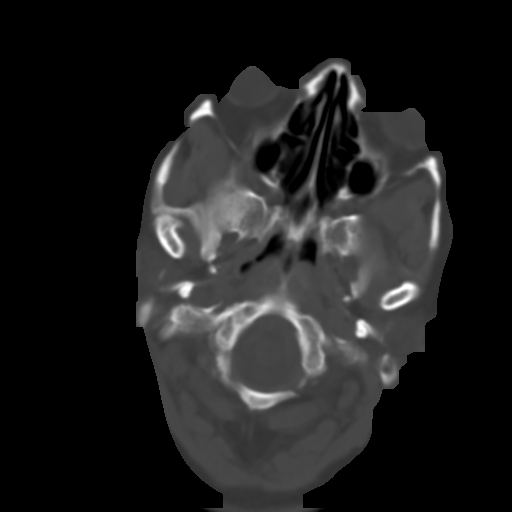
[im 4/30  brain]
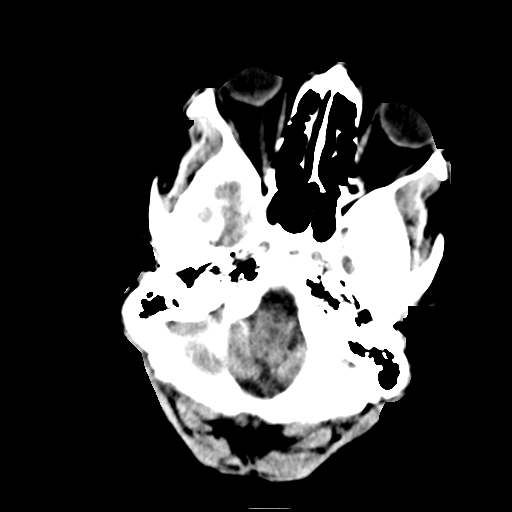
[im 6/30  brain]
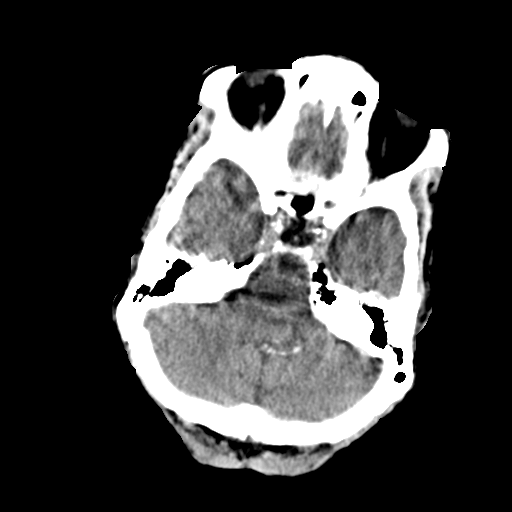
[im 8/30  brain]
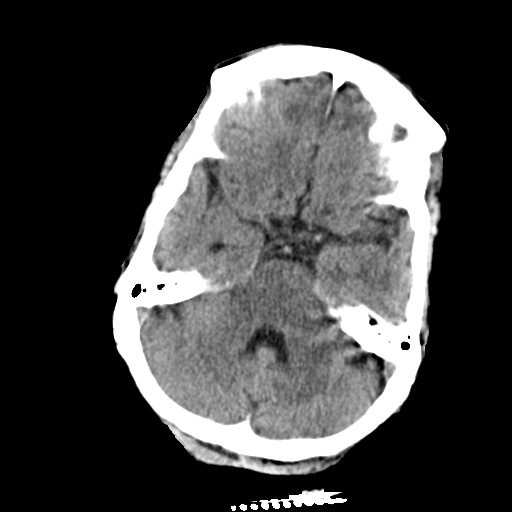
[im 9/30  brain]
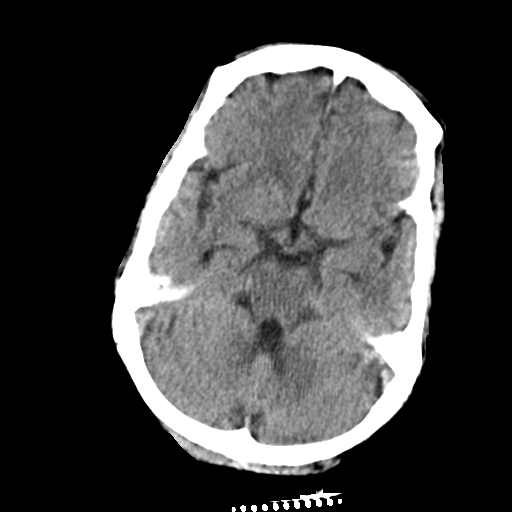
[im 9/30  bone]
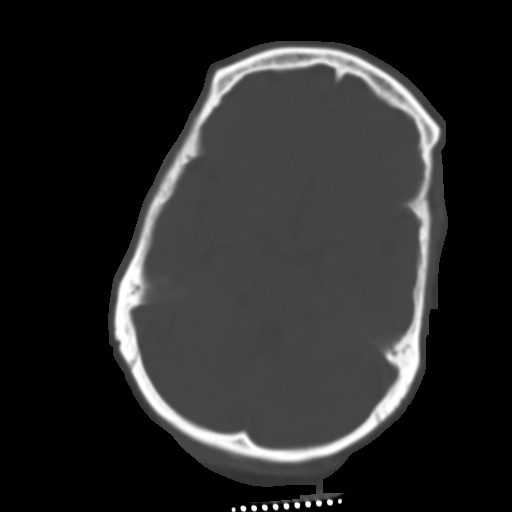
[im 11/30  brain]
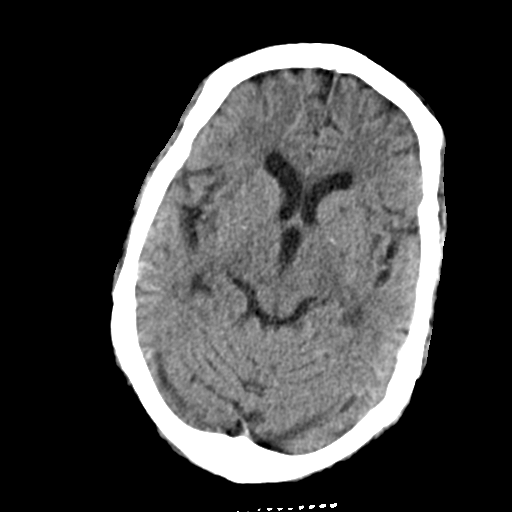
[im 13/30  brain]
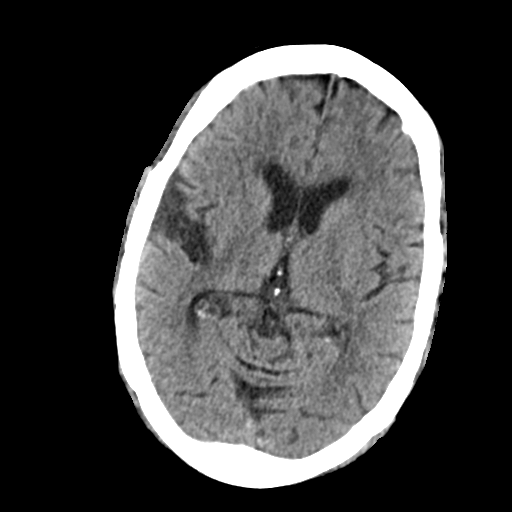
[im 15/30  brain]
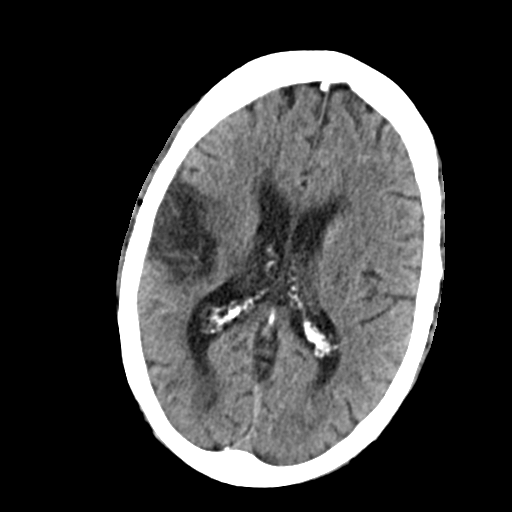
[im 16/30  brain]
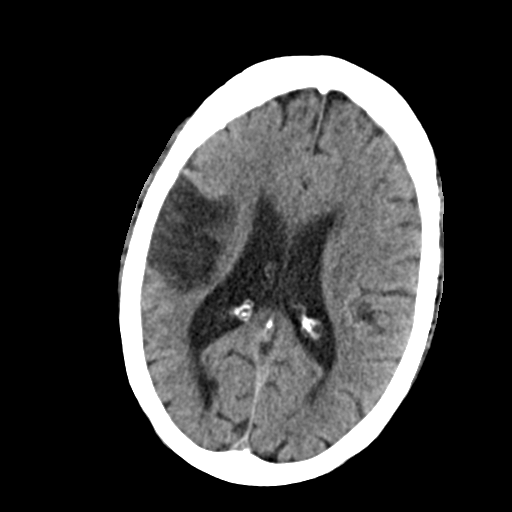
[im 16/30  bone]
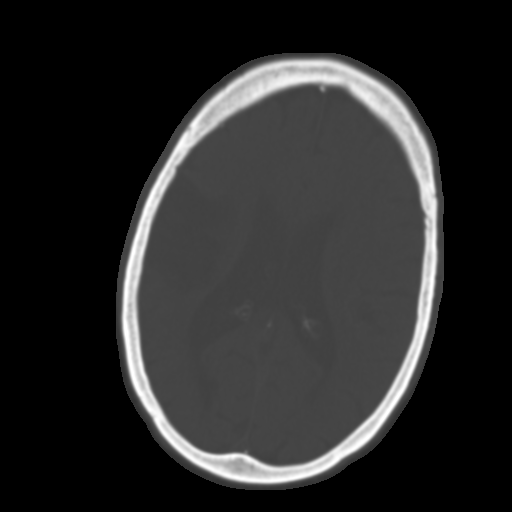
[im 18/30  brain]
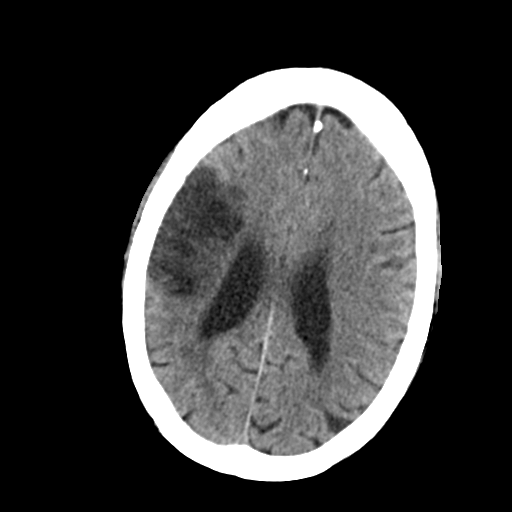
[im 20/30  brain]
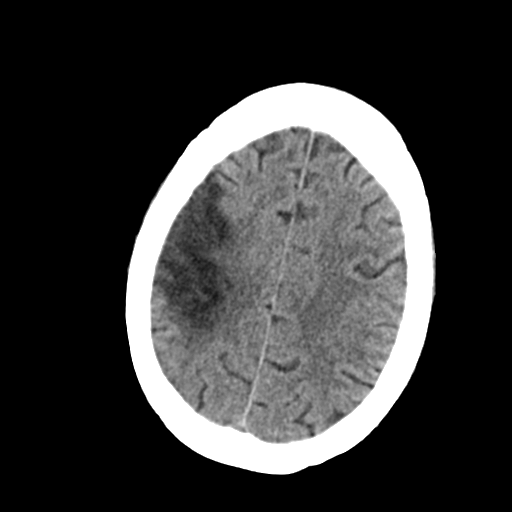
[im 22/30  brain]
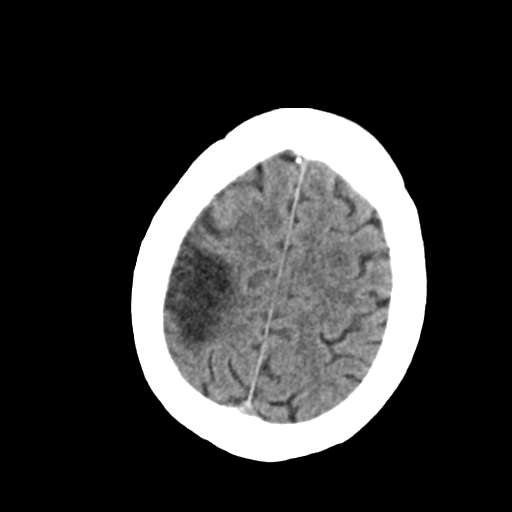
[im 23/30  brain]
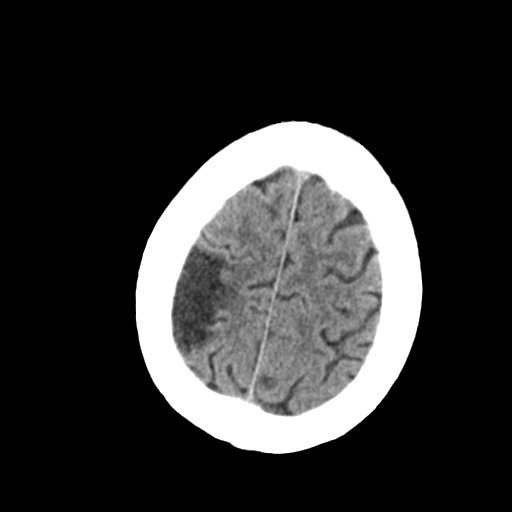
[im 23/30  bone]
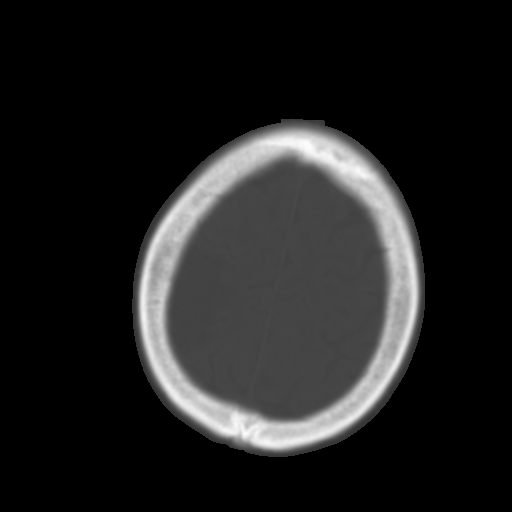
[im 25/30  brain]
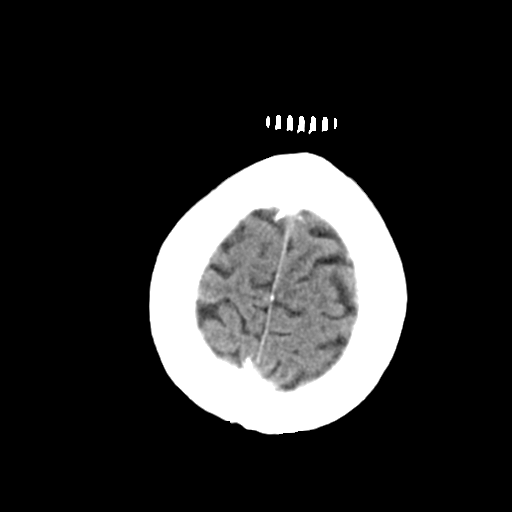
[im 27/30  brain]
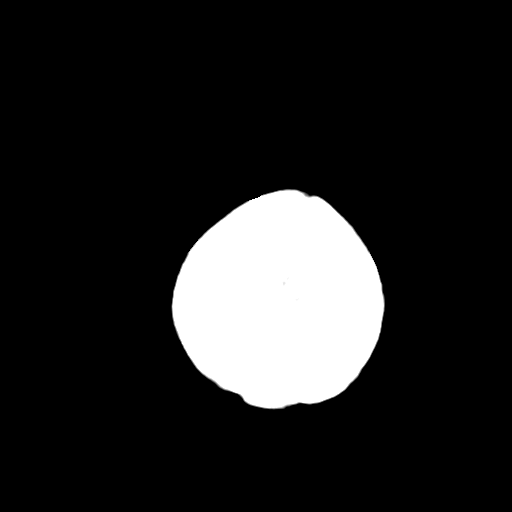
[im 29/30  brain]
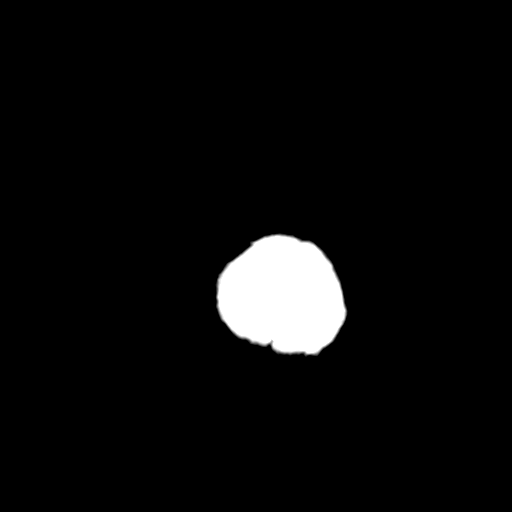

[16 of 30 positions shown; findings below may reference images not displayed]

FINDINGS: Brain: Large area of encephalomalacia status post RIGHT MCA
occlusion with thrombectomy, TICI 2B reperfusion. The RIGHT frontal
lobe, and RIGHT insula are predominantly affected with some RIGHT
anterior parietal involvement. Basal ganglia largely spared. No
acute areas of infarction, hemorrhage, mass lesion, hydrocephalus,
or extra-axial fluid. Mild atrophy. Chronic microvascular ischemic
change of mild-to-moderate nature.

Vascular: No hyperdense vessel or unexpected calcification.

Skull: Normal. Negative for fracture or focal lesion.

Sinuses/Orbits: Negative.

Other: None.
IMPRESSION: Extensive brain substance loss related to RIGHT MCA territory
cerebral infarction occurring 2 months ago. No acute intracranial
findings.

## 2018-06-22 ENCOUNTER — Ambulatory Visit: Payer: Medicare HMO | Admitting: Cardiology

## 2018-08-26 ENCOUNTER — Emergency Department: Payer: Medicare HMO

## 2018-08-26 ENCOUNTER — Encounter: Payer: Self-pay | Admitting: Emergency Medicine

## 2018-08-26 ENCOUNTER — Emergency Department
Admission: EM | Admit: 2018-08-26 | Discharge: 2018-08-26 | Disposition: A | Discharge status: Home / Self Care | Payer: Medicare HMO | Attending: Emergency Medicine | Admitting: Emergency Medicine

## 2018-08-26 ENCOUNTER — Other Ambulatory Visit: Payer: Self-pay

## 2018-08-26 DIAGNOSIS — I10 Essential (primary) hypertension: Secondary | ICD-10-CM | POA: Insufficient documentation

## 2018-08-26 DIAGNOSIS — R569 Unspecified convulsions: Secondary | ICD-10-CM | POA: Insufficient documentation

## 2018-08-26 DIAGNOSIS — Z8673 Personal history of transient ischemic attack (TIA), and cerebral infarction without residual deficits: Secondary | ICD-10-CM | POA: Diagnosis not present

## 2018-08-26 DIAGNOSIS — Z96641 Presence of right artificial hip joint: Secondary | ICD-10-CM | POA: Diagnosis not present

## 2018-08-26 DIAGNOSIS — F329 Major depressive disorder, single episode, unspecified: Secondary | ICD-10-CM | POA: Diagnosis not present

## 2018-08-26 LAB — CBC WITH DIFFERENTIAL/PLATELET
Abs Immature Granulocytes: 0.12 10*3/uL — ABNORMAL HIGH (ref 0.00–0.07)
BASOS ABS: 0 10*3/uL (ref 0.0–0.1)
Basophils Relative: 0 %
EOS ABS: 0.1 10*3/uL (ref 0.0–0.5)
EOS PCT: 2 %
HCT: 37.2 % (ref 36.0–46.0)
Hemoglobin: 11.2 g/dL — ABNORMAL LOW (ref 12.0–15.0)
IMMATURE GRANULOCYTES: 2 %
LYMPHS PCT: 26 %
Lymphs Abs: 1.9 10*3/uL (ref 0.7–4.0)
MCH: 26.7 pg (ref 26.0–34.0)
MCHC: 30.1 g/dL (ref 30.0–36.0)
MCV: 88.6 fL (ref 80.0–100.0)
Monocytes Absolute: 0.5 10*3/uL (ref 0.1–1.0)
Monocytes Relative: 6 %
NEUTROS PCT: 64 %
NRBC: 0 % (ref 0.0–0.2)
Neutro Abs: 4.8 10*3/uL (ref 1.7–7.7)
PLATELETS: 142 10*3/uL — AB (ref 150–400)
RBC: 4.2 MIL/uL (ref 3.87–5.11)
RDW: 14.9 % (ref 11.5–15.5)
WBC: 7.5 10*3/uL (ref 4.0–10.5)

## 2018-08-26 LAB — URINALYSIS, COMPLETE (UACMP) WITH MICROSCOPIC
Bacteria, UA: NONE SEEN
Bilirubin Urine: NEGATIVE
GLUCOSE, UA: NEGATIVE mg/dL
Ketones, ur: NEGATIVE mg/dL
Leukocytes, UA: NEGATIVE
NITRITE: NEGATIVE
Protein, ur: NEGATIVE mg/dL
SPECIFIC GRAVITY, URINE: 1.018 (ref 1.005–1.030)
pH: 5 (ref 5.0–8.0)

## 2018-08-26 LAB — COMPREHENSIVE METABOLIC PANEL
ALK PHOS: 93 U/L (ref 38–126)
ALT: 15 U/L (ref 0–44)
AST: 20 U/L (ref 15–41)
Albumin: 4 g/dL (ref 3.5–5.0)
Anion gap: 8 (ref 5–15)
BILIRUBIN TOTAL: 0.5 mg/dL (ref 0.3–1.2)
BUN: 23 mg/dL (ref 8–23)
CALCIUM: 9.3 mg/dL (ref 8.9–10.3)
CO2: 25 mmol/L (ref 22–32)
CREATININE: 0.78 mg/dL (ref 0.44–1.00)
Chloride: 106 mmol/L (ref 98–111)
Glucose, Bld: 109 mg/dL — ABNORMAL HIGH (ref 70–99)
Potassium: 3.9 mmol/L (ref 3.5–5.1)
SODIUM: 139 mmol/L (ref 135–145)
Total Protein: 8 g/dL (ref 6.5–8.1)

## 2018-08-26 LAB — TROPONIN I: Troponin I: <0.03 ng/mL (ref <0.03)

## 2018-08-26 MED ORDER — LEVETIRACETAM 750 MG PO TABS: 750.0000 mg | ORAL_TABLET | Freq: Two times a day (BID) | ORAL | 2 refills | Status: DC

## 2018-08-26 MED ORDER — LEVETIRACETAM IN NACL 1000 MG/100ML IV SOLN
1000.0000 mg | Freq: Once | INTRAVENOUS | Status: AC
  Administered 2018-08-26: 1000 mg via INTRAVENOUS
  Filled 2018-08-26: qty 100

## 2018-08-26 NOTE — ED Notes (Signed)
Family at bedside. 

## 2018-08-26 NOTE — ED Triage Notes (Signed)
Arrives via [facility].  Patient was at home, daughter came downstairs and witnessed patient having a seizure.  On EMS arival, patient was post ictal.  Has history of CVA and seizures.   CBG:  140.  Patient arrives awake and alert.  Patient is confused to place.  22g saline lock to right hand.

## 2018-08-26 NOTE — ED Notes (Signed)
Pt alert and talking at this time, answering questions appropriately. Pt has hx of CVA in the past.

## 2018-08-26 NOTE — ED Notes (Signed)
Informed Dr. Mayford Knife family was at bedside.

## 2018-08-26 NOTE — ED Notes (Signed)
Report to Dee RN

## 2018-08-26 NOTE — ED Provider Notes (Signed)
[facility] Emergency Department Provider Note       Time seen: ----------------------------------------- 9:22 AM on 08/26/2018 -----------------------------------------   I have reviewed the triage vital signs and the nursing notes.  HISTORY   Chief Complaint Seizures    HPI Diana Bush is a 77 y.o. female with a history of hyperlipidemia, GERD, hypertension, osteoarthritis, atrial fibrillation, seizures, stroke who presents to the ED for seizure-like activity.  Patient is bedbound and lives at home with daughter.  Daughter came downstairs and witnessed the patient having some type of seizure.  On EMS arrival she was postictal.  She does have a left-sided hemiparesis from previous CVA.  Currently she does not take any seizure medication according to EMS.  Patient denies any other complaints at this time.  Past Medical History:  Diagnosis Date  . Borderline hyperlipidemia   . Difficulty sleeping    DUE TO PAIN  . GERD (gastroesophageal reflux disease)   . Hypertension   . Neuromuscular disorder (HCC)   . Osteoarthritis   . PAF (paroxysmal atrial fibrillation) (HCC)   . Seizures (HCC)   . Stroke [facility]) 07/2016    Patient Active Problem List   Diagnosis Date Noted  . Seizures (HCC) 02/20/2018  . Persistent atrial fibrillation 05/12/2017  . Hypertensive emergency 08/04/2016  . Hypokalemia 08/04/2016  . Depression   . Cerebrovascular accident (CVA) due to thrombosis of right middle cerebral artery (HCC) s/p mechanical thrombectomy   . Hemiplegia affecting left nondominant side (HCC)   . HLD (hyperlipidemia)   . Dysphagia, post-stroke   . Dysarthria, post-stroke   . Expressive aphasia   . Acute blood loss anemia   . Benign essential HTN   . Atrial fibrillation with RVR (HCC) 07/29/2016  . Osteoarthritis of right hip 08/22/2015  . 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 IR  GENERIC HISTORICAL  07/28/2016   IR PERCUTANEOUS ART THROMBECTOMY/INFUSION INTRACRANIAL INC DIAG ANGIO 07/28/2016 Julieanne Cotton, MD [facility]-INTERV RAD  . LYMPH NODE BIOPSY     BENIGN  . RADIOLOGY WITH ANESTHESIA N/A 07/28/2016   Procedure: RADIOLOGY WITH ANESTHESIA;  Surgeon: Julieanne Cotton, MD;  Location: [facility] OR;  Service: Radiology;  Laterality: N/A;  . TOTAL HIP ARTHROPLASTY Right 08/22/2015   Procedure: RIGHT TOTAL HIP ARTHROPLASTY ANTERIOR APPROACH;  Surgeon: Kathryne Hitch, MD;  Location: [facility] ORS;  Service: Orthopedics;  Laterality: Right;    Allergies Patient has no known allergies.  Social History Social History   Tobacco Use  . Smoking status: Never Smoker  . Smokeless tobacco: Never Used  Substance Use Topics  . Alcohol use: No  . Drug use: No   Review of Systems Constitutional: Negative for fever. Cardiovascular: Negative for chest pain. Respiratory: Negative for shortness of breath. Gastrointestinal: Negative for abdominal pain, vomiting and diarrhea. Musculoskeletal: Negative for back pain. Neurological: Positive for seizure  All systems negative/normal/unremarkable except as stated in the HPI  ____________________________________________   PHYSICAL EXAM:  VITAL SIGNS: ED Triage Vitals  Enc Vitals Group     BP      Pulse      Resp      Temp      Temp src      SpO2      Weight      Height      Head Circumference      Peak Flow      Pain Score  Pain Loc      Pain Edu?      Excl. in GC?    Constitutional: Alert but disoriented.  No acute distress Eyes: Conjunctivae are normal. Normal extraocular movements. ENT   Head: Normocephalic and atraumatic.   Nose: No congestion/rhinnorhea.   Mouth/Throat: Mucous membranes are moist.   Neck: No stridor. Cardiovascular: Normal rate, regular rhythm. No murmurs, rubs, or gallops. Respiratory: Normal respiratory effort without tachypnea nor retractions. Breath sounds are clear and equal  bilaterally. No wheezes/rales/rhonchi. Gastrointestinal: Soft and nontender. Normal bowel sounds Musculoskeletal: Nontender with normal range of motion in extremities. No lower extremity tenderness nor edema. Neurologic:  Normal speech and language.  Chronic left-sided hemiparesis is noted Skin:  Skin is warm, dry and intact. No rash noted. Psychiatric: Mood and affect are normal. Speech and behavior are normal.  ____________________________________________  EKG: Interpreted by me.  Sinus rhythm the rate of 77 bpm, LVH, leftward axis, normal QT  ____________________________________________  ED COURSE:  As part of my medical decision making, I reviewed the following data within the electronic MEDICAL RECORD NUMBER History obtained from family if available, nursing notes, old chart and ekg, as well as notes from prior ED visits. Patient presented for seizure-like activity, we will assess with labs and imaging as indicated at this time. Clinical Course as of Aug 26 1232  Sat Aug 26, 2018  1126 Patient was discussed with neurology who feels that gabapentin is likely insufficient to prevent seizures.  We will add Keppra and she will go home with a prescription for same.   [JW]    Clinical Course User Index [JW] Emily Filbert, MD   Procedures ____________________________________________   LABS (pertinent positives/negatives)  Labs Reviewed  CBC WITH DIFFERENTIAL/PLATELET - Abnormal; Notable for the following components:      Result Value   Hemoglobin 11.2 (*)    Platelets 142 (*)    Abs Immature Granulocytes 0.12 (*)    All other components within normal limits  URINALYSIS, COMPLETE (UACMP) WITH MICROSCOPIC - Abnormal; Notable for the following components:   Color, Urine YELLOW (*)    APPearance CLEAR (*)    Hgb urine dipstick SMALL (*)    All other components within normal limits  COMPREHENSIVE METABOLIC PANEL - Abnormal; Notable for the following components:   Glucose, Bld 109  (*)    All other components within normal limits  TROPONIN I  CBG MONITORING, ED    RADIOLOGY Images were viewed by me IMPRESSION: No acute cardiopulmonary disease.  Stable cardiomegaly.  CT head IMPRESSION: No acute findings.  Chronic ischemic microvascular disease. Moderate size old right MCA territory infarct.  Slight worsening opacification over the left mastoid air cells. ____________________________________________  DIFFERENTIAL DIAGNOSIS   Seizure, CVA, dehydration, electrolyte abnormality, occult infection  FINAL ASSESSMENT AND PLAN  Seizure   Plan: The patient had presented for seizure-like activity. Patient's labs did not reveal any acute process. Patient's imaging was also unremarkable.  I did discuss with neurology who recommends adding Keppra to her gabapentin.  She was loaded with a gram here and has been at her baseline.  She will be discharged home with 750 mg twice a day.   Ulice Dash, MD   Note: This note was generated in part or whole with voice recognition software. Voice recognition is usually quite accurate but there are transcription errors that can and very often do occur. I apologize for any typographical errors that were not detected and corrected.  Emily Filbert, MD 08/26/18 [phone]

## 2018-09-04 ENCOUNTER — Other Ambulatory Visit: Payer: Self-pay | Admitting: Diagnostic Neuroimaging

## 2018-09-05 ENCOUNTER — Telehealth: Payer: Self-pay | Admitting: Diagnostic Neuroimaging

## 2018-09-05 MED ORDER — LEVETIRACETAM 500 MG PO TABS: 500.0000 mg | ORAL_TABLET | Freq: Two times a day (BID) | ORAL | 5 refills | Status: AC

## 2018-09-05 NOTE — Addendum Note (Signed)
Addended by: Colen Darling C on: 09/05/2018 11:35 AM   Modules accepted: Orders

## 2018-09-05 NOTE — Telephone Encounter (Signed)
Pt daughter(on DPR-Wolbert,Rebecca) has called with great concern to pt's dosage of levETIRAcetam (KEPPRA) 750 MG tablet.  Daughter states pt talks of death, passed loved ones, acts somewhat depressed and her eating habits have changed.  These changes are concerning to daughter and she'd like a call back to discuss

## 2018-09-05 NOTE — Telephone Encounter (Addendum)
Per Dr Marjory Lies, called daughter, [name] and informed her that DR [facility] is reducing her mom's Keppra dose. She will begin taking Keppra 500 mg two times a day. [name] asked if tremors was a possible side effect; this RN advised shaking can be a side effect. Advised her that if her mom's symptoms do not improve over the next few days to call back. Confirmed pharmacy with daughter. Daughter verbalized understanding, appreciation of call.  New Rx e scribed to [facility] [place], Keppra 500 mg tabs, take one tab twice daily.

## 2018-10-19 ENCOUNTER — Telehealth: Payer: Self-pay

## 2018-10-19 ENCOUNTER — Ambulatory Visit: Payer: BLUE CROSS/BLUE SHIELD | Admitting: Adult Health

## 2018-10-19 NOTE — Telephone Encounter (Signed)
Patient no show for appt today. 

## 2018-10-20 ENCOUNTER — Encounter: Payer: Self-pay | Admitting: Adult Health

## 2019-04-30 ENCOUNTER — Other Ambulatory Visit: Payer: Self-pay | Admitting: Diagnostic Neuroimaging

## 2019-04-30 NOTE — Telephone Encounter (Signed)
Refilled gabapentin x 2 months with note to pharmacy: have patient cal and schedule Follow up.

## 2019-05-22 ENCOUNTER — Telehealth: Payer: Self-pay

## 2019-05-22 NOTE — Telephone Encounter (Signed)
Called patient- advised that Dr.Hochrein had available spots for a virtual clinic day on 07/17- patient is overdue for appointment, pt advised she would call back.

## 2019-08-06 ENCOUNTER — Other Ambulatory Visit: Payer: Self-pay | Admitting: Diagnostic Neuroimaging

## 2019-09-08 ENCOUNTER — Other Ambulatory Visit: Payer: Self-pay | Admitting: Diagnostic Neuroimaging

## 2019-10-26 ENCOUNTER — Other Ambulatory Visit: Payer: Self-pay | Admitting: Diagnostic Neuroimaging

## 2019-11-03 ENCOUNTER — Other Ambulatory Visit: Payer: Self-pay | Admitting: Diagnostic Neuroimaging

## 2019-11-06 ENCOUNTER — Other Ambulatory Visit: Payer: Self-pay | Admitting: Diagnostic Neuroimaging

## 2019-11-28 ENCOUNTER — Other Ambulatory Visit: Payer: Self-pay | Admitting: Diagnostic Neuroimaging

## 2019-11-30 ENCOUNTER — Other Ambulatory Visit: Payer: Self-pay | Admitting: Diagnostic Neuroimaging

## 2020-01-16 ENCOUNTER — Encounter (HOSPITAL_COMMUNITY): Payer: Self-pay | Admitting: Emergency Medicine

## 2020-01-16 ENCOUNTER — Emergency Department (HOSPITAL_COMMUNITY): Payer: Medicare HMO

## 2020-01-16 ENCOUNTER — Other Ambulatory Visit: Payer: Self-pay

## 2020-01-16 ENCOUNTER — Emergency Department (HOSPITAL_COMMUNITY)
Admission: EM | Admit: 2020-01-16 | Discharge: 2020-01-16 | Disposition: A | Discharge status: Home / Self Care | Payer: Medicare HMO | Attending: Emergency Medicine | Admitting: Emergency Medicine

## 2020-01-16 DIAGNOSIS — Z79899 Other long term (current) drug therapy: Secondary | ICD-10-CM | POA: Diagnosis not present

## 2020-01-16 DIAGNOSIS — Z7901 Long term (current) use of anticoagulants: Secondary | ICD-10-CM | POA: Insufficient documentation

## 2020-01-16 DIAGNOSIS — R05 Cough: Secondary | ICD-10-CM | POA: Diagnosis not present

## 2020-01-16 DIAGNOSIS — R197 Diarrhea, unspecified: Secondary | ICD-10-CM | POA: Diagnosis not present

## 2020-01-16 DIAGNOSIS — Z20822 Contact with and (suspected) exposure to covid-19: Secondary | ICD-10-CM | POA: Diagnosis not present

## 2020-01-16 DIAGNOSIS — R069 Unspecified abnormalities of breathing: Secondary | ICD-10-CM | POA: Insufficient documentation

## 2020-01-16 DIAGNOSIS — I4891 Unspecified atrial fibrillation: Secondary | ICD-10-CM | POA: Diagnosis not present

## 2020-01-16 DIAGNOSIS — Z8669 Personal history of other diseases of the nervous system and sense organs: Secondary | ICD-10-CM | POA: Diagnosis not present

## 2020-01-16 DIAGNOSIS — R0789 Other chest pain: Secondary | ICD-10-CM | POA: Diagnosis not present

## 2020-01-16 DIAGNOSIS — I10 Essential (primary) hypertension: Secondary | ICD-10-CM | POA: Diagnosis not present

## 2020-01-16 DIAGNOSIS — Z86711 Personal history of pulmonary embolism: Secondary | ICD-10-CM | POA: Diagnosis not present

## 2020-01-16 LAB — BASIC METABOLIC PANEL
Anion gap: 9 (ref 5–15)
BUN: 11 mg/dL (ref 8–23)
CO2: 28 mmol/L (ref 22–32)
Calcium: 8.9 mg/dL (ref 8.9–10.3)
Chloride: 101 mmol/L (ref 98–111)
Creatinine, Ser: 0.62 mg/dL (ref 0.44–1.00)
GFR calc Af Amer: >60 mL/min (ref >60)
GFR calc non Af Amer: >60 mL/min (ref >60)
Glucose, Bld: 107 mg/dL — ABNORMAL HIGH (ref 70–99)
Potassium: 3.9 mmol/L (ref 3.5–5.1)
Sodium: 138 mmol/L (ref 135–145)

## 2020-01-16 LAB — BRAIN NATRIURETIC PEPTIDE: B Natriuretic Peptide: 141.7 pg/mL — ABNORMAL HIGH (ref 0.0–100.0)

## 2020-01-16 LAB — CBC
HCT: 37.9 % (ref 36.0–46.0)
Hemoglobin: 11.4 g/dL — ABNORMAL LOW (ref 12.0–15.0)
MCH: 26.4 pg (ref 26.0–34.0)
MCHC: 30.1 g/dL (ref 30.0–36.0)
MCV: 87.7 fL (ref 80.0–100.0)
Platelets: 215 10*3/uL (ref 150–400)
RBC: 4.32 MIL/uL (ref 3.87–5.11)
RDW: 14.6 % (ref 11.5–15.5)
WBC: 7.8 10*3/uL (ref 4.0–10.5)
nRBC: 0 % (ref 0.0–0.2)

## 2020-01-16 LAB — TROPONIN I (HIGH SENSITIVITY)
Troponin I (High Sensitivity): 10 ng/L (ref <18)
Troponin I (High Sensitivity): 9 ng/L (ref <18)

## 2020-01-16 LAB — D-DIMER, QUANTITATIVE: D-Dimer, Quant: 0.5 ug/mL-FEU (ref 0.00–0.50)

## 2020-01-16 LAB — SARS CORONAVIRUS 2 (TAT 6-24 HRS): SARS Coronavirus 2: NEGATIVE

## 2020-01-16 LAB — POC SARS CORONAVIRUS 2 AG -  ED: SARS Coronavirus 2 Ag: NEGATIVE

## 2020-01-16 MED ORDER — BENZONATATE 100 MG PO CAPS: 100.0000 mg | ORAL_CAPSULE | Freq: Three times a day (TID) | ORAL | 0 refills | Status: AC | PRN

## 2020-01-16 MED ORDER — SODIUM CHLORIDE 0.9% FLUSH: 3.0000 mL | Freq: Once | INTRAVENOUS | Status: DC

## 2020-01-16 MED ORDER — OMEPRAZOLE 20 MG PO CPDR: 20.0000 mg | DELAYED_RELEASE_CAPSULE | Freq: Every day | ORAL | 0 refills | Status: AC

## 2020-01-16 MED ORDER — PANTOPRAZOLE SODIUM 40 MG IV SOLR
40.0000 mg | Freq: Once | INTRAVENOUS | Status: AC
  Administered 2020-01-16: 40 mg via INTRAVENOUS

## 2020-01-16 NOTE — ED Triage Notes (Signed)
Pt to triage via [facility] from home.  Reports L sided chest pain and L breast pain.  History of L sided deficits from previous stroke.  Pt non-ambulatory.  CBG 154.

## 2020-01-16 NOTE — ED Provider Notes (Signed)
[facility] EMERGENCY DEPARTMENT Provider Note   CSN: [id number] Arrival date & time: 01/16/20  1310     History Chief Complaint  Patient presents with  . Chest Pain    Celenia Hruska is a 79 y.o. female.  The history is provided by the patient and medical records. No language interpreter was used.   Kamira Mellette is a 79 y.o. female who presents to the Emergency Department complaining of chest pain. She has been experiencing one week of cough and congestion. She saw her PCP was treated with a zpack. She lives at home with her daughter and today the patient began to develop intermittent chest pain and appeared to have some labored breathing. She also saw her PCP yesterday and was told her BP was low and she was told to discontinue her amlodipine. She has associated diarrhea. No reports of fevers. She has chest pain free on ED evaluation. 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 No missed doses. No known COVID 19 exposures.     Past Medical History:  Diagnosis Date  . Borderline hyperlipidemia   . Difficulty sleeping    DUE TO PAIN  . GERD (gastroesophageal reflux disease)   . Hypertension   . Neuromuscular disorder ([place])   . Osteoarthritis   . PAF (paroxysmal atrial fibrillation) ([place])   . Seizures ([place])   . Stroke [facility]) 07/2016    Patient Active Problem List   Diagnosis Date Noted  . Seizures ([place]) 02/20/2018  . Persistent atrial fibrillation ([place]) 05/12/2017  . Hypertensive emergency 08/04/2016  . Hypokalemia 08/04/2016  . Depression   . Cerebrovascular accident (CVA) due to thrombosis of right middle cerebral artery (HCC) s/p mechanical thrombectomy   . 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)   . HLD (hyperlipidemia)   . Dysphagia, post-stroke   . Dysarthria, post-stroke   . Expressive aphasia   . Acute blood loss anemia   . Benign essential HTN   . Atrial fibrillation with RVR ([place]) 07/29/2016  . Osteoarthritis of right hip  08/22/2015  . 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 IR GENERIC HISTORICAL  07/28/2016   IR PERCUTANEOUS ART THROMBECTOMY/INFUSION INTRACRANIAL INC DIAG ANGIO 07/28/2016 Luanne Bras, MD [facility]-INTERV RAD  . LYMPH NODE BIOPSY     BENIGN  . RADIOLOGY WITH ANESTHESIA N/A 07/28/2016   Procedure: RADIOLOGY WITH ANESTHESIA;  Surgeon: Luanne Bras, MD;  Location: [place];  Service: Radiology;  Laterality: N/A;  . TOTAL HIP ARTHROPLASTY Right 08/22/2015   Procedure: RIGHT TOTAL HIP ARTHROPLASTY ANTERIOR APPROACH;  Surgeon: Mcarthur Rossetti, MD;  Location: [facility];  Service: Orthopedics;  Laterality: Right;     OB History   No obstetric history on file.     Family History  Problem Relation Age of Onset  . Cancer Mother   . Alzheimer's disease Mother   . Heart disease Neg Hx   . Diabetes Neg Hx   . Stroke Neg Hx     Social History   Tobacco Use  . Smoking status: Never Smoker  . Smokeless tobacco: Never Used  Substance Use Topics  . Alcohol use: No  . Drug use: No    Home Medications Prior to Admission medications   Medication Sig Start Date End Date Taking? Authorizing Provider  amLODipine (NORVASC) 2.5 MG tablet Take 1 tablet (2.5 mg total) by mouth daily. 11/02/16 04/18/18  Minus Breeding, MD  apixaban (  ELIQUIS) 5 MG TABS tablet Take 5 mg by mouth 2 (two) times daily.    [provider]  atorvastatin (LIPITOR) 20 MG tablet Take 1 tablet (20 mg total) by mouth daily at 6 PM. 08/04/16   Marvel Plan, MD  baclofen (LIORESAL) 10 MG tablet Take 1 tablet (10 mg total) by mouth 3 (three) times daily. 09/08/16 as needed 02/23/18   Katha Hamming, MD  cetirizine (ZYRTEC) 10 MG tablet Take 10 mg by mouth daily as needed. 03/23/18   [provider]  furosemide (LASIX) 20 MG tablet TAKE ONE TABLET BY MOUTH DAILY AS NEEDED FOR LEFT ARM/LEG SWELLING 03/23/18   [provider]    gabapentin (NEURONTIN) 300 MG capsule TAKE 2 CAPSULES (600 MG TOTAL) BY MOUTH 3 (THREE) TIMES DAILY. 09/10/19   Penumalli, Glenford Bayley, MD  levETIRAcetam (KEPPRA) 500 MG tablet Take 1 tablet (500 mg total) by mouth 2 (two) times daily. 09/05/18   Penumalli, Glenford Bayley, MD  lidocaine (LIDODERM) 5 % Place 1 patch onto the skin daily as needed (pain). Remove & Discard patch within 12 hours or as directed by MD     [provider]  losartan (COZAAR) 50 MG tablet Take 1 tablet (50 mg total) by mouth daily. 02/23/18 02/23/19  Katha Hamming, MD  metoprolol tartrate (LOPRESSOR) 25 MG tablet Take 1 tablet (25 mg total) by mouth 2 (two) times daily. 02/23/18 02/23/19  Katha Hamming, MD  ranitidine (ZANTAC) 150 MG tablet Take 150 mg by mouth 2 (two) times daily. 03/23/18   [provider]  sertraline (ZOLOFT) 100 MG tablet TAKE 1 TABLET BY MOUTH EVERYDAY AT BEDTIME 09/04/18   Penumalli, Glenford Bayley, MD    Allergies    Patient has no known allergies.  Review of Systems   Review of Systems  All other systems reviewed and are negative.   Physical Exam Updated Vital Signs BP (!) 161/73 (BP Location: Right Arm)   Pulse (!) 53   Temp 99 F (37.2 C) (Oral)   Resp 15   SpO2 100%   Physical Exam Vitals and nursing note reviewed.  Constitutional:      Appearance: She is well-developed.  HENT:     Head: Normocephalic and atraumatic.  Cardiovascular:     Rate and Rhythm: Normal rate and regular rhythm.     Heart sounds: No murmur.  Pulmonary:     Effort: Pulmonary effort is normal. No respiratory distress.     Breath sounds: Normal breath sounds.  Abdominal:     Palpations: Abdomen is soft.     Tenderness: There is no abdominal tenderness. There is no guarding or rebound.  Musculoskeletal:        General: No tenderness.     Comments: 1+ pitting edema to BLE  Skin:    General: Skin is warm and dry.  Neurological:     Mental Status: She is alert and oriented to person,  place, and time.     Comments: LUE and BLE weakness.  Mild aphashia  Psychiatric:        Behavior: Behavior normal.     ED Results / Procedures / Treatments   Labs (all labs ordered are listed, but only abnormal results are displayed) Labs Reviewed  BASIC METABOLIC PANEL - Abnormal; Notable for the following components:      Result Value   Glucose, Bld 107 (*)    All other components within normal limits  CBC - Abnormal; Notable for the following components:  Hemoglobin 11.4 (*)    All other components within normal limits  TROPONIN I (HIGH SENSITIVITY)  TROPONIN I (HIGH SENSITIVITY)    EKG EKG Interpretation  Date/Time:  Wednesday January 16 2020 13:15:30 EST Ventricular Rate:  54 PR Interval:  232 QRS Duration: 94 QT Interval:  450 QTC Calculation: 426 R Axis:   -18 Text Interpretation: Sinus bradycardia with 1st degree A-V block Moderate voltage criteria for LVH, may be normal variant ( R in aVL , Cornell product ) Nonspecific T wave abnormality Abnormal ECG Confirmed by Tilden Fossa [phone]) on 01/16/2020 3:50:13 PM   Radiology DG Chest 2 View  Result Date: 01/16/2020 CLINICAL DATA:  Chest pain EXAM: CHEST - 2 VIEW COMPARISON:  08/26/2018 FINDINGS: Patient's head obscures the left lung apex. Patient is rotated. No new consolidation or edema. No pleural effusion. Stable cardiomegaly. IMPRESSION: No acute process in the chest Electronically Signed   By: Guadlupe Spanish M.D.   On: 01/16/2020 15:08    Procedures Procedures (including critical care time)  Medications Ordered in ED Medications  sodium chloride flush (NS) 0.9 % injection 3 mL (3 mLs Intravenous Not Given 01/16/20 1553)    ED Course  I have reviewed the triage vital signs and the nursing notes.  Pertinent labs & imaging results that were available during my care of the patient were reviewed by me and considered in my medical decision making (see chart for details).    MDM Rules/Calculators/A&P                      Patient here for evaluation of one week of cough and congestion now with intermittent chest pain. She is chronically ill appearing on evaluation but with no acute respiratory distress. EKG without acute ischemic changes in troponin is negative. BNP is minimally elevated but no evidence of volume overload or pulmonary edema. Presentation is not consistent with PE, pneumonia, acute CHF, ACS. Discussed with patient and daughter home care for atypical chest pain, upper respiratory infection, possibly viral. Low index of suspicion for COVID-19 infection but will send swab. Will treat symptoms with Tessalon pearls. Question some degree of reflux contributing to her chest discomfort, will start PPI. Discussed return precautions, outpatient follow-up.  Krisi Azua was evaluated in Emergency Department on 01/16/2020 for the symptoms described in the history of present illness. She was evaluated in the context of the global COVID-19 pandemic, which necessitated consideration that the patient might be at risk for infection with the SARS-CoV-2 virus that causes COVID-19. Institutional protocols and algorithms that pertain to the evaluation of patients at risk for COVID-19 are in a state of rapid change based on information released by regulatory bodies including the CDC and federal and state organizations. These policies and algorithms were followed during the patient's care in the ED.  Final Clinical Impression(s) / ED Diagnoses Final diagnoses:  Atypical chest pain    Rx / DC Orders ED Discharge Orders    None       Tilden Fossa, MD 01/16/20 2128

## 2020-01-16 NOTE — ED Notes (Signed)
Pt was discharged from the ED. Pt read and understood discharge paperwork. Pt had vital signs completed. Pt conscious, breathing, and A&Ox4. No distress noted. Pt speaking in complete sentences. Pt brought out of the ED via wheelchair. E-signature not available. 

## 2022-04-21 DIAGNOSIS — I69320 Aphasia following cerebral infarction: Secondary | ICD-10-CM | POA: Diagnosis not present

## 2022-04-21 DIAGNOSIS — G9341 Metabolic encephalopathy: Secondary | ICD-10-CM | POA: Diagnosis not present

## 2022-04-21 DIAGNOSIS — N390 Urinary tract infection, site not specified: Secondary | ICD-10-CM | POA: Diagnosis not present

## 2022-04-21 DIAGNOSIS — I69354 Hemiplegia and hemiparesis following cerebral infarction affecting left non-dominant side: Secondary | ICD-10-CM | POA: Diagnosis not present

## 2022-05-13 DIAGNOSIS — I69320 Aphasia following cerebral infarction: Secondary | ICD-10-CM | POA: Diagnosis not present

## 2022-05-13 DIAGNOSIS — I69354 Hemiplegia and hemiparesis following cerebral infarction affecting left non-dominant side: Secondary | ICD-10-CM | POA: Diagnosis not present

## 2022-05-13 DIAGNOSIS — G9341 Metabolic encephalopathy: Secondary | ICD-10-CM | POA: Diagnosis not present

## 2022-05-13 DIAGNOSIS — N390 Urinary tract infection, site not specified: Secondary | ICD-10-CM | POA: Diagnosis not present

## 2022-05-24 DIAGNOSIS — I70209 Unspecified atherosclerosis of native arteries of extremities, unspecified extremity: Secondary | ICD-10-CM | POA: Diagnosis not present

## 2022-05-24 DIAGNOSIS — B351 Tinea unguium: Secondary | ICD-10-CM | POA: Diagnosis not present

## 2022-05-30 DIAGNOSIS — G309 Alzheimer's disease, unspecified: Secondary | ICD-10-CM | POA: Diagnosis not present

## 2022-05-30 DIAGNOSIS — R410 Disorientation, unspecified: Secondary | ICD-10-CM | POA: Diagnosis not present

## 2022-06-07 DIAGNOSIS — I10 Essential (primary) hypertension: Secondary | ICD-10-CM | POA: Diagnosis not present

## 2022-06-07 DIAGNOSIS — I4891 Unspecified atrial fibrillation: Secondary | ICD-10-CM | POA: Diagnosis not present

## 2022-06-07 DIAGNOSIS — G4089 Other seizures: Secondary | ICD-10-CM | POA: Diagnosis not present

## 2022-06-07 DIAGNOSIS — R1312 Dysphagia, oropharyngeal phase: Secondary | ICD-10-CM | POA: Diagnosis not present

## 2022-06-16 DIAGNOSIS — N390 Urinary tract infection, site not specified: Secondary | ICD-10-CM | POA: Diagnosis not present

## 2022-06-16 DIAGNOSIS — G9341 Metabolic encephalopathy: Secondary | ICD-10-CM | POA: Diagnosis not present

## 2022-06-16 DIAGNOSIS — I69320 Aphasia following cerebral infarction: Secondary | ICD-10-CM | POA: Diagnosis not present

## 2022-06-16 DIAGNOSIS — I69354 Hemiplegia and hemiparesis following cerebral infarction affecting left non-dominant side: Secondary | ICD-10-CM | POA: Diagnosis not present

## 2022-07-02 DIAGNOSIS — H35033 Hypertensive retinopathy, bilateral: Secondary | ICD-10-CM | POA: Diagnosis not present

## 2022-07-02 DIAGNOSIS — Z961 Presence of intraocular lens: Secondary | ICD-10-CM | POA: Diagnosis not present

## 2022-07-02 DIAGNOSIS — H401134 Primary open-angle glaucoma, bilateral, indeterminate stage: Secondary | ICD-10-CM | POA: Diagnosis not present

## 2022-07-08 DIAGNOSIS — I10 Essential (primary) hypertension: Secondary | ICD-10-CM | POA: Diagnosis not present

## 2022-07-08 DIAGNOSIS — I4891 Unspecified atrial fibrillation: Secondary | ICD-10-CM | POA: Diagnosis not present

## 2022-07-08 DIAGNOSIS — G4089 Other seizures: Secondary | ICD-10-CM | POA: Diagnosis not present

## 2022-07-08 DIAGNOSIS — R1312 Dysphagia, oropharyngeal phase: Secondary | ICD-10-CM | POA: Diagnosis not present

## 2022-07-21 DIAGNOSIS — I69320 Aphasia following cerebral infarction: Secondary | ICD-10-CM | POA: Diagnosis not present

## 2022-07-21 DIAGNOSIS — G9341 Metabolic encephalopathy: Secondary | ICD-10-CM | POA: Diagnosis not present

## 2022-07-21 DIAGNOSIS — I69354 Hemiplegia and hemiparesis following cerebral infarction affecting left non-dominant side: Secondary | ICD-10-CM | POA: Diagnosis not present

## 2022-07-21 DIAGNOSIS — N390 Urinary tract infection, site not specified: Secondary | ICD-10-CM | POA: Diagnosis not present

## 2022-07-22 DIAGNOSIS — G9341 Metabolic encephalopathy: Secondary | ICD-10-CM | POA: Diagnosis not present

## 2022-07-22 DIAGNOSIS — I69354 Hemiplegia and hemiparesis following cerebral infarction affecting left non-dominant side: Secondary | ICD-10-CM | POA: Diagnosis not present

## 2022-07-26 DIAGNOSIS — R1312 Dysphagia, oropharyngeal phase: Secondary | ICD-10-CM | POA: Diagnosis not present

## 2022-07-26 DIAGNOSIS — I10 Essential (primary) hypertension: Secondary | ICD-10-CM | POA: Diagnosis not present

## 2022-07-26 DIAGNOSIS — I4891 Unspecified atrial fibrillation: Secondary | ICD-10-CM | POA: Diagnosis not present

## 2022-08-03 DIAGNOSIS — I70209 Unspecified atherosclerosis of native arteries of extremities, unspecified extremity: Secondary | ICD-10-CM | POA: Diagnosis not present

## 2022-08-03 DIAGNOSIS — B351 Tinea unguium: Secondary | ICD-10-CM | POA: Diagnosis not present

## 2022-08-07 DIAGNOSIS — I4891 Unspecified atrial fibrillation: Secondary | ICD-10-CM | POA: Diagnosis not present

## 2022-08-07 DIAGNOSIS — R1312 Dysphagia, oropharyngeal phase: Secondary | ICD-10-CM | POA: Diagnosis not present

## 2022-08-07 DIAGNOSIS — G4089 Other seizures: Secondary | ICD-10-CM | POA: Diagnosis not present

## 2022-08-07 DIAGNOSIS — I10 Essential (primary) hypertension: Secondary | ICD-10-CM | POA: Diagnosis not present

## 2022-08-14 DIAGNOSIS — R410 Disorientation, unspecified: Secondary | ICD-10-CM | POA: Diagnosis not present

## 2022-08-14 DIAGNOSIS — G309 Alzheimer's disease, unspecified: Secondary | ICD-10-CM | POA: Diagnosis not present

## 2022-08-25 DIAGNOSIS — I69354 Hemiplegia and hemiparesis following cerebral infarction affecting left non-dominant side: Secondary | ICD-10-CM | POA: Diagnosis not present

## 2022-08-25 DIAGNOSIS — G9341 Metabolic encephalopathy: Secondary | ICD-10-CM | POA: Diagnosis not present

## 2022-08-25 DIAGNOSIS — N390 Urinary tract infection, site not specified: Secondary | ICD-10-CM | POA: Diagnosis not present

## 2022-08-25 DIAGNOSIS — I10 Essential (primary) hypertension: Secondary | ICD-10-CM | POA: Diagnosis not present

## 2022-09-07 DIAGNOSIS — I4891 Unspecified atrial fibrillation: Secondary | ICD-10-CM | POA: Diagnosis not present

## 2022-09-07 DIAGNOSIS — G4089 Other seizures: Secondary | ICD-10-CM | POA: Diagnosis not present

## 2022-09-07 DIAGNOSIS — R1312 Dysphagia, oropharyngeal phase: Secondary | ICD-10-CM | POA: Diagnosis not present

## 2022-09-07 DIAGNOSIS — I10 Essential (primary) hypertension: Secondary | ICD-10-CM | POA: Diagnosis not present

## 2022-09-22 DIAGNOSIS — G9341 Metabolic encephalopathy: Secondary | ICD-10-CM | POA: Diagnosis not present

## 2022-09-22 DIAGNOSIS — N390 Urinary tract infection, site not specified: Secondary | ICD-10-CM | POA: Diagnosis not present

## 2022-09-22 DIAGNOSIS — I69354 Hemiplegia and hemiparesis following cerebral infarction affecting left non-dominant side: Secondary | ICD-10-CM | POA: Diagnosis not present

## 2022-09-22 DIAGNOSIS — I69320 Aphasia following cerebral infarction: Secondary | ICD-10-CM | POA: Diagnosis not present

## 2022-10-04 DIAGNOSIS — I70209 Unspecified atherosclerosis of native arteries of extremities, unspecified extremity: Secondary | ICD-10-CM | POA: Diagnosis not present

## 2022-10-04 DIAGNOSIS — B351 Tinea unguium: Secondary | ICD-10-CM | POA: Diagnosis not present

## 2022-10-07 DIAGNOSIS — I4891 Unspecified atrial fibrillation: Secondary | ICD-10-CM | POA: Diagnosis not present

## 2022-10-07 DIAGNOSIS — I10 Essential (primary) hypertension: Secondary | ICD-10-CM | POA: Diagnosis not present

## 2022-10-07 DIAGNOSIS — G4089 Other seizures: Secondary | ICD-10-CM | POA: Diagnosis not present

## 2022-10-07 DIAGNOSIS — R1312 Dysphagia, oropharyngeal phase: Secondary | ICD-10-CM | POA: Diagnosis not present

## 2022-10-29 DIAGNOSIS — N390 Urinary tract infection, site not specified: Secondary | ICD-10-CM | POA: Diagnosis not present

## 2022-10-29 DIAGNOSIS — I69320 Aphasia following cerebral infarction: Secondary | ICD-10-CM | POA: Diagnosis not present

## 2022-10-29 DIAGNOSIS — G9341 Metabolic encephalopathy: Secondary | ICD-10-CM | POA: Diagnosis not present

## 2022-10-29 DIAGNOSIS — I69354 Hemiplegia and hemiparesis following cerebral infarction affecting left non-dominant side: Secondary | ICD-10-CM | POA: Diagnosis not present

## 2022-11-04 DIAGNOSIS — Z961 Presence of intraocular lens: Secondary | ICD-10-CM | POA: Diagnosis not present

## 2022-11-04 DIAGNOSIS — H35033 Hypertensive retinopathy, bilateral: Secondary | ICD-10-CM | POA: Diagnosis not present

## 2022-11-04 DIAGNOSIS — H401134 Primary open-angle glaucoma, bilateral, indeterminate stage: Secondary | ICD-10-CM | POA: Diagnosis not present

## 2022-11-07 DIAGNOSIS — G4089 Other seizures: Secondary | ICD-10-CM | POA: Diagnosis not present

## 2022-11-07 DIAGNOSIS — I10 Essential (primary) hypertension: Secondary | ICD-10-CM | POA: Diagnosis not present

## 2022-11-07 DIAGNOSIS — I4891 Unspecified atrial fibrillation: Secondary | ICD-10-CM | POA: Diagnosis not present

## 2022-11-07 DIAGNOSIS — R1312 Dysphagia, oropharyngeal phase: Secondary | ICD-10-CM | POA: Diagnosis not present

## 2022-11-08 DIAGNOSIS — R410 Disorientation, unspecified: Secondary | ICD-10-CM | POA: Diagnosis not present

## 2022-11-08 DIAGNOSIS — G309 Alzheimer's disease, unspecified: Secondary | ICD-10-CM | POA: Diagnosis not present

## 2022-12-03 DIAGNOSIS — I69354 Hemiplegia and hemiparesis following cerebral infarction affecting left non-dominant side: Secondary | ICD-10-CM | POA: Diagnosis not present

## 2022-12-03 DIAGNOSIS — N390 Urinary tract infection, site not specified: Secondary | ICD-10-CM | POA: Diagnosis not present

## 2022-12-03 DIAGNOSIS — I10 Essential (primary) hypertension: Secondary | ICD-10-CM | POA: Diagnosis not present

## 2022-12-03 DIAGNOSIS — G9341 Metabolic encephalopathy: Secondary | ICD-10-CM | POA: Diagnosis not present

## 2022-12-06 DIAGNOSIS — B351 Tinea unguium: Secondary | ICD-10-CM | POA: Diagnosis not present

## 2022-12-06 DIAGNOSIS — I70209 Unspecified atherosclerosis of native arteries of extremities, unspecified extremity: Secondary | ICD-10-CM | POA: Diagnosis not present

## 2023-01-05 DIAGNOSIS — G9341 Metabolic encephalopathy: Secondary | ICD-10-CM | POA: Diagnosis not present

## 2023-01-05 DIAGNOSIS — I69320 Aphasia following cerebral infarction: Secondary | ICD-10-CM | POA: Diagnosis not present

## 2023-01-05 DIAGNOSIS — I69354 Hemiplegia and hemiparesis following cerebral infarction affecting left non-dominant side: Secondary | ICD-10-CM | POA: Diagnosis not present

## 2023-01-05 DIAGNOSIS — N390 Urinary tract infection, site not specified: Secondary | ICD-10-CM | POA: Diagnosis not present

## 2023-01-12 DIAGNOSIS — R410 Disorientation, unspecified: Secondary | ICD-10-CM | POA: Diagnosis not present

## 2023-01-12 DIAGNOSIS — G309 Alzheimer's disease, unspecified: Secondary | ICD-10-CM | POA: Diagnosis not present

## 2023-01-17 DIAGNOSIS — I69320 Aphasia following cerebral infarction: Secondary | ICD-10-CM | POA: Diagnosis not present

## 2023-01-17 DIAGNOSIS — R1312 Dysphagia, oropharyngeal phase: Secondary | ICD-10-CM | POA: Diagnosis not present

## 2023-01-17 DIAGNOSIS — M6281 Muscle weakness (generalized): Secondary | ICD-10-CM | POA: Diagnosis not present

## 2023-01-17 DIAGNOSIS — I10 Essential (primary) hypertension: Secondary | ICD-10-CM | POA: Diagnosis not present

## 2023-02-07 DIAGNOSIS — B351 Tinea unguium: Secondary | ICD-10-CM | POA: Diagnosis not present

## 2023-02-07 DIAGNOSIS — I70209 Unspecified atherosclerosis of native arteries of extremities, unspecified extremity: Secondary | ICD-10-CM | POA: Diagnosis not present

## 2023-02-16 DIAGNOSIS — H35033 Hypertensive retinopathy, bilateral: Secondary | ICD-10-CM | POA: Diagnosis not present

## 2023-02-16 DIAGNOSIS — H401134 Primary open-angle glaucoma, bilateral, indeterminate stage: Secondary | ICD-10-CM | POA: Diagnosis not present

## 2023-02-16 DIAGNOSIS — Z961 Presence of intraocular lens: Secondary | ICD-10-CM | POA: Diagnosis not present

## 2023-03-08 DIAGNOSIS — R1312 Dysphagia, oropharyngeal phase: Secondary | ICD-10-CM | POA: Diagnosis not present

## 2023-03-08 DIAGNOSIS — I6930 Unspecified sequelae of cerebral infarction: Secondary | ICD-10-CM | POA: Diagnosis not present

## 2023-03-08 DIAGNOSIS — G4089 Other seizures: Secondary | ICD-10-CM | POA: Diagnosis not present

## 2023-03-08 DIAGNOSIS — I69354 Hemiplegia and hemiparesis following cerebral infarction affecting left non-dominant side: Secondary | ICD-10-CM | POA: Diagnosis not present

## 2023-03-13 DIAGNOSIS — R410 Disorientation, unspecified: Secondary | ICD-10-CM | POA: Diagnosis not present

## 2023-03-13 DIAGNOSIS — G309 Alzheimer's disease, unspecified: Secondary | ICD-10-CM | POA: Diagnosis not present

## 2023-03-29 DIAGNOSIS — I679 Cerebrovascular disease, unspecified: Secondary | ICD-10-CM | POA: Diagnosis not present

## 2023-03-29 DIAGNOSIS — G4089 Other seizures: Secondary | ICD-10-CM | POA: Diagnosis not present

## 2023-03-29 DIAGNOSIS — I69354 Hemiplegia and hemiparesis following cerebral infarction affecting left non-dominant side: Secondary | ICD-10-CM | POA: Diagnosis not present

## 2023-03-29 DIAGNOSIS — I6930 Unspecified sequelae of cerebral infarction: Secondary | ICD-10-CM | POA: Diagnosis not present

## 2023-03-29 DIAGNOSIS — R1312 Dysphagia, oropharyngeal phase: Secondary | ICD-10-CM | POA: Diagnosis not present

## 2023-04-11 DIAGNOSIS — I70209 Unspecified atherosclerosis of native arteries of extremities, unspecified extremity: Secondary | ICD-10-CM | POA: Diagnosis not present

## 2023-04-11 DIAGNOSIS — B351 Tinea unguium: Secondary | ICD-10-CM | POA: Diagnosis not present

## 2023-05-03 DIAGNOSIS — R1312 Dysphagia, oropharyngeal phase: Secondary | ICD-10-CM | POA: Diagnosis not present

## 2023-05-03 DIAGNOSIS — I69354 Hemiplegia and hemiparesis following cerebral infarction affecting left non-dominant side: Secondary | ICD-10-CM | POA: Diagnosis not present

## 2023-05-03 DIAGNOSIS — I6930 Unspecified sequelae of cerebral infarction: Secondary | ICD-10-CM | POA: Diagnosis not present

## 2023-05-03 DIAGNOSIS — G4089 Other seizures: Secondary | ICD-10-CM | POA: Diagnosis not present

## 2023-05-10 DIAGNOSIS — G4089 Other seizures: Secondary | ICD-10-CM | POA: Diagnosis not present

## 2023-05-10 DIAGNOSIS — I69354 Hemiplegia and hemiparesis following cerebral infarction affecting left non-dominant side: Secondary | ICD-10-CM | POA: Diagnosis not present

## 2023-05-10 DIAGNOSIS — R1312 Dysphagia, oropharyngeal phase: Secondary | ICD-10-CM | POA: Diagnosis not present

## 2023-05-10 DIAGNOSIS — I6930 Unspecified sequelae of cerebral infarction: Secondary | ICD-10-CM | POA: Diagnosis not present

## 2023-05-15 DIAGNOSIS — G309 Alzheimer's disease, unspecified: Secondary | ICD-10-CM | POA: Diagnosis not present

## 2023-05-15 DIAGNOSIS — R410 Disorientation, unspecified: Secondary | ICD-10-CM | POA: Diagnosis not present

## 2023-05-23 DIAGNOSIS — I69354 Hemiplegia and hemiparesis following cerebral infarction affecting left non-dominant side: Secondary | ICD-10-CM | POA: Diagnosis not present

## 2023-05-23 DIAGNOSIS — R1312 Dysphagia, oropharyngeal phase: Secondary | ICD-10-CM | POA: Diagnosis not present

## 2023-05-23 DIAGNOSIS — I6930 Unspecified sequelae of cerebral infarction: Secondary | ICD-10-CM | POA: Diagnosis not present

## 2023-05-23 DIAGNOSIS — G4089 Other seizures: Secondary | ICD-10-CM | POA: Diagnosis not present

## 2023-05-31 DIAGNOSIS — G4089 Other seizures: Secondary | ICD-10-CM | POA: Diagnosis not present

## 2023-05-31 DIAGNOSIS — I69354 Hemiplegia and hemiparesis following cerebral infarction affecting left non-dominant side: Secondary | ICD-10-CM | POA: Diagnosis not present

## 2023-05-31 DIAGNOSIS — I6930 Unspecified sequelae of cerebral infarction: Secondary | ICD-10-CM | POA: Diagnosis not present

## 2023-05-31 DIAGNOSIS — R1312 Dysphagia, oropharyngeal phase: Secondary | ICD-10-CM | POA: Diagnosis not present

## 2023-06-14 DIAGNOSIS — B351 Tinea unguium: Secondary | ICD-10-CM | POA: Diagnosis not present

## 2023-06-14 DIAGNOSIS — I70209 Unspecified atherosclerosis of native arteries of extremities, unspecified extremity: Secondary | ICD-10-CM | POA: Diagnosis not present

## 2023-06-23 DIAGNOSIS — I69354 Hemiplegia and hemiparesis following cerebral infarction affecting left non-dominant side: Secondary | ICD-10-CM | POA: Diagnosis not present

## 2023-06-23 DIAGNOSIS — R1312 Dysphagia, oropharyngeal phase: Secondary | ICD-10-CM | POA: Diagnosis not present

## 2023-06-23 DIAGNOSIS — G4089 Other seizures: Secondary | ICD-10-CM | POA: Diagnosis not present

## 2023-06-23 DIAGNOSIS — I6930 Unspecified sequelae of cerebral infarction: Secondary | ICD-10-CM | POA: Diagnosis not present

## 2023-07-25 DIAGNOSIS — G4089 Other seizures: Secondary | ICD-10-CM | POA: Diagnosis not present

## 2023-07-25 DIAGNOSIS — R1312 Dysphagia, oropharyngeal phase: Secondary | ICD-10-CM | POA: Diagnosis not present

## 2023-07-25 DIAGNOSIS — I69354 Hemiplegia and hemiparesis following cerebral infarction affecting left non-dominant side: Secondary | ICD-10-CM | POA: Diagnosis not present

## 2023-07-25 DIAGNOSIS — I6930 Unspecified sequelae of cerebral infarction: Secondary | ICD-10-CM | POA: Diagnosis not present

## 2023-07-26 DIAGNOSIS — H401134 Primary open-angle glaucoma, bilateral, indeterminate stage: Secondary | ICD-10-CM | POA: Diagnosis not present

## 2023-07-26 DIAGNOSIS — Z961 Presence of intraocular lens: Secondary | ICD-10-CM | POA: Diagnosis not present

## 2023-07-26 DIAGNOSIS — H35033 Hypertensive retinopathy, bilateral: Secondary | ICD-10-CM | POA: Diagnosis not present

## 2023-07-30 DIAGNOSIS — R410 Disorientation, unspecified: Secondary | ICD-10-CM | POA: Diagnosis not present

## 2023-07-30 DIAGNOSIS — G309 Alzheimer's disease, unspecified: Secondary | ICD-10-CM | POA: Diagnosis not present

## 2023-08-22 DIAGNOSIS — B351 Tinea unguium: Secondary | ICD-10-CM | POA: Diagnosis not present

## 2023-08-22 DIAGNOSIS — I70209 Unspecified atherosclerosis of native arteries of extremities, unspecified extremity: Secondary | ICD-10-CM | POA: Diagnosis not present

## 2023-08-26 DIAGNOSIS — G4089 Other seizures: Secondary | ICD-10-CM | POA: Diagnosis not present

## 2023-08-26 DIAGNOSIS — I69354 Hemiplegia and hemiparesis following cerebral infarction affecting left non-dominant side: Secondary | ICD-10-CM | POA: Diagnosis not present

## 2023-08-26 DIAGNOSIS — R1312 Dysphagia, oropharyngeal phase: Secondary | ICD-10-CM | POA: Diagnosis not present

## 2023-08-26 DIAGNOSIS — I6930 Unspecified sequelae of cerebral infarction: Secondary | ICD-10-CM | POA: Diagnosis not present

## 2023-09-02 DIAGNOSIS — I639 Cerebral infarction, unspecified: Secondary | ICD-10-CM | POA: Diagnosis not present

## 2023-09-02 DIAGNOSIS — I4891 Unspecified atrial fibrillation: Secondary | ICD-10-CM | POA: Diagnosis not present

## 2023-09-09 DIAGNOSIS — I4891 Unspecified atrial fibrillation: Secondary | ICD-10-CM | POA: Diagnosis not present

## 2023-09-09 DIAGNOSIS — I639 Cerebral infarction, unspecified: Secondary | ICD-10-CM | POA: Diagnosis not present

## 2023-09-23 DIAGNOSIS — R052 Subacute cough: Secondary | ICD-10-CM | POA: Diagnosis not present

## 2023-10-10 DIAGNOSIS — R052 Subacute cough: Secondary | ICD-10-CM | POA: Diagnosis not present

## 2023-10-24 DIAGNOSIS — I70209 Unspecified atherosclerosis of native arteries of extremities, unspecified extremity: Secondary | ICD-10-CM | POA: Diagnosis not present

## 2023-10-24 DIAGNOSIS — B351 Tinea unguium: Secondary | ICD-10-CM | POA: Diagnosis not present

## 2023-10-25 DIAGNOSIS — I6930 Unspecified sequelae of cerebral infarction: Secondary | ICD-10-CM | POA: Diagnosis not present

## 2023-10-25 DIAGNOSIS — R1312 Dysphagia, oropharyngeal phase: Secondary | ICD-10-CM | POA: Diagnosis not present

## 2023-10-25 DIAGNOSIS — I10 Essential (primary) hypertension: Secondary | ICD-10-CM | POA: Diagnosis not present

## 2023-10-25 DIAGNOSIS — I69354 Hemiplegia and hemiparesis following cerebral infarction affecting left non-dominant side: Secondary | ICD-10-CM | POA: Diagnosis not present

## 2023-10-25 DIAGNOSIS — E119 Type 2 diabetes mellitus without complications: Secondary | ICD-10-CM | POA: Diagnosis not present

## 2023-10-25 DIAGNOSIS — G4089 Other seizures: Secondary | ICD-10-CM | POA: Diagnosis not present

## 2023-10-25 DIAGNOSIS — E0800 Diabetes mellitus due to underlying condition with hyperosmolarity without nonketotic hyperglycemic-hyperosmolar coma (NKHHC): Secondary | ICD-10-CM | POA: Diagnosis not present

## 2023-10-25 DIAGNOSIS — R5383 Other fatigue: Secondary | ICD-10-CM | POA: Diagnosis not present

## 2023-11-04 DIAGNOSIS — G9341 Metabolic encephalopathy: Secondary | ICD-10-CM | POA: Diagnosis not present

## 2023-11-04 DIAGNOSIS — I69354 Hemiplegia and hemiparesis following cerebral infarction affecting left non-dominant side: Secondary | ICD-10-CM | POA: Diagnosis not present

## 2023-11-04 DIAGNOSIS — M6281 Muscle weakness (generalized): Secondary | ICD-10-CM | POA: Diagnosis not present

## 2023-11-04 DIAGNOSIS — U071 COVID-19: Secondary | ICD-10-CM | POA: Diagnosis not present

## 2023-11-04 DIAGNOSIS — R262 Difficulty in walking, not elsewhere classified: Secondary | ICD-10-CM | POA: Diagnosis not present

## 2023-11-04 DIAGNOSIS — G4089 Other seizures: Secondary | ICD-10-CM | POA: Diagnosis not present

## 2023-11-07 DIAGNOSIS — R140 Abdominal distension (gaseous): Secondary | ICD-10-CM | POA: Diagnosis not present

## 2023-11-07 DIAGNOSIS — R1930 Abdominal rigidity, unspecified site: Secondary | ICD-10-CM | POA: Diagnosis not present

## 2023-11-07 DIAGNOSIS — G4089 Other seizures: Secondary | ICD-10-CM | POA: Diagnosis not present

## 2023-11-07 DIAGNOSIS — G9341 Metabolic encephalopathy: Secondary | ICD-10-CM | POA: Diagnosis not present

## 2023-11-07 DIAGNOSIS — M6281 Muscle weakness (generalized): Secondary | ICD-10-CM | POA: Diagnosis not present

## 2023-11-07 DIAGNOSIS — R262 Difficulty in walking, not elsewhere classified: Secondary | ICD-10-CM | POA: Diagnosis not present

## 2023-11-07 DIAGNOSIS — U071 COVID-19: Secondary | ICD-10-CM | POA: Diagnosis not present

## 2023-11-07 DIAGNOSIS — R109 Unspecified abdominal pain: Secondary | ICD-10-CM | POA: Diagnosis not present

## 2023-11-07 DIAGNOSIS — I69354 Hemiplegia and hemiparesis following cerebral infarction affecting left non-dominant side: Secondary | ICD-10-CM | POA: Diagnosis not present

## 2023-11-11 DIAGNOSIS — I69354 Hemiplegia and hemiparesis following cerebral infarction affecting left non-dominant side: Secondary | ICD-10-CM | POA: Diagnosis not present

## 2023-11-11 DIAGNOSIS — R262 Difficulty in walking, not elsewhere classified: Secondary | ICD-10-CM | POA: Diagnosis not present

## 2023-11-11 DIAGNOSIS — I6930 Unspecified sequelae of cerebral infarction: Secondary | ICD-10-CM | POA: Diagnosis not present

## 2023-11-11 DIAGNOSIS — G4089 Other seizures: Secondary | ICD-10-CM | POA: Diagnosis not present

## 2023-11-11 DIAGNOSIS — R1312 Dysphagia, oropharyngeal phase: Secondary | ICD-10-CM | POA: Diagnosis not present

## 2023-11-11 DIAGNOSIS — G9341 Metabolic encephalopathy: Secondary | ICD-10-CM | POA: Diagnosis not present

## 2023-11-11 DIAGNOSIS — U071 COVID-19: Secondary | ICD-10-CM | POA: Diagnosis not present

## 2023-11-11 DIAGNOSIS — M6281 Muscle weakness (generalized): Secondary | ICD-10-CM | POA: Diagnosis not present

## 2023-11-15 DIAGNOSIS — U071 COVID-19: Secondary | ICD-10-CM | POA: Diagnosis not present

## 2023-11-15 DIAGNOSIS — M6281 Muscle weakness (generalized): Secondary | ICD-10-CM | POA: Diagnosis not present

## 2023-11-15 DIAGNOSIS — G9341 Metabolic encephalopathy: Secondary | ICD-10-CM | POA: Diagnosis not present

## 2023-11-15 DIAGNOSIS — G4089 Other seizures: Secondary | ICD-10-CM | POA: Diagnosis not present

## 2023-11-15 DIAGNOSIS — R262 Difficulty in walking, not elsewhere classified: Secondary | ICD-10-CM | POA: Diagnosis not present

## 2023-11-15 DIAGNOSIS — I69354 Hemiplegia and hemiparesis following cerebral infarction affecting left non-dominant side: Secondary | ICD-10-CM | POA: Diagnosis not present

## 2023-11-18 DIAGNOSIS — R262 Difficulty in walking, not elsewhere classified: Secondary | ICD-10-CM | POA: Diagnosis not present

## 2023-11-18 DIAGNOSIS — I69354 Hemiplegia and hemiparesis following cerebral infarction affecting left non-dominant side: Secondary | ICD-10-CM | POA: Diagnosis not present

## 2023-11-18 DIAGNOSIS — G4089 Other seizures: Secondary | ICD-10-CM | POA: Diagnosis not present

## 2023-11-18 DIAGNOSIS — U071 COVID-19: Secondary | ICD-10-CM | POA: Diagnosis not present

## 2023-11-18 DIAGNOSIS — H35033 Hypertensive retinopathy, bilateral: Secondary | ICD-10-CM | POA: Diagnosis not present

## 2023-11-18 DIAGNOSIS — M6281 Muscle weakness (generalized): Secondary | ICD-10-CM | POA: Diagnosis not present

## 2023-11-18 DIAGNOSIS — Z961 Presence of intraocular lens: Secondary | ICD-10-CM | POA: Diagnosis not present

## 2023-11-18 DIAGNOSIS — G9341 Metabolic encephalopathy: Secondary | ICD-10-CM | POA: Diagnosis not present

## 2023-11-18 DIAGNOSIS — H401134 Primary open-angle glaucoma, bilateral, indeterminate stage: Secondary | ICD-10-CM | POA: Diagnosis not present

## 2023-11-22 DIAGNOSIS — M6281 Muscle weakness (generalized): Secondary | ICD-10-CM | POA: Diagnosis not present

## 2023-11-22 DIAGNOSIS — G9341 Metabolic encephalopathy: Secondary | ICD-10-CM | POA: Diagnosis not present

## 2023-11-22 DIAGNOSIS — G4089 Other seizures: Secondary | ICD-10-CM | POA: Diagnosis not present

## 2023-11-22 DIAGNOSIS — R262 Difficulty in walking, not elsewhere classified: Secondary | ICD-10-CM | POA: Diagnosis not present

## 2023-11-22 DIAGNOSIS — U071 COVID-19: Secondary | ICD-10-CM | POA: Diagnosis not present

## 2023-11-22 DIAGNOSIS — I69354 Hemiplegia and hemiparesis following cerebral infarction affecting left non-dominant side: Secondary | ICD-10-CM | POA: Diagnosis not present

## 2023-11-25 DIAGNOSIS — U071 COVID-19: Secondary | ICD-10-CM | POA: Diagnosis not present

## 2023-11-25 DIAGNOSIS — R1312 Dysphagia, oropharyngeal phase: Secondary | ICD-10-CM | POA: Diagnosis not present

## 2023-11-25 DIAGNOSIS — G4089 Other seizures: Secondary | ICD-10-CM | POA: Diagnosis not present

## 2023-11-25 DIAGNOSIS — I69354 Hemiplegia and hemiparesis following cerebral infarction affecting left non-dominant side: Secondary | ICD-10-CM | POA: Diagnosis not present

## 2023-11-25 DIAGNOSIS — I6930 Unspecified sequelae of cerebral infarction: Secondary | ICD-10-CM | POA: Diagnosis not present

## 2023-11-25 DIAGNOSIS — R262 Difficulty in walking, not elsewhere classified: Secondary | ICD-10-CM | POA: Diagnosis not present

## 2023-11-25 DIAGNOSIS — M6281 Muscle weakness (generalized): Secondary | ICD-10-CM | POA: Diagnosis not present

## 2023-11-25 DIAGNOSIS — G9341 Metabolic encephalopathy: Secondary | ICD-10-CM | POA: Diagnosis not present
